# Patient Record
Sex: Female | Born: 1965 | Hispanic: No | Marital: Married | Smoking: Never smoker
Health system: Southern US, Community
[De-identification: ages and names within clinical notes are randomized; demographics above are authoritative.]

## PROBLEM LIST (undated history)

## (undated) DIAGNOSIS — N8500 Endometrial hyperplasia, unspecified: Secondary | ICD-10-CM

## (undated) DIAGNOSIS — J302 Other seasonal allergic rhinitis: Secondary | ICD-10-CM

## (undated) DIAGNOSIS — Z8742 Personal history of other diseases of the female genital tract: Secondary | ICD-10-CM

## (undated) DIAGNOSIS — O24419 Gestational diabetes mellitus in pregnancy, unspecified control: Secondary | ICD-10-CM

## (undated) DIAGNOSIS — Z803 Family history of malignant neoplasm of breast: Secondary | ICD-10-CM

## (undated) DIAGNOSIS — A184 Tuberculosis of skin and subcutaneous tissue: Secondary | ICD-10-CM

## (undated) HISTORY — DX: Tuberculosis of skin and subcutaneous tissue: A18.4

## (undated) HISTORY — DX: Family history of malignant neoplasm of breast: Z80.3

## (undated) HISTORY — DX: Personal history of other diseases of the female genital tract: Z87.42

## (undated) HISTORY — DX: Gestational diabetes mellitus in pregnancy, unspecified control: O24.419

## (undated) HISTORY — PX: DIAGNOSTIC LAPAROSCOPY: SUR761

## (undated) HISTORY — DX: Other seasonal allergic rhinitis: J30.2

## (undated) HISTORY — PX: TONSILLECTOMY: SUR1361

---

## 2003-06-24 ENCOUNTER — Encounter: Payer: Self-pay | Admitting: Internal Medicine

## 2004-02-05 ENCOUNTER — Ambulatory Visit: Payer: Self-pay | Admitting: Internal Medicine

## 2004-04-18 ENCOUNTER — Ambulatory Visit: Payer: Self-pay | Admitting: Internal Medicine

## 2004-10-04 ENCOUNTER — Encounter: Admission: RE | Admit: 2004-10-04 | Discharge: 2004-12-12 | Payer: Self-pay | Admitting: *Deleted

## 2004-12-11 ENCOUNTER — Inpatient Hospital Stay (HOSPITAL_COMMUNITY): Admission: AD | Admit: 2004-12-11 | Discharge: 2004-12-14 | Payer: Self-pay | Admitting: *Deleted

## 2005-02-15 ENCOUNTER — Ambulatory Visit: Payer: Self-pay | Admitting: Internal Medicine

## 2005-04-28 ENCOUNTER — Encounter: Admission: RE | Admit: 2005-04-28 | Discharge: 2005-04-28 | Payer: Self-pay | Admitting: Specialist

## 2005-05-05 ENCOUNTER — Ambulatory Visit: Payer: Self-pay | Admitting: Internal Medicine

## 2005-06-05 ENCOUNTER — Ambulatory Visit: Payer: Self-pay | Admitting: Internal Medicine

## 2005-10-04 ENCOUNTER — Ambulatory Visit: Payer: Self-pay | Admitting: Internal Medicine

## 2006-01-25 ENCOUNTER — Ambulatory Visit: Payer: Self-pay | Admitting: Internal Medicine

## 2006-02-24 ENCOUNTER — Emergency Department (HOSPITAL_COMMUNITY): Admission: EM | Admit: 2006-02-24 | Discharge: 2006-02-24 | Payer: Self-pay | Admitting: Emergency Medicine

## 2006-03-01 ENCOUNTER — Emergency Department (HOSPITAL_COMMUNITY): Admission: EM | Admit: 2006-03-01 | Discharge: 2006-03-01 | Payer: Self-pay | Admitting: Emergency Medicine

## 2006-08-22 ENCOUNTER — Encounter (INDEPENDENT_AMBULATORY_CARE_PROVIDER_SITE_OTHER): Payer: Self-pay | Admitting: Specialist

## 2006-08-22 ENCOUNTER — Inpatient Hospital Stay (HOSPITAL_COMMUNITY): Admission: AD | Admit: 2006-08-22 | Discharge: 2006-08-24 | Payer: Self-pay | Admitting: Obstetrics and Gynecology

## 2006-10-30 ENCOUNTER — Encounter: Payer: Self-pay | Admitting: Internal Medicine

## 2006-12-20 ENCOUNTER — Encounter: Payer: Self-pay | Admitting: Internal Medicine

## 2007-01-24 ENCOUNTER — Ambulatory Visit: Payer: Self-pay | Admitting: Internal Medicine

## 2007-09-23 ENCOUNTER — Ambulatory Visit (HOSPITAL_COMMUNITY): Admission: RE | Admit: 2007-09-23 | Discharge: 2007-09-23 | Payer: Self-pay | Admitting: Internal Medicine

## 2007-10-01 ENCOUNTER — Encounter: Admission: RE | Admit: 2007-10-01 | Discharge: 2007-10-01 | Payer: Self-pay | Admitting: Internal Medicine

## 2007-12-17 ENCOUNTER — Encounter: Payer: Self-pay | Admitting: Internal Medicine

## 2008-01-10 ENCOUNTER — Ambulatory Visit: Payer: Self-pay | Admitting: Internal Medicine

## 2008-01-15 ENCOUNTER — Telehealth: Payer: Self-pay | Admitting: Internal Medicine

## 2008-02-17 ENCOUNTER — Ambulatory Visit: Payer: Self-pay | Admitting: Internal Medicine

## 2008-02-17 DIAGNOSIS — R51 Headache: Secondary | ICD-10-CM

## 2008-02-17 DIAGNOSIS — R519 Headache, unspecified: Secondary | ICD-10-CM | POA: Insufficient documentation

## 2008-02-17 DIAGNOSIS — J309 Allergic rhinitis, unspecified: Secondary | ICD-10-CM | POA: Insufficient documentation

## 2008-02-17 DIAGNOSIS — N809 Endometriosis, unspecified: Secondary | ICD-10-CM | POA: Insufficient documentation

## 2008-02-17 DIAGNOSIS — Z8632 Personal history of gestational diabetes: Secondary | ICD-10-CM

## 2008-03-30 ENCOUNTER — Ambulatory Visit: Payer: Self-pay | Admitting: Internal Medicine

## 2008-03-30 DIAGNOSIS — K089 Disorder of teeth and supporting structures, unspecified: Secondary | ICD-10-CM | POA: Insufficient documentation

## 2008-03-30 DIAGNOSIS — J019 Acute sinusitis, unspecified: Secondary | ICD-10-CM

## 2008-03-30 DIAGNOSIS — J069 Acute upper respiratory infection, unspecified: Secondary | ICD-10-CM | POA: Insufficient documentation

## 2008-08-18 ENCOUNTER — Ambulatory Visit: Payer: Self-pay | Admitting: Internal Medicine

## 2008-11-05 ENCOUNTER — Encounter: Admission: RE | Admit: 2008-11-05 | Discharge: 2008-11-05 | Payer: Self-pay | Admitting: Internal Medicine

## 2009-01-12 ENCOUNTER — Ambulatory Visit: Payer: Self-pay | Admitting: Internal Medicine

## 2009-08-02 ENCOUNTER — Ambulatory Visit: Payer: Self-pay | Admitting: Internal Medicine

## 2010-01-19 ENCOUNTER — Encounter: Admission: RE | Admit: 2010-01-19 | Discharge: 2010-01-19 | Payer: Self-pay | Admitting: Obstetrics and Gynecology

## 2010-05-01 ENCOUNTER — Encounter: Payer: Self-pay | Admitting: Internal Medicine

## 2010-05-10 NOTE — Assessment & Plan Note (Signed)
Summary: allergies/ssc   Vital Signs:  Patient profile:   45 year old female Menstrual status:  regular LMP:     07/02/2009 Height:      69 inches Weight:      107.5 pounds BMI:     15.93 Temp:     98.7 degrees F oral Pulse rate:   78 / minute BP sitting:   100 / 60  (right arm) Cuff size:   regular  Vitals Entered By: Romualdo Bolk, CMA Duncan Dull) (August 02, 2009 3:41 PM) CC: Allergies- Sneezing, watery eyes LMP (date): 07/02/2009 LMP - Character: normal Menarche (age onset years): 15   Menses interval (days): 30 Enter LMP: 07/02/2009   History of Present Illness: Megan Armstrong comes in today  for  problems with allergies .     Sig upper resp problems  itchy and congestion and worse when outside.  tis spring.   Taking claritin  otc.   Hasnt taken nasonex since   in    [place].    Until called in  upon phone request and already seems to be  improved .    NOt too much coughing .    No asthma  Preventive Screening-Counseling & Management  Alcohol-Tobacco     Alcohol drinks/day: 0     Smoking Status: never  Caffeine-Diet-Exercise     Caffeine use/day: 1     Does Patient Exercise: yes  Current Medications (verified): 1)  Nasonex 50 Mcg/act Susp (Mometasone Furoate) .... 2 Sprays Each Nostril   Q D 2)  Claritin Reditabs 10 Mg Tbdp (Loratadine)  Allergies (verified): 1)  ! Pcn 2)  ! Tylenol 3)  ! Codeine  Past History:  Past medical, surgical, family and social histories (including risk factors) reviewed for relevance to current acute and chronic problems.  Past Medical History: Reviewed history from 03/30/2008 and no changes required. Endometriosis Allergic rhinitis Allergies Gest DM +TB Skin Test Borderline spirometry    Past Surgical History: Reviewed history from 01/03/2007 and no changes required. Tonsillectomy  Past History:  Care Management: Gynecology: [name]  Family History: Reviewed history from 03/30/2008 and no changes  required. Family History of Asthma Family History Diabetes 1st degree relative Family History Hypertension Family History of Sudden Death sister with migraines   Social History: Reviewed history from 03/30/2008 and no changes required. Married Never Smoked Alcohol use-no Drug use-no Regular exercise-yes decff tea        some coke.      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    Physical Exam  General:  alert, well-developed, well-nourished, and well-hydrated.   Head:  normocephalic and atraumatic.   Eyes:  vision grossly intact, pupils equal, and pupils round.   Ears:  R ear normal and no external deformities.   Nose:  no external deformity and no external erythema.  mild congestion Mouth:  pharynx pink and moist.   Neck:  No deformities, masses, or tenderness noted. Lungs:  Normal respiratory effort, chest expands symmetrically. Lungs are clear to auscultation, no crackles or wheezes.no dullness.   Heart:  Normal rate and regular rhythm. S1 and S2 normal without gallop, murmur, click, rub or other extra sounds. Abdomen:  Bowel sounds positive,abdomen soft and non-tender without masses, organomegaly  or   noted. Extremities:  no clubbing cyanosis or edema  Neurologic:  non focal  Skin:  turgor normal, color normal, no ecchymoses, and no petechiae.   Cervical Nodes:  No lymphadenopathy noted Psych:  Oriented X3, good eye contact, not anxious appearing, and not depressed appearing.     Impression & Recommendations:  Problem # 1:  ALLERGIC RHINITIS (ICD-477.9)  seasonal  improved with above.  continue    .    Her updated medication list for this problem includes:    Nasonex 50 Mcg/act Susp  (Mometasone furoate) .Marland Kitchen... 2 sprays each nostril   q d    Claritin Reditabs 10 Mg Tbdp (Loratadine)  Complete Medication List: 1)  Nasonex 50 Mcg/act Susp (Mometasone furoate) .... 2 sprays each nostril   q d 2)  Claritin Reditabs 10 Mg Tbdp (Loratadine)  Patient Instructions: 1)  continue rx  call  if  needed or as needed 1 year .   Prescriptions: CLARITIN REDITABS 10 MG TBDP (LORATADINE)   #30 x 6   Entered and Authorized by:   Madelin Headings MD   Signed by:   Madelin Headings MD on 08/02/2009   Method used:   Print then Give to Patient   RxID:   [id number] NASONEX 50 MCG/ACT SUSP (MOMETASONE FUROATE) 2 sprays each nostril   q d  #17 [place] x 12   Entered and Authorized by:   Madelin Headings MD   Signed by:   Madelin Headings MD on 08/02/2009   Method used:   Print then Give to Patient   RxID:   [id number]

## 2010-08-26 NOTE — Assessment & Plan Note (Signed)
[facility]                                 ON-CALL NOTE   Megan Armstrong, Megan Armstrong                     MRN:          [id number]  DATE:04/01/2006                            DOB:          [birth year]    PHONE NUMBER:  [phone]   PATIENT DOCTOR:  Dr. Fabian Sharp.   The phone call was at about 9:10 a.m. on December 23.  She is [redacted] weeks  pregnant and has been having a cold for the last 4 days herself.  She  has been just taking some Benadryl.  Now she is feeling a little bit  worse, she has had a low grade fever about 100.4.  She is also coughing  up some yellow stuff, she is not really short of breath.   PLAN:  I told her that we would need to have an evaluation before any  treatment could be started, she understood that.  She was not sure she  needed that today.  I told her an urgent care would be appropriate for  today if she could not wait until office hours tomorrow.     Karie Schwalbe, MD  Electronically Signed    RIL/MedQ  DD: 04/01/2006  DT: 04/01/2006  Job #: [id number]

## 2010-11-07 ENCOUNTER — Ambulatory Visit (INDEPENDENT_AMBULATORY_CARE_PROVIDER_SITE_OTHER)
Admission: RE | Admit: 2010-11-07 | Discharge: 2010-11-07 | Disposition: A | Discharge status: Home / Self Care | Payer: Managed Care, Other (non HMO) | Source: Ambulatory Visit | Attending: Internal Medicine | Admitting: Internal Medicine

## 2010-11-07 ENCOUNTER — Encounter: Payer: Self-pay | Admitting: Internal Medicine

## 2010-11-07 ENCOUNTER — Telehealth: Payer: Self-pay | Admitting: *Deleted

## 2010-11-07 ENCOUNTER — Ambulatory Visit (INDEPENDENT_AMBULATORY_CARE_PROVIDER_SITE_OTHER): Payer: Managed Care, Other (non HMO) | Admitting: Internal Medicine

## 2010-11-07 DIAGNOSIS — R509 Fever, unspecified: Secondary | ICD-10-CM

## 2010-11-07 DIAGNOSIS — R05 Cough: Secondary | ICD-10-CM | POA: Insufficient documentation

## 2010-11-07 LAB — CBC WITH DIFFERENTIAL/PLATELET
Basophils Absolute: 0 10*3/uL (ref 0.0–0.1)
Eosinophils Absolute: 0 10*3/uL (ref 0.0–0.7)
Hemoglobin: 13.6 g/dL (ref 12.0–15.0)
Lymphocytes Relative: 26.3 % (ref 12.0–46.0)
Lymphs Abs: 2.1 10*3/uL (ref 0.7–4.0)
MCHC: 30.8 g/dL (ref 30.0–36.0)
Neutro Abs: 5 10*3/uL (ref 1.4–7.7)
Platelets: 290 10*3/uL (ref 150.0–400.0)
RDW: 13.6 % (ref 11.5–14.6)

## 2010-11-07 MED ORDER — DOXYCYCLINE HYCLATE 100 MG PO TABS: 100.0000 mg | ORAL_TABLET | Freq: Two times a day (BID) | ORAL | Status: AC

## 2010-11-07 NOTE — Telephone Encounter (Signed)
Left message to call back  

## 2010-11-07 NOTE — Telephone Encounter (Signed)
Message copied by Romualdo Bolk on Mon Nov 07, 2010  5:18 PM ------      Message from: [facility], [place] K      Created: Mon Nov 07, 2010  5:13 PM       Tell patient x ray is normal

## 2010-11-07 NOTE — Patient Instructions (Signed)
Get xray  To r/o pneumonia. Begin DOXY  To cover  atypicals and tick related disease. Expect fever    To resolve in  48 hours.

## 2010-11-07 NOTE — Telephone Encounter (Signed)
Pt called after hr ans service - informed pt xray normal per dr. Fabian Sharp

## 2010-11-07 NOTE — Progress Notes (Signed)
  Subjective:    Patient ID: Megan Armstrong, female    DOB: 11-Apr-1965, 45 y.o.   MRN: [id number]  HPI Comesin with husband today because of acute febrile iless. Onset  As above onset with HA 7 26 then fever and midl dry cough. No travel except  [facility].      Onset with HA and worse with bending and up   And cough dry. And thenito front and then fever. had to go to bed with  Myalgias   and bed rest . Chills no rigors Slight PND.  Queasiness no vomiting and stool slimy green solid. Foul smelling.  No rashes  No joint swelling.   Took ibu  And advil migraine.  No Ha now.     No  Tick bite known or rash  Review of Systems    Neg cp sob vision change e adenopathy VD syncope   Past history family history social history reviewed in the electronic medical record.  Objective:   Physical Exam WDWN in nad midly ill non toxic  HEENT: Normocephalic ;atraumatic , Eyes;  PERRL, EOMs  Full, lids and conjunctiva clear,,Ears: no deformities, canals nl, TM landmarks normal, Nose: no deformity or discharge  Mild congestion Mouth : OP clear without lesion or edema . Neck supple no sig adenopathy. Chest:  Clear to A&P without wheezes rales or rhonchi CV:  S1-S2 no gallops or murmurs peripheral perfusion is normal Abdomen:  Sof,t normal bowel sounds without hepatosplenomegaly, no guarding rebound or masses no CVA tenderness No clubbing cyanosis or edema No acute rash nl cap refill Neuro non focal.      Assessment & Plan:  Fever ha and resp sx   Poss entero/adeno  virus other  Check chest x ray  and cbc and cover for tick related  Disease cause is day 3 of fever.     Expectant management. And relate labs when back.Marland Kitchen

## 2010-11-08 ENCOUNTER — Telehealth: Payer: Self-pay | Admitting: *Deleted

## 2010-11-08 NOTE — Telephone Encounter (Signed)
Pt aware of results 

## 2010-11-08 NOTE — Telephone Encounter (Signed)
Message copied by Romualdo Bolk on Tue Nov 08, 2010  2:01 PM ------      Message from: [facility], [place] K      Created: Tue Nov 08, 2010 12:48 PM       Call pt with nl cbc results

## 2010-11-08 NOTE — Telephone Encounter (Signed)
Left message to call back  

## 2010-11-10 ENCOUNTER — Encounter: Payer: Self-pay | Admitting: Internal Medicine

## 2011-02-07 ENCOUNTER — Ambulatory Visit (INDEPENDENT_AMBULATORY_CARE_PROVIDER_SITE_OTHER): Payer: Managed Care, Other (non HMO) | Admitting: Internal Medicine

## 2011-02-07 DIAGNOSIS — Z23 Encounter for immunization: Secondary | ICD-10-CM

## 2011-02-27 ENCOUNTER — Other Ambulatory Visit: Payer: Self-pay | Admitting: Obstetrics and Gynecology

## 2011-02-27 DIAGNOSIS — Z1231 Encounter for screening mammogram for malignant neoplasm of breast: Secondary | ICD-10-CM

## 2011-03-23 ENCOUNTER — Ambulatory Visit: Payer: Managed Care, Other (non HMO)

## 2011-03-31 ENCOUNTER — Ambulatory Visit
Admission: RE | Admit: 2011-03-31 | Discharge: 2011-03-31 | Disposition: A | Discharge status: Home / Self Care | Payer: Commercial Indemnity | Source: Ambulatory Visit | Attending: Obstetrics and Gynecology | Admitting: Obstetrics and Gynecology

## 2011-03-31 DIAGNOSIS — Z1231 Encounter for screening mammogram for malignant neoplasm of breast: Secondary | ICD-10-CM

## 2011-05-03 ENCOUNTER — Encounter: Payer: Self-pay | Admitting: Internal Medicine

## 2011-05-03 ENCOUNTER — Ambulatory Visit (INDEPENDENT_AMBULATORY_CARE_PROVIDER_SITE_OTHER): Payer: Commercial Indemnity | Admitting: Internal Medicine

## 2011-05-03 VITALS — BP 100/70 | Temp 97.6°F | Wt 107.0 lb

## 2011-05-03 DIAGNOSIS — J019 Acute sinusitis, unspecified: Secondary | ICD-10-CM

## 2011-05-03 DIAGNOSIS — Z881 Allergy status to other antibiotic agents status: Secondary | ICD-10-CM | POA: Insufficient documentation

## 2011-05-03 MED ORDER — AZITHROMYCIN 250 MG PO TABS: 250.0000 mg | ORAL_TABLET | ORAL | Status: AC

## 2011-05-03 NOTE — Progress Notes (Signed)
  Subjective:    Patient ID: Megan Armstrong, female    DOB: [birth year], 46 y.o.   MRN: [id number]  HPI Patient comes in today for SDA  For acute problem evaluation. Onset   With diarrhea  On Jan 14th and then took immodium abnd then sore throat and cough and  Bad head pressure.   like sinusitis very thick discolored nasal discharged.   No fever  But sweats  At times .   No cp  Poss wheeze a few days. Ago.  Pain under  eyes and  Jaw. Teeth hurt    Decongestant daquil  and vaporizer and minimal helps.   No cp sob   But over all not getting better  Stayed in bed today.   diarrhea some better . No recent travel.   Review of Systems No NV no unusual rashes  m cant take pcn and cephalo for se rash ans swellings rest of ros neg or [place]  See hpi   Past history family history social history reviewed in the electronic medical record.     Objective:   Physical Exam WDWN in NAD  quiet respirations; very  congested  somewhat hoarse. Non toxic . HEENT: Normocephalic ;atraumatic , Eyes;  PERRL, EOMs  Full, lids and conjunctiva clear,,Ears: no deformities, canals nl, TM landmarks normal, Nose: no deformity mucoid discharge ;face somewhat  Maxillary  tender Mouth : OP clear without lesion or edema . Neck: Supple without adenopathy or masses or bruits Chest:  Clear to A&P without wheezes rales or rhonchi CV:  S1-S2 no gallops or murmurs peripheral perfusion is normal Skin :nl perfusion and no acute rashes       Assessment & Plan:  URI complicated sinusitis  options discussed can add afrin and if face pain  Continues  Add antibiotic   Azithromycin is not first line but all to pcn and cephalos   And prefers to avoid broad spectrum quiialones if possible.

## 2011-05-03 NOTE — Patient Instructions (Addendum)
Continue  Saline  And try decongestants   Afrin short term for pain and congestion.  Can add antibiotic   Expect improvement in 3-5 days or less   Sinusitis Sinuses are air pockets within the bones of your face. The growth of bacteria within a sinus leads to infection. The infection prevents the sinuses from draining. This infection is called sinusitis. SYMPTOMS  There will be different areas of pain depending on which sinuses have become infected.  The maxillary sinuses often produce pain beneath the eyes.   Frontal sinusitis may cause pain in the middle of the forehead and above the eyes.  Other problems (symptoms) include:  Toothaches.   Colored, pus-like (purulent) drainage from the nose.   Swelling, warmth, and tenderness over the sinus areas may be signs of infection.  TREATMENT  Sinusitis is most often determined by an exam.X-rays may be taken. If x-rays have been taken, make sure you obtain your results or find out how you are to obtain them. Your caregiver may give you medications (antibiotics). These are medications that will help kill the bacteria causing the infection. You may also be given a medication (decongestant) that helps to reduce sinus swelling.  HOME CARE INSTRUCTIONS   Only take over-the-counter or prescription medicines for pain, discomfort, or fever as directed by your caregiver.   Drink extra fluids. Fluids help thin the mucus so your sinuses can drain more easily.   Applying either moist heat or ice packs to the sinus areas may help relieve discomfort.   Use saline nasal sprays to help moisten your sinuses. The sprays can be found at your local drugstore.  SEEK IMMEDIATE MEDICAL CARE IF:  You have a fever.   You have increasing pain, severe headaches, or toothache.   You have nausea, vomiting, or drowsiness.   You develop unusual swelling around the face or trouble seeing.  MAKE SURE YOU:   Understand these instructions.   Will watch your  condition.   Will get help right away if you are not doing well or get worse.  Document Released: 03/27/2005 Document Revised: 12/07/2010 Document Reviewed: 10/24/2006 [facility] Patient Information 2012 [place], [place].

## 2011-08-14 ENCOUNTER — Ambulatory Visit (INDEPENDENT_AMBULATORY_CARE_PROVIDER_SITE_OTHER): Payer: Commercial Indemnity | Admitting: Internal Medicine

## 2011-08-14 ENCOUNTER — Encounter: Payer: Self-pay | Admitting: Internal Medicine

## 2011-08-14 VITALS — BP 100/74 | HR 94 | Temp 98.3°F | Wt 111.0 lb

## 2011-08-14 DIAGNOSIS — J329 Chronic sinusitis, unspecified: Secondary | ICD-10-CM

## 2011-08-14 DIAGNOSIS — H6590 Unspecified nonsuppurative otitis media, unspecified ear: Secondary | ICD-10-CM

## 2011-08-14 DIAGNOSIS — J309 Allergic rhinitis, unspecified: Secondary | ICD-10-CM

## 2011-08-14 DIAGNOSIS — J069 Acute upper respiratory infection, unspecified: Secondary | ICD-10-CM

## 2011-08-14 DIAGNOSIS — H6592 Unspecified nonsuppurative otitis media, left ear: Secondary | ICD-10-CM

## 2011-08-14 MED ORDER — AZITHROMYCIN 250 MG PO TABS: 250.0000 mg | ORAL_TABLET | ORAL | Status: AC

## 2011-08-14 NOTE — Progress Notes (Signed)
  Subjective:    Patient ID: Megan Armstrong, female    DOB: [birth year], 46 y.o.   MRN: [id number]  HPI  Patient comes in today for SDA for  new problem evaluation. She is here with her 2 daughters who are also been ill. They were L. first and then she developed her symptoms. Over the last 4 days or so she hounds a head cold type symptom with nasal congestion cough fever sore throat and sinus pressure in the face and some left ear pain that comes and goes. And she gets up in the morning her eyes have some discharge slightly red. No vision changes or pain air. One of her children's has some of the same symptoms and has had a respiratory infection for least a week the other one is half fever that has been persistent with a cough.  Review of Systems No chest pain shortness of breath or wheezing.  History of sinusitis and allergies family history of severe penicillin allergy and she has had what sounds like an anaphylactic reaction to penicillin has not taken a cephalosporin because of concern she could have her cross-reaction. Past history family history social history reviewed in the electronic medical record.     Objective:   Physical Exam BP 100/74  Pulse 94  Temp(Src) 98.3 F (36.8 C) (Oral)  Wt 111 lb (50.349 kg)  SpO2 97%  LMP 07/15/2011 Well-developed well-nourished in no acute distress with some obvious faint pinkness on both conjunctiva but no swelling or discharge. Normocephalic TMs right normal left breath and some dusky fluid inferiorly but bony landmarks are normal. Face is tender bilaterally in maxillary area. Nares congested bilaterally OP clear with drainage tracks neck supple without significant adenopathy supple Chest:  Clear to A&P without wheezes rales or rhonchi CV:  S1-S2 no gallops or murmurs peripheral perfusion is normal  No clubbing cyanosis or edema     Assessment & Plan:   Acute upper respiratory infection with underlying allergy  primary illness acts viral  with context possible adenovirus etc. however she does have Secondary left otitis media with probable sinusitis.  History of anaphylaxis with penicillin prefers not to take a cephalosporin.  Azithromycin treatment given continue saline decongestant treatment and expectant management. If she is a treatment failure may have to go on to a quinolone.

## 2011-08-14 NOTE — Patient Instructions (Signed)
Antibiotic for left otitis ear infection and possible sinusitis. Warm compresses to eyes. I think the primary illness is viral however. Expect significant improvement in the ear and sinuses in 3-5 days the cough may progress and he may have this for couple weeks.

## 2011-08-16 DIAGNOSIS — H6592 Unspecified nonsuppurative otitis media, left ear: Secondary | ICD-10-CM | POA: Insufficient documentation

## 2011-12-29 ENCOUNTER — Ambulatory Visit (INDEPENDENT_AMBULATORY_CARE_PROVIDER_SITE_OTHER): Payer: Commercial Indemnity

## 2011-12-29 DIAGNOSIS — Z23 Encounter for immunization: Secondary | ICD-10-CM

## 2012-04-22 ENCOUNTER — Other Ambulatory Visit: Payer: Self-pay | Admitting: Obstetrics and Gynecology

## 2012-04-22 DIAGNOSIS — Z1231 Encounter for screening mammogram for malignant neoplasm of breast: Secondary | ICD-10-CM

## 2012-05-22 ENCOUNTER — Ambulatory Visit
Admission: RE | Admit: 2012-05-22 | Discharge: 2012-05-22 | Disposition: A | Discharge status: Home / Self Care | Payer: Commercial Indemnity | Source: Ambulatory Visit | Attending: Obstetrics and Gynecology | Admitting: Obstetrics and Gynecology

## 2012-05-22 DIAGNOSIS — Z1231 Encounter for screening mammogram for malignant neoplasm of breast: Secondary | ICD-10-CM

## 2012-09-24 ENCOUNTER — Ambulatory Visit (INDEPENDENT_AMBULATORY_CARE_PROVIDER_SITE_OTHER): Payer: Commercial Indemnity | Admitting: Internal Medicine

## 2012-09-24 ENCOUNTER — Encounter: Payer: Self-pay | Admitting: Internal Medicine

## 2012-09-24 ENCOUNTER — Ambulatory Visit: Payer: Commercial Indemnity | Admitting: Internal Medicine

## 2012-09-24 VITALS — BP 102/68 | HR 87 | Temp 98.4°F | Wt 110.0 lb

## 2012-09-24 DIAGNOSIS — Z2089 Contact with and (suspected) exposure to other communicable diseases: Secondary | ICD-10-CM

## 2012-09-24 DIAGNOSIS — J029 Acute pharyngitis, unspecified: Secondary | ICD-10-CM

## 2012-09-24 DIAGNOSIS — Z880 Allergy status to penicillin: Secondary | ICD-10-CM

## 2012-09-24 DIAGNOSIS — L710 Perioral dermatitis: Secondary | ICD-10-CM

## 2012-09-24 DIAGNOSIS — Z20818 Contact with and (suspected) exposure to other bacterial communicable diseases: Secondary | ICD-10-CM

## 2012-09-24 DIAGNOSIS — L719 Rosacea, unspecified: Secondary | ICD-10-CM

## 2012-09-24 MED ORDER — AZITHROMYCIN 500 MG PO TABS: 500.0000 mg | ORAL_TABLET | Freq: Every day | ORAL | Status: DC

## 2012-09-24 NOTE — Patient Instructions (Addendum)
Consider strep   The culture takes days to come back.  Stop any steroid use on the face and it will get worse and then hooefuylly better  This could be perioral dermatis that sometimes responds to doxycycline if needed. Call if  persistent or progressive

## 2012-09-24 NOTE — Progress Notes (Signed)
Chief Complaint  Patient presents with  . Hoarse    Cough is normally at night.  Started on Sunday.  Has had 2 cases of strep throat in the house.  . Sore Throat  . Cough    HPI: Patient comes in today for SDA for  new problem evaluation. Onset  Over the last  3 days of hoarse and sore throat to take less to swallow without sig fever congestion or sig cough ( minor although present at night ). Daughters had strep throat one pos tests other empirically rx  .  Comes in for evaluation .  Rash nasolabial rea and chin  Using 1 hcs some effect but persisting ? Cause other management.   ROS: See pertinent positives and negatives per HPI. To go to [facility] in July  Allergic to pcn and keflex  Rash allergy Past Medical History  Diagnosis Date  . Endometriosis   . Allergic rhinitis   . Seasonal allergies   . DM mellitus, gestational   . TB skin/subcutaneous     postive tb skin test    Family History  Problem Relation Age of Onset  . Migraines Sister   . Asthma Other   . Diabetes Other   . Hypertension Other   . Sudden death Other     History   Social History  . Marital Status: Married    Spouse Name: N/A    Number of Children: N/A  . Years of Education: N/A   Social History Main Topics  . Smoking status: Never Smoker   . Smokeless tobacco: None  . Alcohol Use: No  . Drug Use: No  . Sexually Active: None   Other Topics Concern  . None   Social History Narrative   Borderline spirometry   Married   Regular exercise   decff tea, some coke   2 children at home   Pharmacy school internship   Family form [place]    Outpatient Encounter Prescriptions as of 09/24/2012  Medication Sig Dispense Refill  . loratadine (CLARITIN REDITABS) 10 MG dissolvable tablet Take 10 mg by mouth daily.        . mometasone (NASONEX) 50 MCG/ACT nasal spray Place 2 sprays into the nose daily.        Marland Kitchen azithromycin (ZITHROMAX) 500 MG tablet Take 1 tablet (500 mg total) by mouth daily.  5  tablet  0   No facility-administered encounter medications on file as of 09/24/2012.    EXAM:  BP 102/68  Pulse 87  Temp(Src) 98.4 F (36.9 C) (Oral)  Wt 110 lb (49.896 kg)  BMI 16.24 kg/m2  SpO2 98%  LMP 09/07/2012  Body mass index is 16.24 kg/(m^2).  GENERAL: vitals reviewed and listed above, alert, oriented, appears well hydrated and in no acute distress hoarse  HEENT: atraumatic, conjunctiva  clear, no obvious abnormalities on inspection of external nose and ears OP : no lesion edema or exudate  Red  No lesion  NECK: no obvious masses on inspection palpation  Tender ac area but not enlarged face non tender LUNGS: clear to auscultation bilaterally, no wheezes, rales or rhonchi, good air movement CV: HRRR, no clubbing cyanosis or  peripheral edema nl cap refill  Skin : perioral papules  and  Hypopigment near nasal labial fold  no scaling  MS: moves all extremities without noticeable focal  abnormality PSYCH: pleasant and cooperative, no obvious depression or anxiety  ASSESSMENT AND PLAN:  Discussed the following assessment and plan:  Sore  throat - viral vs strep  high risk exposures - Plan: POC Rapid Strep A, Throat culture ([facility])  Perioral dermatitis  Exposure to strep throat - children  treated recently.   History of penicillin allergy - rash with keflex. can take erytho not clinda Poss viral  Can wait for tcx to be done   If needed take higher dose azithro cause of risk of resistance and dosing,  Consider rx with doxy if perioral rash  persistent or progressive  -Patient advised to return or notify health care team  if symptoms worsen or persist or new concerns arise.  Patient Instructions  Consider strep   The culture takes days to come back.  Stop any steroid use on the face and it will get worse and then hooefuylly better  This could be perioral dermatis that sometimes responds to doxycycline if needed. Call if  persistent or progressive    Neta Mends. Panosh  M.D.

## 2012-09-26 ENCOUNTER — Telehealth: Payer: Self-pay | Admitting: Family Medicine

## 2012-09-26 MED ORDER — DOXYCYCLINE HYCLATE 100 MG PO CAPS: 100.0000 mg | ORAL_CAPSULE | Freq: Two times a day (BID) | ORAL | Status: DC

## 2012-09-26 NOTE — Telephone Encounter (Signed)
Patient notified by telephone. 

## 2012-09-26 NOTE — Telephone Encounter (Signed)
Sent in  To her pharmacy.

## 2012-09-26 NOTE — Telephone Encounter (Signed)
Patient called and left a message on my voicemail informing me that her dermatitis has flared.  She would like the doxycycline sent to the pharmacy.  Please advise.  Thanks!!

## 2012-09-27 ENCOUNTER — Encounter: Payer: Self-pay | Admitting: Family Medicine

## 2013-01-20 ENCOUNTER — Ambulatory Visit (INDEPENDENT_AMBULATORY_CARE_PROVIDER_SITE_OTHER): Payer: Commercial Indemnity | Admitting: Family Medicine

## 2013-01-20 DIAGNOSIS — Z23 Encounter for immunization: Secondary | ICD-10-CM

## 2013-08-06 ENCOUNTER — Other Ambulatory Visit: Payer: Self-pay

## 2013-08-06 DIAGNOSIS — Z1231 Encounter for screening mammogram for malignant neoplasm of breast: Secondary | ICD-10-CM

## 2013-08-22 ENCOUNTER — Ambulatory Visit
Admission: RE | Admit: 2013-08-22 | Discharge: 2013-08-22 | Disposition: A | Discharge status: Home / Self Care | Payer: Commercial Indemnity | Source: Ambulatory Visit

## 2013-08-22 ENCOUNTER — Encounter (INDEPENDENT_AMBULATORY_CARE_PROVIDER_SITE_OTHER): Payer: Self-pay

## 2013-08-22 DIAGNOSIS — Z1231 Encounter for screening mammogram for malignant neoplasm of breast: Secondary | ICD-10-CM

## 2014-01-06 ENCOUNTER — Ambulatory Visit (INDEPENDENT_AMBULATORY_CARE_PROVIDER_SITE_OTHER): Payer: Commercial Indemnity

## 2014-01-06 DIAGNOSIS — Z23 Encounter for immunization: Secondary | ICD-10-CM

## 2014-02-04 ENCOUNTER — Encounter: Payer: Self-pay | Admitting: Internal Medicine

## 2014-02-04 ENCOUNTER — Ambulatory Visit (INDEPENDENT_AMBULATORY_CARE_PROVIDER_SITE_OTHER): Payer: Commercial Indemnity | Admitting: Internal Medicine

## 2014-02-04 VITALS — BP 104/70 | Temp 98.9°F | Ht 62.25 in | Wt 108.0 lb

## 2014-02-04 DIAGNOSIS — Z0000 Encounter for general adult medical examination without abnormal findings: Secondary | ICD-10-CM

## 2014-02-04 DIAGNOSIS — Z23 Encounter for immunization: Secondary | ICD-10-CM

## 2014-02-04 DIAGNOSIS — Z8632 Personal history of gestational diabetes: Secondary | ICD-10-CM

## 2014-02-04 LAB — CBC WITH DIFFERENTIAL/PLATELET
BASOS PCT: 0.4 % (ref 0.0–3.0)
Basophils Absolute: 0 10*3/uL (ref 0.0–0.1)
EOS PCT: 1.1 % (ref 0.0–5.0)
Eosinophils Absolute: 0.1 10*3/uL (ref 0.0–0.7)
HEMATOCRIT: 39.1 % (ref 36.0–46.0)
HEMOGLOBIN: 12.9 g/dL (ref 12.0–15.0)
LYMPHS ABS: 2.1 10*3/uL (ref 0.7–4.0)
Lymphocytes Relative: 25.5 % (ref 12.0–46.0)
MCHC: 33 g/dL (ref 30.0–36.0)
MCV: 85.9 fl (ref 78.0–100.0)
MONO ABS: 0.5 10*3/uL (ref 0.1–1.0)
MONOS PCT: 6.3 % (ref 3.0–12.0)
NEUTROS ABS: 5.5 10*3/uL (ref 1.4–7.7)
Neutrophils Relative %: 66.7 % (ref 43.0–77.0)
Platelets: 258 10*3/uL (ref 150.0–400.0)
RBC: 4.55 Mil/uL (ref 3.87–5.11)
RDW: 14.3 % (ref 11.5–15.5)
WBC: 8.2 10*3/uL (ref 4.0–10.5)

## 2014-02-04 LAB — LIPID PANEL
CHOLESTEROL: 185 mg/dL (ref 0–200)
HDL: 40 mg/dL (ref >39.00)
LDL Cholesterol: 117 mg/dL — ABNORMAL HIGH (ref 0–99)
NONHDL: 145
Total CHOL/HDL Ratio: 5
Triglycerides: 138 mg/dL (ref 0.0–149.0)
VLDL: 27.6 mg/dL (ref 0.0–40.0)

## 2014-02-04 LAB — BASIC METABOLIC PANEL
BUN: 15 mg/dL (ref 6–23)
CHLORIDE: 107 meq/L (ref 96–112)
CO2: 24 mEq/L (ref 19–32)
Calcium: 9 mg/dL (ref 8.4–10.5)
Creatinine, Ser: 0.9 mg/dL (ref 0.4–1.2)
GFR: 73.78 mL/min (ref >60.00)
Glucose, Bld: 89 mg/dL (ref 70–99)
POTASSIUM: 4.9 meq/L (ref 3.5–5.1)
SODIUM: 137 meq/L (ref 135–145)

## 2014-02-04 LAB — HEPATIC FUNCTION PANEL
ALBUMIN: 3.5 g/dL (ref 3.5–5.2)
ALK PHOS: 43 U/L (ref 39–117)
ALT: 11 U/L (ref 0–35)
AST: 18 U/L (ref 0–37)
Bilirubin, Direct: 0 mg/dL (ref 0.0–0.3)
Total Bilirubin: 0.5 mg/dL (ref 0.2–1.2)
Total Protein: 7.2 g/dL (ref 6.0–8.3)

## 2014-02-04 LAB — TSH: TSH: 1.12 u[IU]/mL (ref 0.35–4.50)

## 2014-02-04 LAB — HEMOGLOBIN A1C: Hgb A1c MFr Bld: 5.3 % (ref 4.6–6.5)

## 2014-02-04 NOTE — Progress Notes (Signed)
Pre visit review using our clinic review tool, if applicable. No additional management support is needed unless otherwise documented below in the visit note.  Chief Complaint  Patient presents with  . Annual Exam    HPI: Patient comes in today for Preventive Health Care visit  Periods regular  Pms physical sx.  Last 5 days  See gyne  Hx gest dm not fasting eg toast and tea a sugar .  Controls seasonal allergy  Health Maintenance  Topic Date Due  . Influenza Vaccine  11/09/2014  . Pap Smear  03/10/2016  . Tetanus/tdap  02/05/2024   Health Maintenance Review LIFESTYLE:  Exercise:  Walking hose  Exercise . Tobacco/ETS:  no Alcohol: none  Sugar beverages:  4 pm soda .  100 calories  Sleep:  6-8 hours  Drug use: no PAP:   12 /14  mammo  Yes    [facility]  12 ;14 . Has dense breasts   ROS:  GEN/ HEENT: No fever, significant weight changes sweats headaches vision problems hearing changes, CV/ PULM; No chest pain shortness of breath cough, syncope,edema  change in exercise tolerance. GI /GU: No adominal pain, vomiting, change in bowel habits. No blood in the stool. No significant GU symptoms. SKIN/HEME: ,no acute skin rashes suspicious lesions or bleeding. No lymphadenopathy, nodules, masses.  NEURO/ PSYCH:  No neurologic signs such as weakness numbness. No depression anxiety. IMM/ Allergy: No unusual infections.  Allergy .   REST of 12 system review negative except as per HPI   Past Medical History  Diagnosis Date  . Endometriosis   . Allergic rhinitis   . Seasonal allergies   . DM mellitus, gestational   . TB skin/subcutaneous     postive tb skin test  . Hx of endometriosis     rectal vag with laser surgery     Family History  Problem Relation Age of Onset  . Migraines Sister   . Asthma Other   . Diabetes Other   . Hypertension Other   . Sudden death Other     History   Social History  . Marital Status: Married    Spouse Name: N/A    Number of  Children: N/A  . Years of Education: N/A   Social History Main Topics  . Smoking status: Never Smoker   . Smokeless tobacco: None  . Alcohol Use: No  . Drug Use: No  . Sexual Activity: None   Other Topics Concern  . None   Social History Narrative   Borderline spirometry   Married   Regular exercise   decff tea, some coke   2 children at home hh of 4    Pharmacy school internship   Family form [place]    Outpatient Encounter Prescriptions as of 02/04/2014  Medication Sig  . loratadine (CLARITIN REDITABS) 10 MG dissolvable tablet Take 10 mg by mouth daily.    . mometasone (NASONEX) 50 MCG/ACT nasal spray Place 2 sprays into the nose daily.    . Multiple Vitamins-Minerals (CENTRUM ULTRA WOMENS PO) Take by mouth.  . [DISCONTINUED] azithromycin (ZITHROMAX) 500 MG tablet Take 1 tablet (500 mg total) by mouth daily.  . [DISCONTINUED] doxycycline (VIBRAMYCIN) 100 MG capsule Take 1 capsule (100 mg total) by mouth 2 (two) times daily.    EXAM:  BP 104/70  Temp(Src) 98.9 F (37.2 C) (Oral)  Ht 5' 2.25" (1.581 m)  Wt 108 lb (48.988 kg)  BMI 19.60 kg/m2  LMP 12/13/2013  Body mass  index is 19.6 kg/(m^2).  Physical Exam: Vital signs reviewed [id number] is a well-developed well-nourished alert cooperative    who appearsr stated age in no acute distress.  HEENT: normocephalic atraumatic , Eyes: PERRL EOM's full, conjunctiva clear, Nares: paten,t no deformity discharge or tenderness., Ears: no deformity EAC's clear TMs with normal landmarks. Mouth: clear OP, no lesions, edema.  Moist mucous membranes. Dentition in adequate repair. NECK: supple without masses, thyromegaly or bruits. CHEST/PULM:  Clear to auscultation and percussion breath sounds equal no wheeze , rales or rhonchi. No chest wall deformities or tenderness. CV: PMI is nondisplaced, S1 S2 no gallops, murmurs, rubs. Peripheral pulses are full without delay.No JVD .  Breast: normal by inspection . No dimpling,  discharge, masses, tenderness or discharge . ABDOMEN: Bowel sounds normal nontender  No guard or rebound, no hepato splenomegal no CVA tenderness.  No hernia. Extremtities:  No clubbing cyanosis or edema, no acute joint swelling or redness no focal atrophy NEURO:  Oriented x3, cranial nerves 3-12 appear to be intact, no obvious focal weakness,gait within normal limits no abnormal reflexes or asymmetrical SKIN: No acute rashes normal turgor, color, no bruising or petechiae. PSYCH: Oriented, good eye contact, no obvious depression anxiety, cognition and judgment appear normal. LN: no cervical axillary inguinal adenopathy    ASSESSMENT AND PLAN:  Discussed the following assessment and plan:  Visit for preventive health examination - Plan: Basic metabolic panel, CBC with Differential, Hepatic function panel, Lipid panel, TSH  Need for Tdap vaccination - Plan: Tdap vaccine greater than or equal to 7yo IM  Hx gestational diabetes - Plan: Basic metabolic panel, Hemoglobin A1c immuniz counseling sugar avoidance  Fu depending on labs and how doing .  Counseled regarding healthy nutrition, exercise, sleep, injury prevention, calcium vit d and healthy weight . Pt aware  Patient Care Team: Burnis Medin, MD as PCP - General Patient Instructions  Will notify you  of labs when available.  colonoscopy at 50   Preventive visit at 20 unless indicated before then .  Continue lifestyle intervention healthy eating and exercise . Healthy lifestyle includes : At least 150 minutes of exercise weeks  , weight at healthy levels, which is usually   BMI 19-25. Avoid trans fats and processed foods;  Increase fresh fruits and veges to 5 servings per day. And avoid sweet beverages including tea and juice. Mediterranean diet with olive oil and nuts have been noted to be heart and brain healthy . Avoid tobacco products . Limit  alcohol to  7 per week for women and 14 servings for men.  Get adequate sleep  . Wear seat belts . Don't text and drive .       Standley Brooking. Dorian Renfro M.D.

## 2014-02-04 NOTE — Patient Instructions (Signed)
Will notify you  of labs when available.  colonoscopy at 50   Preventive visit at 57 unless indicated before then .  Continue lifestyle intervention healthy eating and exercise . Healthy lifestyle includes : At least 150 minutes of exercise weeks  , weight at healthy levels, which is usually   BMI 19-25. Avoid trans fats and processed foods;  Increase fresh fruits and veges to 5 servings per day. And avoid sweet beverages including tea and juice. Mediterranean diet with olive oil and nuts have been noted to be heart and brain healthy . Avoid tobacco products . Limit  alcohol to  7 per week for women and 14 servings for men.  Get adequate sleep . Wear seat belts . Don't text and drive .

## 2014-08-05 ENCOUNTER — Other Ambulatory Visit: Payer: Self-pay

## 2014-08-05 DIAGNOSIS — Z1231 Encounter for screening mammogram for malignant neoplasm of breast: Secondary | ICD-10-CM

## 2014-09-02 ENCOUNTER — Ambulatory Visit
Admission: RE | Admit: 2014-09-02 | Discharge: 2014-09-02 | Disposition: A | Discharge status: Home / Self Care | Payer: Commercial Indemnity | Source: Ambulatory Visit

## 2014-09-02 DIAGNOSIS — Z1231 Encounter for screening mammogram for malignant neoplasm of breast: Secondary | ICD-10-CM

## 2014-12-31 ENCOUNTER — Ambulatory Visit (INDEPENDENT_AMBULATORY_CARE_PROVIDER_SITE_OTHER): Payer: Commercial Indemnity | Admitting: Family Medicine

## 2014-12-31 DIAGNOSIS — Z23 Encounter for immunization: Secondary | ICD-10-CM

## 2015-01-08 ENCOUNTER — Ambulatory Visit: Payer: Commercial Indemnity | Admitting: Family Medicine

## 2015-09-20 ENCOUNTER — Other Ambulatory Visit: Payer: Self-pay | Admitting: Obstetrics and Gynecology

## 2015-09-20 DIAGNOSIS — Z1231 Encounter for screening mammogram for malignant neoplasm of breast: Secondary | ICD-10-CM

## 2015-10-29 ENCOUNTER — Ambulatory Visit: Payer: Commercial Indemnity

## 2015-11-11 ENCOUNTER — Ambulatory Visit
Admission: RE | Admit: 2015-11-11 | Discharge: 2015-11-11 | Disposition: A | Discharge status: Home / Self Care | Payer: Managed Care, Other (non HMO) | Source: Ambulatory Visit | Attending: Obstetrics and Gynecology | Admitting: Obstetrics and Gynecology

## 2015-11-11 DIAGNOSIS — Z1231 Encounter for screening mammogram for malignant neoplasm of breast: Secondary | ICD-10-CM

## 2015-12-21 ENCOUNTER — Other Ambulatory Visit (INDEPENDENT_AMBULATORY_CARE_PROVIDER_SITE_OTHER): Payer: Managed Care, Other (non HMO)

## 2015-12-21 ENCOUNTER — Other Ambulatory Visit: Payer: Commercial Indemnity

## 2015-12-21 DIAGNOSIS — Z0000 Encounter for general adult medical examination without abnormal findings: Secondary | ICD-10-CM | POA: Diagnosis not present

## 2015-12-21 LAB — BASIC METABOLIC PANEL
BUN: 12 mg/dL (ref 6–23)
CALCIUM: 8.8 mg/dL (ref 8.4–10.5)
CO2: 27 mEq/L (ref 19–32)
Chloride: 106 mEq/L (ref 96–112)
Creatinine, Ser: 0.77 mg/dL (ref 0.40–1.20)
GFR: 84.29 mL/min (ref >60.00)
GLUCOSE: 82 mg/dL (ref 70–99)
Potassium: 3.9 mEq/L (ref 3.5–5.1)
SODIUM: 137 meq/L (ref 135–145)

## 2015-12-21 LAB — HEPATIC FUNCTION PANEL
ALK PHOS: 37 U/L — AB (ref 39–117)
ALT: 12 U/L (ref 0–35)
AST: 16 U/L (ref 0–37)
Albumin: 4.2 g/dL (ref 3.5–5.2)
BILIRUBIN DIRECT: 0.1 mg/dL (ref 0.0–0.3)
BILIRUBIN TOTAL: 0.8 mg/dL (ref 0.2–1.2)
TOTAL PROTEIN: 7.1 g/dL (ref 6.0–8.3)

## 2015-12-21 LAB — CBC WITH DIFFERENTIAL/PLATELET
BASOS ABS: 0 10*3/uL (ref 0.0–0.1)
Basophils Relative: 0.6 % (ref 0.0–3.0)
EOS ABS: 0 10*3/uL (ref 0.0–0.7)
Eosinophils Relative: 0.6 % (ref 0.0–5.0)
HCT: 37.9 % (ref 36.0–46.0)
Hemoglobin: 12.7 g/dL (ref 12.0–15.0)
LYMPHS ABS: 1.7 10*3/uL (ref 0.7–4.0)
Lymphocytes Relative: 27.9 % (ref 12.0–46.0)
MCHC: 33.5 g/dL (ref 30.0–36.0)
MCV: 85.5 fl (ref 78.0–100.0)
MONOS PCT: 6 % (ref 3.0–12.0)
Monocytes Absolute: 0.4 10*3/uL (ref 0.1–1.0)
NEUTROS PCT: 64.9 % (ref 43.0–77.0)
Neutro Abs: 4 10*3/uL (ref 1.4–7.7)
Platelets: 258 10*3/uL (ref 150.0–400.0)
RBC: 4.43 Mil/uL (ref 3.87–5.11)
RDW: 14.4 % (ref 11.5–15.5)
WBC: 6.2 10*3/uL (ref 4.0–10.5)

## 2015-12-21 LAB — LIPID PANEL
CHOL/HDL RATIO: 4
Cholesterol: 182 mg/dL (ref 0–200)
HDL: 41.2 mg/dL (ref >39.00)
LDL Cholesterol: 127 mg/dL — ABNORMAL HIGH (ref 0–99)
NONHDL: 141.03
Triglycerides: 71 mg/dL (ref 0.0–149.0)
VLDL: 14.2 mg/dL (ref 0.0–40.0)

## 2015-12-21 LAB — TSH: TSH: 1.59 u[IU]/mL (ref 0.35–4.50)

## 2015-12-23 ENCOUNTER — Ambulatory Visit (INDEPENDENT_AMBULATORY_CARE_PROVIDER_SITE_OTHER): Payer: Managed Care, Other (non HMO)

## 2015-12-23 DIAGNOSIS — Z23 Encounter for immunization: Secondary | ICD-10-CM

## 2015-12-27 NOTE — Progress Notes (Signed)
Pre visit review using our clinic review tool, if applicable. No additional management support is needed unless otherwise documented below in the visit note.  Chief Complaint  Patient presents with  . Annual Exam    HPI: Patient  Megan Armstrong  50 y.o. comes in today for Preventive Health Care visit  Had eval gyne dr Ronita Hipps for endometrial thickening neg bx  Provera cycle   Health Maintenance  Topic Date Due  . COLONOSCOPY  10/31/2015  . HIV Screening  12/27/2016 (Originally 10/30/1980)  . PAP SMEAR  03/10/2016  . MAMMOGRAM  11/10/2017  . TETANUS/TDAP  02/05/2024  . 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   ROS: allergies in spring  Refill  [place]  GEN/ HEENT: No fever, significant weight changes sweats headaches vision problems hearing changes, CV/ PULM; No chest pain shortness of breath cough, syncope,edema  change in exercise tolerance. GI /GU: No adominal pain, vomiting, change in bowel habits. No blood in the stool. No significant GU symptoms. SKIN/HEME: ,no acute skin rashes suspicious lesions or bleeding. No lymphadenopathy, nodules, masses.  NEURO/ PSYCH:  No neurologic signs such as weakness numbness. No depression anxiety. IMM/ Allergy: No unusual infections.  Allergy .   REST of 12 system review negative except as per HPI   Past Medical History:  Diagnosis Date  . Allergic rhinitis   . DM mellitus, gestational   . Endometriosis   . Hx of endometriosis    rectal vag with laser surgery   . Seasonal allergies   . TB skin/subcutaneous    postive tb skin test    Past Surgical History:  Procedure Laterality Date  . TONSILLECTOMY      Family History  Problem Relation Age of  Onset  . Migraines Sister   . Asthma Other   . Diabetes Other   . Hypertension Other   . Sudden death Other     Social History   Social History  . Marital status: Married    Spouse name: N/A  . Number of children: N/A  . Years of education: N/A   Social History Main Topics  . Smoking status: Never Smoker  . Smokeless tobacco: None  . Alcohol use No  . Drug use: No  . Sexual activity: Not Asked   Other Topics Concern  . None   Social History Narrative   Borderline spirometry   Married   Regular exercise   decff tea, some coke   2 children at home hh of 4    Pharmacy school internship   Family form [place]    Outpatient Medications Prior to Visit  Medication Sig Dispense Refill  . loratadine (CLARITIN REDITABS) 10 MG dissolvable tablet Take 10 mg by mouth daily.      . Multiple Vitamins-Minerals (CENTRUM ULTRA WOMENS PO) Take by mouth.    . mometasone (NASONEX) 50 MCG/ACT nasal spray Place 2 sprays into the nose daily.       No facility-administered medications prior to visit.      EXAM:  BP 120/80 (BP Location: Right Arm, Patient Position: Sitting, Cuff Size: Normal)   Temp 98.6  F (37 C) (Oral)   Ht 5' 2.75" (1.594 m)   Wt 109 lb 9.6 oz (49.7 kg)   BMI 19.57 kg/m   Body mass index is 19.57 kg/m.  Physical Exam: Vital signs reviewed CHE:NIDP is a well-developed well-nourished alert cooperative    who appearsr stated age in no acute distress.  HEENT: normocephalic atraumatic , Eyes: PERRL EOM's full, conjunctiva clear, Nares: paten,t no deformity discharge or tenderness., Ears: no deformity EAC's clear TMs with normal landmarks. Mouth: clear OP, no lesions, edema.  Moist mucous membranes. Dentition in adequate repair. NECK: supple without masses, thyromegaly or bruits. CHEST/PULM:  Clear to auscultation and percussion breath sounds equal no wheeze , rales or rhonchi. No chest wall deformities or tenderness. CV: PMI is nondisplaced, S1 S2 no gallops,  murmurs, rubs. Peripheral pulses are full without delay.No JVD . Breast: normal by inspection . No dimpling, discharge, masses, tenderness or discharge .= ABDOMEN: Bowel sounds normal nontender  No guard or rebound, no hepato splenomegal no CVA tenderness.  No hernia. Extremtities:  No clubbing cyanosis or edema, no acute joint swelling or redness no focal atrophy NEURO:  Oriented x3, cranial nerves 3-12 appear to be intact, no obvious focal weakness,gait within normal limits no abnormal reflexes or asymmetrical SKIN: No acute rashes normal turgor, color, no bruising or petechiae. PSYCH: Oriented, good eye contact, no obvious depression anxiety, cognition and judgment appear normal. LN: no cervical axillary inguinal adenopathy  Lab Results  Component Value Date   WBC 6.2 12/21/2015   HGB 12.7 12/21/2015   HCT 37.9 12/21/2015   PLT 258.0 12/21/2015   GLUCOSE 82 12/21/2015   CHOL 182 12/21/2015   TRIG 71.0 12/21/2015   HDL 41.20 12/21/2015   LDLCALC 127 (H) 12/21/2015   ALT 12 12/21/2015   AST 16 12/21/2015   NA 137 12/21/2015   K 3.9 12/21/2015   CL 106 12/21/2015   CREATININE 0.77 12/21/2015   BUN 12 12/21/2015   CO2 27 12/21/2015   TSH 1.59 12/21/2015   HGBA1C 5.3 02/04/2014   Reviewed with patient ASSESSMENT AND PLAN:  Discussed the following assessment and plan:  Visit for preventive health examination  Seasonal allergic rhinitis  Hx gestational diabetes fbs ok work attention to lipids etc .  She is healthy  Screening reviewed . Refill nasonex prn one year   Patient Care Team: Burnis Medin, MD as PCP - General Brien Few, MD as Consulting Physician (Obstetrics and Gynecology) Patient Instructions  Continue lifestyle intervention healthy eating and exercise . Get enough sleep.  Contact Dr.  Collene Mares office about getting screening colonoscopy.  Let us know if you need a referral .    Health Maintenance, Female Adopting a healthy lifestyle and getting preventive  care can go a long way to promote health and wellness. Talk with your health care provider about what schedule of regular examinations is right for you. This is a good chance for you to check in with your provider about disease prevention and staying healthy. In between checkups, there are plenty of things you can do on your own. Experts have done a lot of research about which lifestyle changes and preventive measures are most likely to keep you healthy. Ask your health care provider for more information. WEIGHT AND DIET  Eat a healthy diet  Be sure to include plenty of vegetables, fruits, low-fat dairy products, and lean protein.  Do not eat a lot of foods high in solid fats, added sugars, or salt.  Get regular exercise. This is one of the most important things you can do for your health.  Most adults should exercise for at least 150 minutes each week. The exercise should increase your heart rate and make you sweat (moderate-intensity exercise).  Most adults should also do strengthening exercises at least twice a week. This is in addition to the moderate-intensity exercise.  Maintain a healthy weight  Body mass index (BMI) is a measurement that can be used to identify possible weight problems. It estimates body fat based on height and weight. Your health care provider can help determine your BMI and help you achieve or maintain a healthy weight.  For females 25 years of age and older:   A BMI below 18.5 is considered underweight.  A BMI of 18.5 to 24.9 is normal.  A BMI of 25 to 29.9 is considered overweight.  A BMI of 30 and above is considered obese.  Watch levels of cholesterol and blood lipids  You should start having your blood tested for lipids and cholesterol at 50 years of age, then have this test every 5 years.  You may need to have your cholesterol levels checked more often if:  Your lipid or cholesterol levels are high.  You are older than 50 years of age.  You are  at high risk for heart disease.  CANCER SCREENING   Lung Cancer  Lung cancer screening is recommended for adults 13-21 years old who are at high risk for lung cancer because of a history of smoking.  A yearly low-dose CT scan of the lungs is recommended for people who:  Currently smoke.  Have quit within the past 15 years.  Have at least a 30-pack-year history of smoking. A pack year is smoking an average of one pack of cigarettes a day for 1 year.  Yearly screening should continue until it has been 15 years since you quit.  Yearly screening should stop if you develop a health problem that would prevent you from having lung cancer treatment.  Breast Cancer  Practice breast self-awareness. This means understanding how your breasts normally appear and feel.  It also means doing regular breast self-exams. Let your health care provider know about any changes, no matter how small.  If you are in your 20s or 30s, you should have a clinical breast exam (CBE) by a health care provider every 1-3 years as part of a regular health exam.  If you are 46 or older, have a CBE every year. Also consider having a breast X-ray (mammogram) every year.  If you have a family history of breast cancer, talk to your health care provider about genetic screening.  If you are at high risk for breast cancer, talk to your health care provider about having an MRI and a mammogram every year.  Breast cancer gene (BRCA) assessment is recommended for women who have family members with BRCA-related cancers. BRCA-related cancers include:  Breast.  Ovarian.  Tubal.  Peritoneal cancers.  Results of the assessment will determine the need for genetic counseling and BRCA1 and BRCA2 testing. Cervical Cancer Your health care provider may recommend that you be screened regularly for cancer of the pelvic organs (ovaries, uterus, and vagina). This screening involves a pelvic examination, including checking for  microscopic changes to the surface of your cervix (Pap test). You may be encouraged to have this screening done every 3 years, beginning at age 13.  For women ages 41-65, health care providers may recommend pelvic exams  and Pap testing every 3 years, or they may recommend the Pap and pelvic exam, combined with testing for human papilloma virus (HPV), every 5 years. Some types of HPV increase your risk of cervical cancer. Testing for HPV may also be done on women of any age with unclear Pap test results.  Other health care providers may not recommend any screening for nonpregnant women who are considered low risk for pelvic cancer and who do not have symptoms. Ask your health care provider if a screening pelvic exam is right for you.  If you have had past treatment for cervical cancer or a condition that could lead to cancer, you need Pap tests and screening for cancer for at least 20 years after your treatment. If Pap tests have been discontinued, your risk factors (such as having a new sexual partner) need to be reassessed to determine if screening should resume. Some women have medical problems that increase the chance of getting cervical cancer. In these cases, your health care provider may recommend more frequent screening and Pap tests. Colorectal Cancer  This type of cancer can be detected and often prevented.  Routine colorectal cancer screening usually begins at 50 years of age and continues through 49 years of age.  Your health care provider may recommend screening at an earlier age if you have risk factors for colon cancer.  Your health care provider may also recommend using home test kits to check for hidden blood in the stool.  A small camera at the end of a tube can be used to examine your colon directly (sigmoidoscopy or colonoscopy). This is done to check for the earliest forms of colorectal cancer.  Routine screening usually begins at age 48.  Direct examination of the colon  should be repeated every 5-10 years through 50 years of age. However, you may need to be screened more often if early forms of precancerous polyps or small growths are found. Skin Cancer  Check your skin from head to toe regularly.  Tell your health care provider about any new moles or changes in moles, especially if there is a change in a mole's shape or color.  Also tell your health care provider if you have a mole that is larger than the size of a pencil eraser.  Always use sunscreen. Apply sunscreen liberally and repeatedly throughout the day.  Protect yourself by wearing long sleeves, pants, a wide-brimmed hat, and sunglasses whenever you are outside. HEART DISEASE, DIABETES, AND HIGH BLOOD PRESSURE   High blood pressure causes heart disease and increases the risk of stroke. High blood pressure is more likely to develop in:  People who have blood pressure in the high end of the normal range (130-139/85-89 mm Hg).  People who are overweight or obese.  People who are African American.  If you are 84-51 years of age, have your blood pressure checked every 3-5 years. If you are 31 years of age or older, have your blood pressure checked every year. You should have your blood pressure measured twice--once when you are at a hospital or clinic, and once when you are not at a hospital or clinic. Record the average of the two measurements. To check your blood pressure when you are not at a hospital or clinic, you can use:  An automated blood pressure machine at a pharmacy.  A home blood pressure monitor.  If you are between 7 years and 6 years old, ask your health care provider if you should take aspirin  to prevent strokes.  Have regular diabetes screenings. This involves taking a blood sample to check your fasting blood sugar level.  If you are at a normal weight and have a low risk for diabetes, have this test once every three years after 50 years of age.  If you are overweight and  have a high risk for diabetes, consider being tested at a younger age or more often. PREVENTING INFECTION  Hepatitis B  If you have a higher risk for hepatitis B, you should be screened for this virus. You are considered at high risk for hepatitis B if:  You were born in a country where hepatitis B is common. Ask your health care provider which countries are considered high risk.  Your parents were born in a high-risk country, and you have not been immunized against hepatitis B (hepatitis B vaccine).  You have HIV or AIDS.  You use needles to inject street drugs.  You live with someone who has hepatitis B.  You have had sex with someone who has hepatitis B.  You get hemodialysis treatment.  You take certain medicines for conditions, including cancer, organ transplantation, and autoimmune conditions. Hepatitis C  Blood testing is recommended for:  Everyone born from [birth year] through 1965.  Anyone with known risk factors for hepatitis C. Sexually transmitted infections (STIs)  You should be screened for sexually transmitted infections (STIs) including gonorrhea and chlamydia if:  You are sexually active and are younger than 50 years of age.  You are older than 50 years of age and your health care provider tells you that you are at risk for this type of infection.  Your sexual activity has changed since you were last screened and you are at an increased risk for chlamydia or gonorrhea. Ask your health care provider if you are at risk.  If you do not have HIV, but are at risk, it may be recommended that you take a prescription medicine daily to prevent HIV infection. This is called pre-exposure prophylaxis (PrEP). You are considered at risk if:  You are sexually active and do not regularly use condoms or know the HIV status of your partner(s).  You take drugs by injection.  You are sexually active with a partner who has HIV. Talk with your health care provider about whether you  are at high risk of being infected with HIV. If you choose to begin PrEP, you should first be tested for HIV. You should then be tested every 3 months for as long as you are taking PrEP.  PREGNANCY   If you are premenopausal and you may become pregnant, ask your health care provider about preconception counseling.  If you may become pregnant, take 400 to 800 micrograms (mcg) of folic acid every day.  If you want to prevent pregnancy, talk to your health care provider about birth control (contraception). OSTEOPOROSIS AND MENOPAUSE   Osteoporosis is a disease in which the bones lose minerals and strength with aging. This can result in serious bone fractures. Your risk for osteoporosis can be identified using a bone density scan.  If you are 23 years of age or older, or if you are at risk for osteoporosis and fractures, ask your health care provider if you should be screened.  Ask your health care provider whether you should take a calcium or vitamin D supplement to lower your risk for osteoporosis.  Menopause may have certain physical symptoms and risks.  Hormone replacement therapy may reduce some of these symptoms  and risks. Talk to your health care provider about whether hormone replacement therapy is right for you.  HOME CARE INSTRUCTIONS   Schedule regular health, dental, and eye exams.  Stay current with your immunizations.   Do not use any tobacco products including cigarettes, chewing tobacco, or electronic cigarettes.  If you are pregnant, do not drink alcohol.  If you are breastfeeding, limit how much and how often you drink alcohol.  Limit alcohol intake to no more than 1 drink per day for nonpregnant women. One drink equals 12 ounces of beer, 5 ounces of wine, or 1 ounces of hard liquor.  Do not use street drugs.  Do not share needles.  Ask your health care provider for help if you need support or information about quitting drugs.  Tell your health care provider if  you often feel depressed.  Tell your health care provider if you have ever been abused or do not feel safe at home.   This information is not intended to replace advice given to you by your health care provider. Make sure you discuss any questions you have with your health care provider.   Document Released: 10/10/2010 Document Revised: 04/17/2014 Document Reviewed: 02/26/2013 Elsevier Interactive Patient Education 2016 [employer]. Johnell Landowski M.D.

## 2015-12-28 ENCOUNTER — Ambulatory Visit (INDEPENDENT_AMBULATORY_CARE_PROVIDER_SITE_OTHER): Payer: Managed Care, Other (non HMO) | Admitting: Internal Medicine

## 2015-12-28 ENCOUNTER — Encounter: Payer: Self-pay | Admitting: Internal Medicine

## 2015-12-28 VITALS — BP 120/80 | Temp 98.6°F | Ht 62.75 in | Wt 109.6 lb

## 2015-12-28 DIAGNOSIS — Z8632 Personal history of gestational diabetes: Secondary | ICD-10-CM

## 2015-12-28 DIAGNOSIS — Z0000 Encounter for general adult medical examination without abnormal findings: Secondary | ICD-10-CM

## 2015-12-28 DIAGNOSIS — J302 Other seasonal allergic rhinitis: Secondary | ICD-10-CM

## 2015-12-28 MED ORDER — MOMETASONE FUROATE 50 MCG/ACT NA SUSP: 2.0000 | Freq: Every day | NASAL | 11 refills | Status: DC

## 2015-12-28 NOTE — Patient Instructions (Signed)
Continue lifestyle intervention healthy eating and exercise . Get enough sleep.  Contact Dr.  Collene Mares office about getting screening colonoscopy.  Let us know if you need a referral .    Health Maintenance, Female Adopting a healthy lifestyle and getting preventive care can go a long way to promote health and wellness. Talk with your health care provider about what schedule of regular examinations is right for you. This is a good chance for you to check in with your provider about disease prevention and staying healthy. In between checkups, there are plenty of things you can do on your own. Experts have done a lot of research about which lifestyle changes and preventive measures are most likely to keep you healthy. Ask your health care provider for more information. WEIGHT AND DIET  Eat a healthy diet  Be sure to include plenty of vegetables, fruits, low-fat dairy products, and lean protein.  Do not eat a lot of foods high in solid fats, added sugars, or salt.  Get regular exercise. This is one of the most important things you can do for your health.  Most adults should exercise for at least 150 minutes each week. The exercise should increase your heart rate and make you sweat (moderate-intensity exercise).  Most adults should also do strengthening exercises at least twice a week. This is in addition to the moderate-intensity exercise.  Maintain a healthy weight  Body mass index (BMI) is a measurement that can be used to identify possible weight problems. It estimates body fat based on height and weight. Your health care provider can help determine your BMI and help you achieve or maintain a healthy weight.  For females 2 years of age and older:   A BMI below 18.5 is considered underweight.  A BMI of 18.5 to 24.9 is normal.  A BMI of 25 to 29.9 is considered overweight.  A BMI of 30 and above is considered obese.  Watch levels of cholesterol and blood lipids  You should start  having your blood tested for lipids and cholesterol at 50 years of age, then have this test every 5 years.  You may need to have your cholesterol levels checked more often if:  Your lipid or cholesterol levels are high.  You are older than 50 years of age.  You are at high risk for heart disease.  CANCER SCREENING   Lung Cancer  Lung cancer screening is recommended for adults 16-79 years old who are at high risk for lung cancer because of a history of smoking.  A yearly low-dose CT scan of the lungs is recommended for people who:  Currently smoke.  Have quit within the past 15 years.  Have at least a 30-pack-year history of smoking. A pack year is smoking an average of one pack of cigarettes a day for 1 year.  Yearly screening should continue until it has been 15 years since you quit.  Yearly screening should stop if you develop a health problem that would prevent you from having lung cancer treatment.  Breast Cancer  Practice breast self-awareness. This means understanding how your breasts normally appear and feel.  It also means doing regular breast self-exams. Let your health care provider know about any changes, no matter how small.  If you are in your 20s or 30s, you should have a clinical breast exam (CBE) by a health care provider every 1-3 years as part of a regular health exam.  If you are 75 or older, have a  CBE every year. Also consider having a breast X-ray (mammogram) every year.  If you have a family history of breast cancer, talk to your health care provider about genetic screening.  If you are at high risk for breast cancer, talk to your health care provider about having an MRI and a mammogram every year.  Breast cancer gene (BRCA) assessment is recommended for women who have family members with BRCA-related cancers. BRCA-related cancers include:  Breast.  Ovarian.  Tubal.  Peritoneal cancers.  Results of the assessment will determine the need for  genetic counseling and BRCA1 and BRCA2 testing. Cervical Cancer Your health care provider may recommend that you be screened regularly for cancer of the pelvic organs (ovaries, uterus, and vagina). This screening involves a pelvic examination, including checking for microscopic changes to the surface of your cervix (Pap test). You may be encouraged to have this screening done every 3 years, beginning at age 18.  For women ages 32-65, health care providers may recommend pelvic exams and Pap testing every 3 years, or they may recommend the Pap and pelvic exam, combined with testing for human papilloma virus (HPV), every 5 years. Some types of HPV increase your risk of cervical cancer. Testing for HPV may also be done on women of any age with unclear Pap test results.  Other health care providers may not recommend any screening for nonpregnant women who are considered low risk for pelvic cancer and who do not have symptoms. Ask your health care provider if a screening pelvic exam is right for you.  If you have had past treatment for cervical cancer or a condition that could lead to cancer, you need Pap tests and screening for cancer for at least 20 years after your treatment. If Pap tests have been discontinued, your risk factors (such as having a new sexual partner) need to be reassessed to determine if screening should resume. Some women have medical problems that increase the chance of getting cervical cancer. In these cases, your health care provider may recommend more frequent screening and Pap tests. Colorectal Cancer  This type of cancer can be detected and often prevented.  Routine colorectal cancer screening usually begins at 50 years of age and continues through 50 years of age.  Your health care provider may recommend screening at an earlier age if you have risk factors for colon cancer.  Your health care provider may also recommend using home test kits to check for hidden blood in the  stool.  A small camera at the end of a tube can be used to examine your colon directly (sigmoidoscopy or colonoscopy). This is done to check for the earliest forms of colorectal cancer.  Routine screening usually begins at age 63.  Direct examination of the colon should be repeated every 5-10 years through 51 years of age. However, you may need to be screened more often if early forms of precancerous polyps or small growths are found. Skin Cancer  Check your skin from head to toe regularly.  Tell your health care provider about any new moles or changes in moles, especially if there is a change in a mole's shape or color.  Also tell your health care provider if you have a mole that is larger than the size of a pencil eraser.  Always use sunscreen. Apply sunscreen liberally and repeatedly throughout the day.  Protect yourself by wearing long sleeves, pants, a wide-brimmed hat, and sunglasses whenever you are outside. HEART DISEASE, DIABETES, AND HIGH BLOOD  PRESSURE   High blood pressure causes heart disease and increases the risk of stroke. High blood pressure is more likely to develop in:  People who have blood pressure in the high end of the normal range (130-139/85-89 mm Hg).  People who are overweight or obese.  People who are African American.  If you are 71-47 years of age, have your blood pressure checked every 3-5 years. If you are 16 years of age or older, have your blood pressure checked every year. You should have your blood pressure measured twice--once when you are at a hospital or clinic, and once when you are not at a hospital or clinic. Record the average of the two measurements. To check your blood pressure when you are not at a hospital or clinic, you can use:  An automated blood pressure machine at a pharmacy.  A home blood pressure monitor.  If you are between 56 years and 71 years old, ask your health care provider if you should take aspirin to prevent  strokes.  Have regular diabetes screenings. This involves taking a blood sample to check your fasting blood sugar level.  If you are at a normal weight and have a low risk for diabetes, have this test once every three years after 50 years of age.  If you are overweight and have a high risk for diabetes, consider being tested at a younger age or more often. PREVENTING INFECTION  Hepatitis B  If you have a higher risk for hepatitis B, you should be screened for this virus. You are considered at high risk for hepatitis B if:  You were born in a country where hepatitis B is common. Ask your health care provider which countries are considered high risk.  Your parents were born in a high-risk country, and you have not been immunized against hepatitis B (hepatitis B vaccine).  You have HIV or AIDS.  You use needles to inject street drugs.  You live with someone who has hepatitis B.  You have had sex with someone who has hepatitis B.  You get hemodialysis treatment.  You take certain medicines for conditions, including cancer, organ transplantation, and autoimmune conditions. Hepatitis C  Blood testing is recommended for:  Everyone born from [birth year] through 1965.  Anyone with known risk factors for hepatitis C. Sexually transmitted infections (STIs)  You should be screened for sexually transmitted infections (STIs) including gonorrhea and chlamydia if:  You are sexually active and are younger than 50 years of age.  You are older than 50 years of age and your health care provider tells you that you are at risk for this type of infection.  Your sexual activity has changed since you were last screened and you are at an increased risk for chlamydia or gonorrhea. Ask your health care provider if you are at risk.  If you do not have HIV, but are at risk, it may be recommended that you take a prescription medicine daily to prevent HIV infection. This is called pre-exposure prophylaxis  (PrEP). You are considered at risk if:  You are sexually active and do not regularly use condoms or know the HIV status of your partner(s).  You take drugs by injection.  You are sexually active with a partner who has HIV. Talk with your health care provider about whether you are at high risk of being infected with HIV. If you choose to begin PrEP, you should first be tested for HIV. You should then be tested every 3  months for as long as you are taking PrEP.  PREGNANCY   If you are premenopausal and you may become pregnant, ask your health care provider about preconception counseling.  If you may become pregnant, take 400 to 800 micrograms (mcg) of folic acid every day.  If you want to prevent pregnancy, talk to your health care provider about birth control (contraception). OSTEOPOROSIS AND MENOPAUSE   Osteoporosis is a disease in which the bones lose minerals and strength with aging. This can result in serious bone fractures. Your risk for osteoporosis can be identified using a bone density scan.  If you are 19 years of age or older, or if you are at risk for osteoporosis and fractures, ask your health care provider if you should be screened.  Ask your health care provider whether you should take a calcium or vitamin D supplement to lower your risk for osteoporosis.  Menopause may have certain physical symptoms and risks.  Hormone replacement therapy may reduce some of these symptoms and risks. Talk to your health care provider about whether hormone replacement therapy is right for you.  HOME CARE INSTRUCTIONS   Schedule regular health, dental, and eye exams.  Stay current with your immunizations.   Do not use any tobacco products including cigarettes, chewing tobacco, or electronic cigarettes.  If you are pregnant, do not drink alcohol.  If you are breastfeeding, limit how much and how often you drink alcohol.  Limit alcohol intake to no more than 1 drink per day for  nonpregnant women. One drink equals 12 ounces of beer, 5 ounces of wine, or 1 ounces of hard liquor.  Do not use street drugs.  Do not share needles.  Ask your health care provider for help if you need support or information about quitting drugs.  Tell your health care provider if you often feel depressed.  Tell your health care provider if you have ever been abused or do not feel safe at home.   This information is not intended to replace advice given to you by your health care provider. Make sure you discuss any questions you have with your health care provider.   Document Released: 10/10/2010 Document Revised: 04/17/2014 Document Reviewed: 02/26/2013 Elsevier Interactive Patient Education [employer].

## 2016-03-08 ENCOUNTER — Telehealth: Payer: Self-pay | Admitting: Internal Medicine

## 2016-03-08 DIAGNOSIS — Z1211 Encounter for screening for malignant neoplasm of colon: Secondary | ICD-10-CM

## 2016-03-08 NOTE — Telephone Encounter (Signed)
° ° °  Pt said she called to make an appt with a GI doctor for her colonscopy and they told her they need a referral and her recent labs.   Dr Juanita Craver      [phone]

## 2016-03-09 NOTE — Telephone Encounter (Signed)
Referral placed in the sytem. 

## 2016-09-20 ENCOUNTER — Telehealth: Payer: Self-pay | Admitting: Internal Medicine

## 2016-09-20 MED ORDER — DOXYCYCLINE HYCLATE 100 MG PO CAPS
100.0000 mg | ORAL_CAPSULE | Freq: Two times a day (BID) | ORAL | 0 refills | Status: DC
Start: 2016-09-20 — End: 2018-04-08

## 2016-09-20 NOTE — Telephone Encounter (Signed)
Patient here with daughter today for wcc  Has had a flar of the bumps around her nsoe anc mouth   recnetly   In past  meds really helped   adoxy  Request  Poss of refill vs OVC.    Face  Fine papules nl areas  Mild erythema  Will send in 10 dcourse of doxy fu  If  persistent or progressive n

## 2016-10-09 ENCOUNTER — Other Ambulatory Visit: Payer: Self-pay | Admitting: Obstetrics and Gynecology

## 2016-10-09 DIAGNOSIS — Z1231 Encounter for screening mammogram for malignant neoplasm of breast: Secondary | ICD-10-CM

## 2016-11-13 ENCOUNTER — Ambulatory Visit
Admission: RE | Admit: 2016-11-13 | Discharge: 2016-11-13 | Disposition: A | Discharge status: Home / Self Care | Payer: Managed Care, Other (non HMO) | Source: Ambulatory Visit | Attending: Obstetrics and Gynecology | Admitting: Obstetrics and Gynecology

## 2016-11-13 DIAGNOSIS — Z1231 Encounter for screening mammogram for malignant neoplasm of breast: Secondary | ICD-10-CM

## 2016-12-22 ENCOUNTER — Ambulatory Visit (INDEPENDENT_AMBULATORY_CARE_PROVIDER_SITE_OTHER): Payer: Managed Care, Other (non HMO)

## 2016-12-22 DIAGNOSIS — Z23 Encounter for immunization: Secondary | ICD-10-CM | POA: Diagnosis not present

## 2017-01-19 ENCOUNTER — Ambulatory Visit (INDEPENDENT_AMBULATORY_CARE_PROVIDER_SITE_OTHER): Payer: Managed Care, Other (non HMO) | Admitting: Internal Medicine

## 2017-01-19 ENCOUNTER — Encounter: Payer: Self-pay | Admitting: Internal Medicine

## 2017-01-19 VITALS — BP 92/70 | HR 72 | Temp 98.0°F | Ht 63.0 in | Wt 107.0 lb

## 2017-01-19 DIAGNOSIS — Z13220 Encounter for screening for lipoid disorders: Secondary | ICD-10-CM

## 2017-01-19 DIAGNOSIS — Z8632 Personal history of gestational diabetes: Secondary | ICD-10-CM

## 2017-01-19 DIAGNOSIS — Z0000 Encounter for general adult medical examination without abnormal findings: Secondary | ICD-10-CM

## 2017-01-19 NOTE — Patient Instructions (Addendum)
Fasting labs.  appt  Plan for  shingrix     In future  .   Try core  And  Resistance exercises    Walking to help back and fu    persistent or progressive    Preventive Care 40-64 Years, Female Preventive care refers to lifestyle choices and visits with your health care provider that can promote health and wellness. What does preventive care include?  A yearly physical exam. This is also called an annual well check.  Dental exams once or twice a year.  Routine eye exams. Ask your health care provider how often you should have your eyes checked.  Personal lifestyle choices, including: ? Daily care of your teeth and gums. ? Regular physical activity. ? Eating a healthy diet. ? Avoiding tobacco and drug use. ? Limiting alcohol use. ? Practicing safe sex. ? Taking low-dose aspirin daily starting at age 30. ? Taking vitamin and mineral supplements as recommended by your health care provider. What happens during an annual well check? The services and screenings done by your health care provider during your annual well check will depend on your age, overall health, lifestyle risk factors, and family history of disease. Counseling Your health care provider may ask you questions about your:  Alcohol use.  Tobacco use.  Drug use.  Emotional well-being.  Home and relationship well-being.  Sexual activity.  Eating habits.  Work and work Statistician.  Method of birth control.  Menstrual cycle.  Pregnancy history.  Screening You may have the following tests or measurements:  Height, weight, and BMI.  Blood pressure.  Lipid and cholesterol levels. These may be checked every 5 years, or more frequently if you are over 25 years old.  Skin check.  Lung cancer screening. You may have this screening every year starting at age 14 if you have a 30-pack-year history of smoking and currently smoke or have quit within the past 15 years.  Fecal occult blood test (FOBT) of the  stool. You may have this test every year starting at age 46.  Flexible sigmoidoscopy or colonoscopy. You may have a sigmoidoscopy every 5 years or a colonoscopy every 10 years starting at age 24.  Hepatitis C blood test.  Hepatitis B blood test.  Sexually transmitted disease (STD) testing.  Diabetes screening. This is done by checking your blood sugar (glucose) after you have not eaten for a while (fasting). You may have this done every 1-3 years.  Mammogram. This may be done every 1-2 years. Talk to your health care provider about when you should start having regular mammograms. This may depend on whether you have a family history of breast cancer.  BRCA-related cancer screening. This may be done if you have a family history of breast, ovarian, tubal, or peritoneal cancers.  Pelvic exam and Pap test. This may be done every 3 years starting at age 48. Starting at age 78, this may be done every 5 years if you have a Pap test in combination with an HPV test.  Bone density scan. This is done to screen for osteoporosis. You may have this scan if you are at high risk for osteoporosis.  Discuss your test results, treatment options, and if necessary, the need for more tests with your health care provider. Vaccines Your health care provider may recommend certain vaccines, such as:  Influenza vaccine. This is recommended every year.  Tetanus, diphtheria, and acellular pertussis (Tdap, Td) vaccine. You may need a Td booster every 10 years.  Varicella vaccine. You may need this if you have not been vaccinated.  Zoster vaccine. You may need this after age 40.  Measles, mumps, and rubella (MMR) vaccine. You may need at least one dose of MMR if you were born in 1957 or later. You may also need a second dose.  Pneumococcal 13-valent conjugate (PCV13) vaccine. You may need this if you have certain conditions and were not previously vaccinated.  Pneumococcal polysaccharide (PPSV23) vaccine. You  may need one or two doses if you smoke cigarettes or if you have certain conditions.  Meningococcal vaccine. You may need this if you have certain conditions.  Hepatitis A vaccine. You may need this if you have certain conditions or if you travel or work in places where you may be exposed to hepatitis A.  Hepatitis B vaccine. You may need this if you have certain conditions or if you travel or work in places where you may be exposed to hepatitis B.  Haemophilus influenzae type b (Hib) vaccine. You may need this if you have certain conditions.  Talk to your health care provider about which screenings and vaccines you need and how often you need them. This information is not intended to replace advice given to you by your health care provider. Make sure you discuss any questions you have with your health care provider. Document Released: 04/23/2015 Document Revised: 12/15/2015 Document Reviewed: 01/26/2015 Elsevier Interactive Patient Education  2017 [employer].

## 2017-01-19 NOTE — Progress Notes (Signed)
Chief Complaint  Patient presents with  . Annual Exam    HPI: Patient  Megan Armstrong  51 y.o. comes in today for Preventive Health Care visit  Here with her child has wcc.   Dr Collene Mares did colon  polyps.  10 years   Recall.   Now back pain with  Menstrual cycle   And  Then ok .    Marland Kitchen  Back brace .   No prev injury .  No assoc sx .     Dx  of endometriosis      tavoon saw her  47   Endometrial hyperplaisia    rx with provera cyclione   Has  Uses otc    advil migraine       Working now managing husbands OP practice .    Health Maintenance  Topic Date Due  . HIV Screening  10/30/1980  . COLONOSCOPY  10/31/2015  . PAP SMEAR  03/10/2016  . MAMMOGRAM  11/14/2018  . TETANUS/TDAP  02/05/2024  . INFLUENZA VACCINE  Completed   Health Maintenance Review LIFESTYLE:  Exercise:     Work and walking .  Tobacco/ETS:no Alcohol:   n Sugar beverages: tea  Sleep: 6  Drug use: no HH of  4  Work:  About 30 at [employer] and [employer] .  ROS:  See above GEN/ HEENT: No fever, significant weight changes sweats headaches vision problems hearing changes, CV/ PULM; No chest pain shortness of breath cough, syncope,edema  change in exercise tolerance. GI /GU: No adominal pain, vomiting, change in bowel habits. No blood in the stool. No significant GU symptoms. SKIN/HEME: ,no acute skin rashes suspicious lesions or bleeding. No lymphadenopathy, nodules, masses.  NEURO/ PSYCH:  No neurologic signs such as weakness numbness. No depression anxiety. IMM/ Allergy: No unusual infections.  Allergy .   REST of 12 system review negative except as per HPI   Past Medical History:  Diagnosis Date  . Allergic rhinitis   . DM mellitus, gestational   . Endometriosis   . Hx of endometriosis    rectal vag with laser surgery   . Seasonal allergies   . TB skin/subcutaneous    postive tb skin test    Past Surgical History:  Procedure Laterality Date  . TONSILLECTOMY      Family History  Problem Relation Age  of Onset  . Migraines Sister   . Asthma Other   . Diabetes Other   . Hypertension Other   . Sudden death Other     Social History   Social History  . Marital status: Married    Spouse name: N/A  . Number of children: N/A  . Years of education: N/A   Social History Main Topics  . Smoking status: Never Smoker  . Smokeless tobacco: Never Used  . Alcohol use No  . Drug use: No  . Sexual activity: Not Asked   Other Topics Concern  . None   Social History Narrative   Borderline spirometry   Married   Regular exercise   decff tea, some coke   2 children at home hh of 4    Pharmacy school internship   Family form [place]    Outpatient Medications Prior to Visit  Medication Sig Dispense Refill  . doxycycline (VIBRAMYCIN) 100 MG capsule Take 1 capsule (100 mg total) by mouth 2 (two) times daily. 20 capsule 0  . loratadine (CLARITIN REDITABS) 10 MG dissolvable tablet Take 10 mg by mouth daily.      Marland Kitchen  mometasone (NASONEX) 50 MCG/ACT nasal spray Place 2 sprays into the nose daily. Each nostril 17 g 11  . Multiple Vitamins-Minerals (CENTRUM ULTRA WOMENS PO) Take by mouth.     No facility-administered medications prior to visit.      EXAM:  BP 92/70 (BP Location: Left Arm, Patient Position: Sitting, Cuff Size: Normal)   Pulse 72   Temp 98 F (36.7 C) (Oral)   Ht 5' 3"  (1.6 m)   Wt 107 lb (48.5 kg)   LMP 01/16/2017 (Exact Date)   SpO2 98%   BMI 18.95 kg/m   Body mass index is 18.95 kg/m. Wt Readings from Last 3 Encounters:  01/19/17 107 lb (48.5 kg)  12/28/15 109 lb 9.6 oz (49.7 kg)  02/04/14 108 lb (49 kg)    Physical Exam: Vital signs reviewed ELF:YBOF is a well-developed well-nourished alert cooperative    who appearsr stated age in no acute distress.  HEENT: normocephalic atraumatic , Eyes: PERRL EOM's full, conjunctiva clear, Nares: paten,t no deformity discharge or tenderness., Ears: no deformity EAC's clear TMs with normal landmarks. Mouth: clear OP,  no lesions, edema.  Moist mucous membranes. Dentition in adequate repair. NECK: supple without masses, thyromegaly or bruits. CHEST/PULM:  Clear to auscultation and percussion breath sounds equal no wheeze , rales or rhonchi. No chest wall deformities or tenderness. Breast: normal by inspection . No dimpling, discharge, masses, tenderness or discharge . CV: PMI is nondisplaced, S1 S2 no gallops, murmurs, rubs. Peripheral pulses are full without delay.No JVD .  ABDOMEN: Bowel sounds normal nontender  No guard or rebound, no hepato splenomegal no CVA tenderness.  No hernia. Extremtities:  No clubbing cyanosis or edema, no acute joint swelling or redness no focal atrophy NEURO:  Oriented x3, cranial nerves 3-12 appear to be intact, no obvious focal weakness,gait within normal limits no abnormal reflexes or asymmetrical SKIN: No acute rashes normal turgor, color, no bruising or petechiae. PSYCH: Oriented, good eye contact, no obvious depression anxiety, cognition and judgment appear normal. LN: no cervical axillary inguinal adenopathy  Lab Results  Component Value Date   WBC 6.2 12/21/2015   HGB 12.7 12/21/2015   HCT 37.9 12/21/2015   PLT 258.0 12/21/2015   GLUCOSE 82 12/21/2015   CHOL 182 12/21/2015   TRIG 71.0 12/21/2015   HDL 41.20 12/21/2015   LDLCALC 127 (H) 12/21/2015   ALT 12 12/21/2015   AST 16 12/21/2015   NA 137 12/21/2015   K 3.9 12/21/2015   CL 106 12/21/2015   CREATININE 0.77 12/21/2015   BUN 12 12/21/2015   CO2 27 12/21/2015   TSH 1.59 12/21/2015   HGBA1C 5.3 02/04/2014    BP Readings from Last 3 Encounters:  01/19/17 92/70  12/28/15 120/80  02/04/14 104/70    Lab results reviewed with patient   ASSESSMENT AND PLAN:  Discussed the following assessment and plan:  Visit for preventive health examination - Plan: CBC with Differential/Platelet, Hemoglobin A1c, TSH, Lipid panel, Basic metabolic panel, Hepatic function panel  Hx gestational diabetes - Plan: CBC  with Differential/Platelet, Hemoglobin A1c, TSH, Lipid panel, Basic metabolic panel, Hepatic function panel  Screening, lipid - Plan: Lipid panel Hx endometriosis  lbp cyclic and postural    Expectant management.  Patient Care Team: Tahtiana Rozier, Standley Brooking, MD as PCP - Sondra Barges, MD as Consulting Physician (Obstetrics and Gynecology) Patient Instructions   Fasting labs.  appt  Plan for  shingrix     In future  .   Try core  And  Resistance exercises    Walking to help back and fu    persistent or progressive    Preventive Care 40-64 Years, Female Preventive care refers to lifestyle choices and visits with your health care provider that can promote health and wellness. What does preventive care include?  A yearly physical exam. This is also called an annual well check.  Dental exams once or twice a year.  Routine eye exams. Ask your health care provider how often you should have your eyes checked.  Personal lifestyle choices, including: ? Daily care of your teeth and gums. ? Regular physical activity. ? Eating a healthy diet. ? Avoiding tobacco and drug use. ? Limiting alcohol use. ? Practicing safe sex. ? Taking low-dose aspirin daily starting at age 2. ? Taking vitamin and mineral supplements as recommended by your health care provider. What happens during an annual well check? The services and screenings done by your health care provider during your annual well check will depend on your age, overall health, lifestyle risk factors, and family history of disease. Counseling Your health care provider may ask you questions about your:  Alcohol use.  Tobacco use.  Drug use.  Emotional well-being.  Home and relationship well-being.  Sexual activity.  Eating habits.  Work and work Statistician.  Method of birth control.  Menstrual cycle.  Pregnancy history.  Screening You may have the following tests or measurements:  Height, weight, and BMI.  Blood  pressure.  Lipid and cholesterol levels. These may be checked every 5 years, or more frequently if you are over 68 years old.  Skin check.  Lung cancer screening. You may have this screening every year starting at age 70 if you have a 30-pack-year history of smoking and currently smoke or have quit within the past 15 years.  Fecal occult blood test (FOBT) of the stool. You may have this test every year starting at age 56.  Flexible sigmoidoscopy or colonoscopy. You may have a sigmoidoscopy every 5 years or a colonoscopy every 10 years starting at age 51.  Hepatitis C blood test.  Hepatitis B blood test.  Sexually transmitted disease (STD) testing.  Diabetes screening. This is done by checking your blood sugar (glucose) after you have not eaten for a while (fasting). You may have this done every 1-3 years.  Mammogram. This may be done every 1-2 years. Talk to your health care provider about when you should start having regular mammograms. This may depend on whether you have a family history of breast cancer.  BRCA-related cancer screening. This may be done if you have a family history of breast, ovarian, tubal, or peritoneal cancers.  Pelvic exam and Pap test. This may be done every 3 years starting at age 54. Starting at age 80, this may be done every 5 years if you have a Pap test in combination with an HPV test.  Bone density scan. This is done to screen for osteoporosis. You may have this scan if you are at high risk for osteoporosis.  Discuss your test results, treatment options, and if necessary, the need for more tests with your health care provider. Vaccines Your health care provider may recommend certain vaccines, such as:  Influenza vaccine. This is recommended every year.  Tetanus, diphtheria, and acellular pertussis (Tdap, Td) vaccine. You may need a Td booster every 10 years.  Varicella vaccine. You may need this if you have not been vaccinated.  Zoster vaccine. You  may need this after  age 44.  Measles, mumps, and rubella (MMR) vaccine. You may need at least one dose of MMR if you were born in 1957 or later. You may also need a second dose.  Pneumococcal 13-valent conjugate (PCV13) vaccine. You may need this if you have certain conditions and were not previously vaccinated.  Pneumococcal polysaccharide (PPSV23) vaccine. You may need one or two doses if you smoke cigarettes or if you have certain conditions.  Meningococcal vaccine. You may need this if you have certain conditions.  Hepatitis A vaccine. You may need this if you have certain conditions or if you travel or work in places where you may be exposed to hepatitis A.  Hepatitis B vaccine. You may need this if you have certain conditions or if you travel or work in places where you may be exposed to hepatitis B.  Haemophilus influenzae type b (Hib) vaccine. You may need this if you have certain conditions.  Talk to your health care provider about which screenings and vaccines you need and how often you need them. This information is not intended to replace advice given to you by your health care provider. Make sure you discuss any questions you have with your health care provider. Document Released: 04/23/2015 Document Revised: 12/15/2015 Document Reviewed: 01/26/2015 Elsevier Interactive Patient Education  2017 [place]. Raliegh Scobie M.D.

## 2017-01-22 ENCOUNTER — Other Ambulatory Visit (INDEPENDENT_AMBULATORY_CARE_PROVIDER_SITE_OTHER): Payer: Managed Care, Other (non HMO)

## 2017-01-22 ENCOUNTER — Telehealth: Payer: Self-pay | Admitting: *Deleted

## 2017-01-22 DIAGNOSIS — Z0000 Encounter for general adult medical examination without abnormal findings: Secondary | ICD-10-CM | POA: Diagnosis not present

## 2017-01-22 DIAGNOSIS — Z8632 Personal history of gestational diabetes: Secondary | ICD-10-CM

## 2017-01-22 DIAGNOSIS — Z13220 Encounter for screening for lipoid disorders: Secondary | ICD-10-CM

## 2017-01-22 LAB — HEPATIC FUNCTION PANEL
ALT: 9 U/L (ref 0–35)
AST: 12 U/L (ref 0–37)
Albumin: 3.9 g/dL (ref 3.5–5.2)
Alkaline Phosphatase: 41 U/L (ref 39–117)
BILIRUBIN TOTAL: 0.5 mg/dL (ref 0.2–1.2)
Bilirubin, Direct: 0.1 mg/dL (ref 0.0–0.3)
Total Protein: 6.5 g/dL (ref 6.0–8.3)

## 2017-01-22 LAB — BASIC METABOLIC PANEL
BUN: 10 mg/dL (ref 6–23)
CALCIUM: 9.1 mg/dL (ref 8.4–10.5)
CHLORIDE: 105 meq/L (ref 96–112)
CO2: 26 mEq/L (ref 19–32)
CREATININE: 0.74 mg/dL (ref 0.40–1.20)
GFR: 87.86 mL/min (ref >60.00)
Glucose, Bld: 97 mg/dL (ref 70–99)
Potassium: 4.8 mEq/L (ref 3.5–5.1)
Sodium: 138 mEq/L (ref 135–145)

## 2017-01-22 LAB — CBC WITH DIFFERENTIAL/PLATELET
BASOS ABS: 0 10*3/uL (ref 0.0–0.1)
Basophils Relative: 0.9 % (ref 0.0–3.0)
EOS PCT: 0.9 % (ref 0.0–5.0)
Eosinophils Absolute: 0 10*3/uL (ref 0.0–0.7)
HEMATOCRIT: 40.2 % (ref 36.0–46.0)
HEMOGLOBIN: 13 g/dL (ref 12.0–15.0)
Lymphocytes Relative: 20.8 % (ref 12.0–46.0)
Lymphs Abs: 1.1 10*3/uL (ref 0.7–4.0)
MCHC: 32.2 g/dL (ref 30.0–36.0)
MCV: 88.7 fl (ref 78.0–100.0)
MONO ABS: 0.3 10*3/uL (ref 0.1–1.0)
MONOS PCT: 5.6 % (ref 3.0–12.0)
Neutro Abs: 3.8 10*3/uL (ref 1.4–7.7)
Neutrophils Relative %: 71.8 % (ref 43.0–77.0)
Platelets: 260 10*3/uL (ref 150.0–400.0)
RBC: 4.53 Mil/uL (ref 3.87–5.11)
RDW: 14.3 % (ref 11.5–15.5)
WBC: 5.2 10*3/uL (ref 4.0–10.5)

## 2017-01-22 LAB — LIPID PANEL
CHOL/HDL RATIO: 4
Cholesterol: 195 mg/dL (ref 0–200)
HDL: 43.9 mg/dL (ref >39.00)
LDL CALC: 132 mg/dL — AB (ref 0–99)
NonHDL: 151.51
TRIGLYCERIDES: 100 mg/dL (ref 0.0–149.0)
VLDL: 20 mg/dL (ref 0.0–40.0)

## 2017-01-22 LAB — TSH: TSH: 1 u[IU]/mL (ref 0.35–4.50)

## 2017-01-22 LAB — HEMOGLOBIN A1C: HEMOGLOBIN A1C: 5.3 % (ref 4.6–6.5)

## 2017-01-22 NOTE — Telephone Encounter (Signed)
Form filled out and placed in Dr Velora Mediate folder to sign.  There are two missing sections - Waist Circumference and blood sugar, patient will have to have these done when she picks up form so that they may be written in.   Please advise Dr Regis Bill, thanks.

## 2017-01-22 NOTE — Telephone Encounter (Signed)
Copied from [id number]. Topic: General - Other >> Jan 22, 2017 10:35 AM Suggs, Tammy E wrote: Reason for CRM: Patient dropped off well form to be filled out by Doctor.

## 2017-01-23 NOTE — Telephone Encounter (Signed)
Form completed and signed x WC  [employer]

## 2017-01-23 NOTE — Telephone Encounter (Signed)
Pt scheduled for 01/26/17 at 430 for NV for waist circ.  Pt also picking up labs which are attached.  Nothing further needed.

## 2017-01-26 ENCOUNTER — Ambulatory Visit: Payer: Managed Care, Other (non HMO)

## 2017-01-26 DIAGNOSIS — Z008 Encounter for other general examination: Secondary | ICD-10-CM

## 2017-01-26 DIAGNOSIS — Z0189 Encounter for other specified special examinations: Principal | ICD-10-CM

## 2017-01-26 NOTE — Progress Notes (Signed)
Pt here for waist circumference for form, 31 cm. Pt kept form and a copy was made to be scanned into chart.

## 2017-12-17 ENCOUNTER — Other Ambulatory Visit: Payer: Self-pay | Admitting: Obstetrics and Gynecology

## 2017-12-17 DIAGNOSIS — Z1231 Encounter for screening mammogram for malignant neoplasm of breast: Secondary | ICD-10-CM

## 2018-01-18 ENCOUNTER — Ambulatory Visit
Admission: RE | Admit: 2018-01-18 | Discharge: 2018-01-18 | Disposition: A | Discharge status: Home / Self Care | Payer: Managed Care, Other (non HMO) | Source: Ambulatory Visit | Attending: Obstetrics and Gynecology | Admitting: Obstetrics and Gynecology

## 2018-01-18 DIAGNOSIS — Z1231 Encounter for screening mammogram for malignant neoplasm of breast: Secondary | ICD-10-CM

## 2018-01-22 ENCOUNTER — Ambulatory Visit (INDEPENDENT_AMBULATORY_CARE_PROVIDER_SITE_OTHER): Payer: Managed Care, Other (non HMO)

## 2018-01-22 DIAGNOSIS — Z23 Encounter for immunization: Secondary | ICD-10-CM

## 2018-04-01 ENCOUNTER — Encounter: Payer: Managed Care, Other (non HMO) | Admitting: Internal Medicine

## 2018-04-05 NOTE — Progress Notes (Signed)
Chief Complaint  Patient presents with  . Annual Exam    Pt states that she has been getting migraines that she believes it is related to menstrual cycle. Been going on for at least 6 months    HPI: Patient  Megan Armstrong  52 y.o. 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 HIV Screening  10/30/1980  . COLONOSCOPY  10/31/2015  . PAP SMEAR-Modifier  03/10/2016  . MAMMOGRAM  01/19/2020  . TETANUS/TDAP  02/05/2024  . INFLUENZA VACCINE  Completed   Health Maintenance Review LIFESTYLE:  Exercise:  2 x per week walking .  Tobacco/ETS: no Alcohol:   no Sugar beverages: Sleep: 6  Drug use: no HH of  4  Work:about  40 + hours    And home  Reg periods  Last pap  October this year   Dr Genevieve Norlander.  And mammogram .  Dr Collene Mares  10 years utd   ? 2018 ?   Mom passed in her 33s     ROS:   Headaches   often pre menstrual    Poss stress .    Sometimes  Skips lunch .     On top forehead of head.   Some pulsating.   advil migraine helps.  GEN/ HEENT: No fever, significant weight changes sweats headaches vision problems hearing changes, CV/ PULM; No chest pain shortness of breath cough, syncope,edema  change in exercise tolerance. GI /GU: No adominal pain, vomiting, change in bowel habits. No blood in the stool. No significant GU symptoms. SKIN/HEME: ,no acute skin rashes suspicious lesions or bleeding. No lymphadenopathy, nodules, masses.  NEURO/ PSYCH:  No neurologic signs such as weakness numbness. No depression anxiety. IMM/ Allergy: No unusual infections.  Allergy .   REST of 12 system review negative except as per HPI   Past Medical History:  Diagnosis Date  . Allergic rhinitis   . DM mellitus, gestational   .  Endometriosis   . Hx of endometriosis    rectal vag with laser surgery   . Seasonal allergies   . TB skin/subcutaneous    postive tb skin test    Past Surgical History:  Procedure Laterality Date  . TONSILLECTOMY      Family History  Problem Relation Age of Onset  . Migraines Sister   . Asthma Other   . Diabetes Other   . Hypertension Other   . Sudden death Other     Social History   Socioeconomic History  . Marital status: Married    Spouse name: Not on file  . Number of children: Not on file  . Years of education: Not on file  . Highest education level: Not on file  Occupational History  . Not on file  Social Needs  . Financial resource strain: Not on file  . Food insecurity:    Worry: Not on file    Inability: Not on file  . Transportation needs:    Medical: Not on file    Non-medical: Not on file  Tobacco Use  . Smoking status: Never Smoker  . Smokeless tobacco: Never Used  Substance and Sexual Activity  . Alcohol use: No  . Drug use:  No  . Sexual activity: Not on file  Lifestyle  . Physical activity:    Days per week: Not on file    Minutes per session: Not on file  . Stress: Not on file  Relationships  . Social connections:    Talks on phone: Not on file    Gets together: Not on file    Attends religious service: Not on file    Active member of club or organization: Not on file    Attends meetings of clubs or organizations: Not on file    Relationship status: Not on file  Other Topics Concern  . Not on file  Social History Narrative   Borderline spirometry   Married   Regular exercise   decff tea, some coke   2 children at home hh of 4    Pharmacy school internship   Family form [place]    Outpatient Medications Prior to Visit  Medication Sig Dispense Refill  . loratadine (CLARITIN REDITABS) 10 MG dissolvable tablet Take 10 mg by mouth daily.      . mometasone (NASONEX) 50 MCG/ACT nasal spray Place 2 sprays into the nose daily.  Each nostril 17 g 11  . Multiple Vitamins-Minerals (CENTRUM ULTRA WOMENS PO) Take by mouth.    . doxycycline (VIBRAMYCIN) 100 MG capsule Take 1 capsule (100 mg total) by mouth 2 (two) times daily. 20 capsule 0   No facility-administered medications prior to visit.      EXAM:  BP 118/62 (BP Location: Right Arm, Patient Position: Sitting, Cuff Size: Normal)   Temp 98.2 F (36.8 C) (Oral)   Ht 5' 2"  (1.575 m)   Wt 110 lb 6.4 oz (50.1 kg)   BMI 20.19 kg/m   Body mass index is 20.19 kg/m. Wt Readings from Last 3 Encounters:  04/08/18 110 lb 6.4 oz (50.1 kg)  01/19/17 107 lb (48.5 kg)  12/28/15 109 lb 9.6 oz (49.7 kg)    Physical Exam: Vital signs reviewed [id number] is a well-developed well-nourished alert cooperative    who appears  stated age in no acute distress.  HEENT: normocephalic atraumatic , Eyes: PERRL EOM's full, conjunctiva clear, Nares: paten,t no deformity discharge or tenderness., Ears: no deformity EAC's clear TMs with normal landmarks. Mouth: clear OP, no lesions, edema.  Moist mucous membranes. Dentition in adequate repair. NECK: supple without masses, thyromegaly or bruits. CHEST/PULM:  Clear to auscultation and percussion breath sounds equal no wheeze , rales or rhonchi. No chest wall deformities or tenderness. Breast: normal by inspection . No dimpling, discharge, masses, tenderness or discharge . CV: PMI is nondisplaced, S1 S2 no gallops, murmurs, rubs. Peripheral pulses are full without delay.No JVD .  ABDOMEN: Bowel sounds normal nontender  No guard or rebound, no hepato splenomegal no CVA tenderness.  No hernia. Extremtities:  No clubbing cyanosis or edema, no acute joint swelling or redness no focal atrophy NEURO:  Oriented x3, cranial nerves 3-12 appear to be intact, no obvious focal weakness,gait within normal limits no abnormal reflexes or asymmetrical SKIN: No acute rashes normal turgor, color, no bruising or petechiae. PSYCH: Oriented, good eye contact,  no obvious depression anxiety, cognition and judgment appear normal. LN: no cervical axillary inguinal adenopathy  Lab Results  Component Value Date   WBC 7.0 04/08/2018   HGB 13.2 04/08/2018   HCT 39.7 04/08/2018   PLT 261.0 04/08/2018   GLUCOSE 86 04/08/2018   CHOL 219 (H) 04/08/2018   TRIG 93.0 04/08/2018   HDL 46.90 04/08/2018  LDLCALC 153 (H) 04/08/2018   ALT 11 04/08/2018   AST 12 04/08/2018   NA 135 04/08/2018   K 4.1 04/08/2018   CL 103 04/08/2018   CREATININE 0.81 04/08/2018   BUN 16 04/08/2018   CO2 26 04/08/2018   TSH 1.33 04/08/2018   HGBA1C 5.4 04/08/2018    BP Readings from Last 3 Encounters:  04/08/18 118/62  01/19/17 92/70  12/28/15 120/80    Ate  9 am and apple  At lunch .   ASSESSMENT AND PLAN:  Discussed the following assessment and plan:  Visit for preventive health examination - Plan: Basic metabolic panel, CBC with Differential/Platelet, Hepatic function panel, Lipid panel, TSH, Hemoglobin A1c  Hx gestational diabetes - Plan: Basic metabolic panel, CBC with Differential/Platelet, Hepatic function panel, Lipid panel, TSH, Hemoglobin A1c  Screening, lipid - Plan: Basic metabolic panel, CBC with Differential/Platelet, Hepatic function panel, Lipid panel, TSH, Hemoglobin A1c Reviewed  Lab nf today and will complete form   Risk benefit of medication discussed. tamiflu for prophylax at this time however if early sx can rx  Patient Care Team: Arden Tinoco, Standley Brooking, MD as PCP - General Brien Few, MD as Consulting Physician (Obstetrics and Gynecology) Juanita Craver, MD as Consulting Physician (Gastroenterology) Patient Instructions  Continue lifestyle intervention healthy eating and exercise . Suspect headaches related to  Menses and  Lifestyle issues sleep skipping meals  hyudration etc  Fu if  persistent or progressive  At this time I don't think  tamiflu prophylaxis would be that helpful but if any sx at all contact us .   Will complete form when  data avaliable    Health Maintenance, Female Adopting a healthy lifestyle and getting preventive care can go a long way to promote health and wellness. Talk with your health care provider about what schedule of regular examinations is right for you. This is a good chance for you to check in with your provider about disease prevention and staying healthy. In between checkups, there are plenty of things you can do on your own. Experts have done a lot of research about which lifestyle changes and preventive measures are most likely to keep you healthy. Ask your health care provider for more information. Weight and diet Eat a healthy diet  Be sure to include plenty of vegetables, fruits, low-fat dairy products, and lean protein.  Do not eat a lot of foods high in solid fats, added sugars, or salt.  Get regular exercise. This is one of the most important things you can do for your health. ? Most adults should exercise for at least 150 minutes each week. The exercise should increase your heart rate and make you sweat (moderate-intensity exercise). ? Most adults should also do strengthening exercises at least twice a week. This is in addition to the moderate-intensity exercise. Maintain a healthy weight  Body mass index (BMI) is a measurement that can be used to identify possible weight problems. It estimates body fat based on height and weight. Your health care provider can help determine your BMI and help you achieve or maintain a healthy weight.  For females 65 years of age and older: ? A BMI below 18.5 is considered underweight. ? A BMI of 18.5 to 24.9 is normal. ? A BMI of 25 to 29.9 is considered overweight. ? A BMI of 30 and above is considered obese. Watch levels of cholesterol and blood lipids  You should start having your blood tested for lipids and cholesterol at 52 years  of age, then have this test every 5 years.  You may need to have your cholesterol levels checked more often  if: ? Your lipid or cholesterol levels are high. ? You are older than 52 years of age. ? You are at high risk for heart disease. Cancer screening Lung Cancer  Lung cancer screening is recommended for adults 63-9 years old who are at high risk for lung cancer because of a history of smoking.  A yearly low-dose CT scan of the lungs is recommended for people who: ? Currently smoke. ? Have quit within the past 15 years. ? Have at least a 30-pack-year history of smoking. A pack year is smoking an average of one pack of cigarettes a day for 1 year.  Yearly screening should continue until it has been 15 years since you quit.  Yearly screening should stop if you develop a health problem that would prevent you from having lung cancer treatment. Breast Cancer  Practice breast self-awareness. This means understanding how your breasts normally appear and feel.  It also means doing regular breast self-exams. Let your health care provider know about any changes, no matter how small.  If you are in your 20s or 30s, you should have a clinical breast exam (CBE) by a health care provider every 1-3 years as part of a regular health exam.  If you are 45 or older, have a CBE every year. Also consider having a breast X-ray (mammogram) every year.  If you have a family history of breast cancer, talk to your health care provider about genetic screening.  If you are at high risk for breast cancer, talk to your health care provider about having an MRI and a mammogram every year.  Breast cancer gene (BRCA) assessment is recommended for women who have family members with BRCA-related cancers. BRCA-related cancers include: ? Breast. ? Ovarian. ? Tubal. ? Peritoneal cancers.  Results of the assessment will determine the need for genetic counseling and BRCA1 and BRCA2 testing. Cervical Cancer Your health care provider may recommend that you be screened regularly for cancer of the pelvic organs (ovaries,  uterus, and vagina). This screening involves a pelvic examination, including checking for microscopic changes to the surface of your cervix (Pap test). You may be encouraged to have this screening done every 3 years, beginning at age 68.  For women ages 49-65, health care providers may recommend pelvic exams and Pap testing every 3 years, or they may recommend the Pap and pelvic exam, combined with testing for human papilloma virus (HPV), every 5 years. Some types of HPV increase your risk of cervical cancer. Testing for HPV may also be done on women of any age with unclear Pap test results.  Other health care providers may not recommend any screening for nonpregnant women who are considered low risk for pelvic cancer and who do not have symptoms. Ask your health care provider if a screening pelvic exam is right for you.  If you have had past treatment for cervical cancer or a condition that could lead to cancer, you need Pap tests and screening for cancer for at least 20 years after your treatment. If Pap tests have been discontinued, your risk factors (such as having a new sexual partner) need to be reassessed to determine if screening should resume. Some women have medical problems that increase the chance of getting cervical cancer. In these cases, your health care provider may recommend more frequent screening and Pap tests. Colorectal Cancer  This  type of cancer can be detected and often prevented.  Routine colorectal cancer screening usually begins at 52 years of age and continues through 53 years of age.  Your health care provider may recommend screening at an earlier age if you have risk factors for colon cancer.  Your health care provider may also recommend using home test kits to check for hidden blood in the stool.  A small camera at the end of a tube can be used to examine your colon directly (sigmoidoscopy or colonoscopy). This is done to check for the earliest forms of colorectal  cancer.  Routine screening usually begins at age 34.  Direct examination of the colon should be repeated every 5-10 years through 52 years of age. However, you may need to be screened more often if early forms of precancerous polyps or small growths are found. Skin Cancer  Check your skin from head to toe regularly.  Tell your health care provider about any new moles or changes in moles, especially if there is a change in a mole's shape or color.  Also tell your health care provider if you have a mole that is larger than the size of a pencil eraser.  Always use sunscreen. Apply sunscreen liberally and repeatedly throughout the day.  Protect yourself by wearing long sleeves, pants, a wide-brimmed hat, and sunglasses whenever you are outside. Heart disease, diabetes, and high blood pressure  High blood pressure causes heart disease and increases the risk of stroke. High blood pressure is more likely to develop in: ? People who have blood pressure in the high end of the normal range (130-139/85-89 mm Hg). ? People who are overweight or obese. ? People who are African American.  If you are 63-24 years of age, have your blood pressure checked every 3-5 years. If you are 55 years of age or older, have your blood pressure checked every year. You should have your blood pressure measured twice-once when you are at a hospital or clinic, and once when you are not at a hospital or clinic. Record the average of the two measurements. To check your blood pressure when you are not at a hospital or clinic, you can use: ? An automated blood pressure machine at a pharmacy. ? A home blood pressure monitor.  If you are between 71 years and 70 years old, ask your health care provider if you should take aspirin to prevent strokes.  Have regular diabetes screenings. This involves taking a blood sample to check your fasting blood sugar level. ? If you are at a normal weight and have a low risk for diabetes,  have this test once every three years after 52 years of age. ? If you are overweight and have a high risk for diabetes, consider being tested at a younger age or more often. Preventing infection Hepatitis B  If you have a higher risk for hepatitis B, you should be screened for this virus. You are considered at high risk for hepatitis B if: ? You were born in a country where hepatitis B is common. Ask your health care provider which countries are considered high risk. ? Your parents were born in a high-risk country, and you have not been immunized against hepatitis B (hepatitis B vaccine). ? You have HIV or AIDS. ? You use needles to inject street drugs. ? You live with someone who has hepatitis B. ? You have had sex with someone who has hepatitis B. ? You get hemodialysis treatment. ? You  take certain medicines for conditions, including cancer, organ transplantation, and autoimmune conditions. Hepatitis C  Blood testing is recommended for: ? Everyone born from [birth year] through 1965. ? Anyone with known risk factors for hepatitis C. Sexually transmitted infections (STIs)  You should be screened for sexually transmitted infections (STIs) including gonorrhea and chlamydia if: ? You are sexually active and are younger than 52 years of age. ? You are older than 52 years of age and your health care provider tells you that you are at risk for this type of infection. ? Your sexual activity has changed since you were last screened and you are at an increased risk for chlamydia or gonorrhea. Ask your health care provider if you are at risk.  If you do not have HIV, but are at risk, it may be recommended that you take a prescription medicine daily to prevent HIV infection. This is called pre-exposure prophylaxis (PrEP). You are considered at risk if: ? You are sexually active and do not regularly use condoms or know the HIV status of your partner(s). ? You take drugs by injection. ? You are sexually  active with a partner who has HIV. Talk with your health care provider about whether you are at high risk of being infected with HIV. If you choose to begin PrEP, you should first be tested for HIV. You should then be tested every 3 months for as long as you are taking PrEP. Pregnancy  If you are premenopausal and you may become pregnant, ask your health care provider about preconception counseling.  If you may become pregnant, take 400 to 800 micrograms (mcg) of folic acid every day.  If you want to prevent pregnancy, talk to your health care provider about birth control (contraception). Osteoporosis and menopause  Osteoporosis is a disease in which the bones lose minerals and strength with aging. This can result in serious bone fractures. Your risk for osteoporosis can be identified using a bone density scan.  If you are 30 years of age or older, or if you are at risk for osteoporosis and fractures, ask your health care provider if you should be screened.  Ask your health care provider whether you should take a calcium or vitamin D supplement to lower your risk for osteoporosis.  Menopause may have certain physical symptoms and risks.  Hormone replacement therapy may reduce some of these symptoms and risks. Talk to your health care provider about whether hormone replacement therapy is right for you. Follow these instructions at home:  Schedule regular health, dental, and eye exams.  Stay current with your immunizations.  Do not use any tobacco products including cigarettes, chewing tobacco, or electronic cigarettes.  If you are pregnant, do not drink alcohol.  If you are breastfeeding, limit how much and how often you drink alcohol.  Limit alcohol intake to no more than 1 drink per day for nonpregnant women. One drink equals 12 ounces of beer, 5 ounces of wine, or 1 ounces of hard liquor.  Do not use street drugs.  Do not share needles.  Ask your health care provider for  help if you need support or information about quitting drugs.  Tell your health care provider if you often feel depressed.  Tell your health care provider if you have ever been abused or do not feel safe at home. This information is not intended to replace advice given to you by your health care provider. Make sure you discuss any questions you have with your health  care provider. Document Released: 10/10/2010 Document Revised: 09/02/2015 Document Reviewed: 12/29/2014 Elsevier Interactive Patient Education  2019 [place]. Francile Woolford M.D.

## 2018-04-08 ENCOUNTER — Encounter: Payer: Self-pay | Admitting: Internal Medicine

## 2018-04-08 ENCOUNTER — Ambulatory Visit (INDEPENDENT_AMBULATORY_CARE_PROVIDER_SITE_OTHER): Payer: Managed Care, Other (non HMO) | Admitting: Internal Medicine

## 2018-04-08 VITALS — BP 118/62 | Temp 98.2°F | Ht 62.0 in | Wt 110.4 lb

## 2018-04-08 DIAGNOSIS — Z0000 Encounter for general adult medical examination without abnormal findings: Secondary | ICD-10-CM | POA: Diagnosis not present

## 2018-04-08 DIAGNOSIS — Z13220 Encounter for screening for lipoid disorders: Secondary | ICD-10-CM

## 2018-04-08 DIAGNOSIS — Z8632 Personal history of gestational diabetes: Secondary | ICD-10-CM

## 2018-04-08 LAB — HEPATIC FUNCTION PANEL
ALBUMIN: 4.1 g/dL (ref 3.5–5.2)
ALK PHOS: 40 U/L (ref 39–117)
ALT: 11 U/L (ref 0–35)
AST: 12 U/L (ref 0–37)
BILIRUBIN DIRECT: 0.1 mg/dL (ref 0.0–0.3)
Total Bilirubin: 0.4 mg/dL (ref 0.2–1.2)
Total Protein: 6.7 g/dL (ref 6.0–8.3)

## 2018-04-08 LAB — CBC WITH DIFFERENTIAL/PLATELET
BASOS ABS: 0 10*3/uL (ref 0.0–0.1)
Basophils Relative: 0.6 % (ref 0.0–3.0)
EOS ABS: 0.1 10*3/uL (ref 0.0–0.7)
Eosinophils Relative: 0.8 % (ref 0.0–5.0)
HCT: 39.7 % (ref 36.0–46.0)
Hemoglobin: 13.2 g/dL (ref 12.0–15.0)
Lymphocytes Relative: 24.4 % (ref 12.0–46.0)
Lymphs Abs: 1.7 10*3/uL (ref 0.7–4.0)
MCHC: 33.2 g/dL (ref 30.0–36.0)
MCV: 86 fl (ref 78.0–100.0)
MONOS PCT: 5.7 % (ref 3.0–12.0)
Monocytes Absolute: 0.4 10*3/uL (ref 0.1–1.0)
NEUTROS ABS: 4.8 10*3/uL (ref 1.4–7.7)
Neutrophils Relative %: 68.5 % (ref 43.0–77.0)
PLATELETS: 261 10*3/uL (ref 150.0–400.0)
RBC: 4.61 Mil/uL (ref 3.87–5.11)
RDW: 14.4 % (ref 11.5–15.5)
WBC: 7 10*3/uL (ref 4.0–10.5)

## 2018-04-08 LAB — LIPID PANEL
CHOL/HDL RATIO: 5
Cholesterol: 219 mg/dL — ABNORMAL HIGH (ref 0–200)
HDL: 46.9 mg/dL (ref >39.00)
LDL Cholesterol: 153 mg/dL — ABNORMAL HIGH (ref 0–99)
NONHDL: 171.82
Triglycerides: 93 mg/dL (ref 0.0–149.0)
VLDL: 18.6 mg/dL (ref 0.0–40.0)

## 2018-04-08 LAB — BASIC METABOLIC PANEL
BUN: 16 mg/dL (ref 6–23)
CALCIUM: 9.2 mg/dL (ref 8.4–10.5)
CO2: 26 mEq/L (ref 19–32)
CREATININE: 0.81 mg/dL (ref 0.40–1.20)
Chloride: 103 mEq/L (ref 96–112)
GFR: 78.78 mL/min (ref >60.00)
Glucose, Bld: 86 mg/dL (ref 70–99)
Potassium: 4.1 mEq/L (ref 3.5–5.1)
Sodium: 135 mEq/L (ref 135–145)

## 2018-04-08 LAB — TSH: TSH: 1.33 u[IU]/mL (ref 0.35–4.50)

## 2018-04-08 LAB — HEMOGLOBIN A1C: Hgb A1c MFr Bld: 5.4 % (ref 4.6–6.5)

## 2018-04-08 NOTE — Patient Instructions (Signed)
Continue lifestyle intervention healthy eating and exercise . Suspect headaches related to  Menses and  Lifestyle issues sleep skipping meals  hyudration etc  Fu if  persistent or progressive  At this time I don't think  tamiflu prophylaxis would be that helpful but if any sx at all contact us .   Will complete form when data avaliable    Health Maintenance, Female Adopting a healthy lifestyle and getting preventive care can go a long way to promote health and wellness. Talk with your health care provider about what schedule of regular examinations is right for you. This is a good chance for you to check in with your provider about disease prevention and staying healthy. In between checkups, there are plenty of things you can do on your own. Experts have done a lot of research about which lifestyle changes and preventive measures are most likely to keep you healthy. Ask your health care provider for more information. Weight and diet Eat a healthy diet  Be sure to include plenty of vegetables, fruits, low-fat dairy products, and lean protein.  Do not eat a lot of foods high in solid fats, added sugars, or salt.  Get regular exercise. This is one of the most important things you can do for your health. ? Most adults should exercise for at least 150 minutes each week. The exercise should increase your heart rate and make you sweat (moderate-intensity exercise). ? Most adults should also do strengthening exercises at least twice a week. This is in addition to the moderate-intensity exercise. Maintain a healthy weight  Body mass index (BMI) is a measurement that can be used to identify possible weight problems. It estimates body fat based on height and weight. Your health care provider can help determine your BMI and help you achieve or maintain a healthy weight.  For females 9 years of age and older: ? A BMI below 18.5 is considered underweight. ? A BMI of 18.5 to 24.9 is normal. ? A BMI of  25 to 29.9 is considered overweight. ? A BMI of 30 and above is considered obese. Watch levels of cholesterol and blood lipids  You should start having your blood tested for lipids and cholesterol at 52 years of age, then have this test every 5 years.  You may need to have your cholesterol levels checked more often if: ? Your lipid or cholesterol levels are high. ? You are older than 52 years of age. ? You are at high risk for heart disease. Cancer screening Lung Cancer  Lung cancer screening is recommended for adults 8-37 years old who are at high risk for lung cancer because of a history of smoking.  A yearly low-dose CT scan of the lungs is recommended for people who: ? Currently smoke. ? Have quit within the past 15 years. ? Have at least a 30-pack-year history of smoking. A pack year is smoking an average of one pack of cigarettes a day for 1 year.  Yearly screening should continue until it has been 15 years since you quit.  Yearly screening should stop if you develop a health problem that would prevent you from having lung cancer treatment. Breast Cancer  Practice breast self-awareness. This means understanding how your breasts normally appear and feel.  It also means doing regular breast self-exams. Let your health care provider know about any changes, no matter how small.  If you are in your 20s or 30s, you should have a clinical breast exam (CBE) by  a health care provider every 1-3 years as part of a regular health exam.  If you are 82 or older, have a CBE every year. Also consider having a breast X-ray (mammogram) every year.  If you have a family history of breast cancer, talk to your health care provider about genetic screening.  If you are at high risk for breast cancer, talk to your health care provider about having an MRI and a mammogram every year.  Breast cancer gene (BRCA) assessment is recommended for women who have family members with BRCA-related cancers.  BRCA-related cancers include: ? Breast. ? Ovarian. ? Tubal. ? Peritoneal cancers.  Results of the assessment will determine the need for genetic counseling and BRCA1 and BRCA2 testing. Cervical Cancer Your health care provider may recommend that you be screened regularly for cancer of the pelvic organs (ovaries, uterus, and vagina). This screening involves a pelvic examination, including checking for microscopic changes to the surface of your cervix (Pap test). You may be encouraged to have this screening done every 3 years, beginning at age 54.  For women ages 66-65, health care providers may recommend pelvic exams and Pap testing every 3 years, or they may recommend the Pap and pelvic exam, combined with testing for human papilloma virus (HPV), every 5 years. Some types of HPV increase your risk of cervical cancer. Testing for HPV may also be done on women of any age with unclear Pap test results.  Other health care providers may not recommend any screening for nonpregnant women who are considered low risk for pelvic cancer and who do not have symptoms. Ask your health care provider if a screening pelvic exam is right for you.  If you have had past treatment for cervical cancer or a condition that could lead to cancer, you need Pap tests and screening for cancer for at least 20 years after your treatment. If Pap tests have been discontinued, your risk factors (such as having a new sexual partner) need to be reassessed to determine if screening should resume. Some women have medical problems that increase the chance of getting cervical cancer. In these cases, your health care provider may recommend more frequent screening and Pap tests. Colorectal Cancer  This type of cancer can be detected and often prevented.  Routine colorectal cancer screening usually begins at 52 years of age and continues through 52 years of age.  Your health care provider may recommend screening at an earlier age if you  have risk factors for colon cancer.  Your health care provider may also recommend using home test kits to check for hidden blood in the stool.  A small camera at the end of a tube can be used to examine your colon directly (sigmoidoscopy or colonoscopy). This is done to check for the earliest forms of colorectal cancer.  Routine screening usually begins at age 87.  Direct examination of the colon should be repeated every 5-10 years through 52 years of age. However, you may need to be screened more often if early forms of precancerous polyps or small growths are found. Skin Cancer  Check your skin from head to toe regularly.  Tell your health care provider about any new moles or changes in moles, especially if there is a change in a mole's shape or color.  Also tell your health care provider if you have a mole that is larger than the size of a pencil eraser.  Always use sunscreen. Apply sunscreen liberally and repeatedly throughout the day.  Protect yourself by wearing long sleeves, pants, a wide-brimmed hat, and sunglasses whenever you are outside. Heart disease, diabetes, and high blood pressure  High blood pressure causes heart disease and increases the risk of stroke. High blood pressure is more likely to develop in: ? People who have blood pressure in the high end of the normal range (130-139/85-89 mm Hg). ? People who are overweight or obese. ? People who are African American.  If you are 51-73 years of age, have your blood pressure checked every 3-5 years. If you are 29 years of age or older, have your blood pressure checked every year. You should have your blood pressure measured twice-once when you are at a hospital or clinic, and once when you are not at a hospital or clinic. Record the average of the two measurements. To check your blood pressure when you are not at a hospital or clinic, you can use: ? An automated blood pressure machine at a pharmacy. ? A home blood pressure  monitor.  If you are between 54 years and 54 years old, ask your health care provider if you should take aspirin to prevent strokes.  Have regular diabetes screenings. This involves taking a blood sample to check your fasting blood sugar level. ? If you are at a normal weight and have a low risk for diabetes, have this test once every three years after 52 years of age. ? If you are overweight and have a high risk for diabetes, consider being tested at a younger age or more often. Preventing infection Hepatitis B  If you have a higher risk for hepatitis B, you should be screened for this virus. You are considered at high risk for hepatitis B if: ? You were born in a country where hepatitis B is common. Ask your health care provider which countries are considered high risk. ? Your parents were born in a high-risk country, and you have not been immunized against hepatitis B (hepatitis B vaccine). ? You have HIV or AIDS. ? You use needles to inject street drugs. ? You live with someone who has hepatitis B. ? You have had sex with someone who has hepatitis B. ? You get hemodialysis treatment. ? You take certain medicines for conditions, including cancer, organ transplantation, and autoimmune conditions. Hepatitis C  Blood testing is recommended for: ? Everyone born from [birth year] through 1965. ? Anyone with known risk factors for hepatitis C. Sexually transmitted infections (STIs)  You should be screened for sexually transmitted infections (STIs) including gonorrhea and chlamydia if: ? You are sexually active and are younger than 52 years of age. ? You are older than 52 years of age and your health care provider tells you that you are at risk for this type of infection. ? Your sexual activity has changed since you were last screened and you are at an increased risk for chlamydia or gonorrhea. Ask your health care provider if you are at risk.  If you do not have HIV, but are at risk, it may be  recommended that you take a prescription medicine daily to prevent HIV infection. This is called pre-exposure prophylaxis (PrEP). You are considered at risk if: ? You are sexually active and do not regularly use condoms or know the HIV status of your partner(s). ? You take drugs by injection. ? You are sexually active with a partner who has HIV. Talk with your health care provider about whether you are at high risk of being infected with HIV.  If you choose to begin PrEP, you should first be tested for HIV. You should then be tested every 3 months for as long as you are taking PrEP. Pregnancy  If you are premenopausal and you may become pregnant, ask your health care provider about preconception counseling.  If you may become pregnant, take 400 to 800 micrograms (mcg) of folic acid every day.  If you want to prevent pregnancy, talk to your health care provider about birth control (contraception). Osteoporosis and menopause  Osteoporosis is a disease in which the bones lose minerals and strength with aging. This can result in serious bone fractures. Your risk for osteoporosis can be identified using a bone density scan.  If you are 32 years of age or older, or if you are at risk for osteoporosis and fractures, ask your health care provider if you should be screened.  Ask your health care provider whether you should take a calcium or vitamin D supplement to lower your risk for osteoporosis.  Menopause may have certain physical symptoms and risks.  Hormone replacement therapy may reduce some of these symptoms and risks. Talk to your health care provider about whether hormone replacement therapy is right for you. Follow these instructions at home:  Schedule regular health, dental, and eye exams.  Stay current with your immunizations.  Do not use any tobacco products including cigarettes, chewing tobacco, or electronic cigarettes.  If you are pregnant, do not drink alcohol.  If you are  breastfeeding, limit how much and how often you drink alcohol.  Limit alcohol intake to no more than 1 drink per day for nonpregnant women. One drink equals 12 ounces of beer, 5 ounces of wine, or 1 ounces of hard liquor.  Do not use street drugs.  Do not share needles.  Ask your health care provider for help if you need support or information about quitting drugs.  Tell your health care provider if you often feel depressed.  Tell your health care provider if you have ever been abused or do not feel safe at home. This information is not intended to replace advice given to you by your health care provider. Make sure you discuss any questions you have with your health care provider. Document Released: 10/10/2010 Document Revised: 09/02/2015 Document Reviewed: 12/29/2014 Elsevier Interactive Patient Education  2019 [employer].

## 2018-04-11 NOTE — Progress Notes (Signed)
L/m for patient to call back

## 2018-05-16 ENCOUNTER — Other Ambulatory Visit: Payer: Self-pay | Admitting: Internal Medicine

## 2018-05-16 MED ORDER — OSELTAMIVIR PHOSPHATE 75 MG PO CAPS: 75.0000 mg | ORAL_CAPSULE | Freq: Two times a day (BID) | ORAL | 0 refills | Status: DC

## 2018-05-16 NOTE — Progress Notes (Signed)
duaghter with inf a today   Written rx if needed and develops sx  She has been immunized

## 2018-08-06 ENCOUNTER — Encounter: Payer: Self-pay | Admitting: Internal Medicine

## 2018-08-06 ENCOUNTER — Ambulatory Visit (INDEPENDENT_AMBULATORY_CARE_PROVIDER_SITE_OTHER): Payer: Managed Care, Other (non HMO) | Admitting: Internal Medicine

## 2018-08-06 ENCOUNTER — Other Ambulatory Visit: Payer: Self-pay

## 2018-08-06 DIAGNOSIS — R922 Inconclusive mammogram: Secondary | ICD-10-CM | POA: Diagnosis not present

## 2018-08-06 DIAGNOSIS — N6459 Other signs and symptoms in breast: Secondary | ICD-10-CM

## 2018-08-06 NOTE — Progress Notes (Signed)
Virtual Visit via Video Note  I connected with@ on 08/06/18 at  9:45 AM EDT by a video enabled telemedicine application and verified that I am speaking with the correct person using two identifiers. Location patient: home Location provider:work  office Persons participating in the virtual visit: patient, provider  [place] national recommendations  regarding COVID 19 pandemic   video visit is advised over in office visit for this patient.  Discussed the limitations of evaluation and management by telemedicine and  availability of in person appointments. The patient expressed understanding and agreed to proceed.   HPI: Megan Armstrong Presents for video visit because of concerns about abnormal breast exam on the right.  She has been reported to have very dense breast is still having regular periods.  She had noticed over the last months to year some asymmetry right breast firmness and may be an indentation around the right nipple without any discharge pain or dramatic change. She did have a breast exam by her GYN last fall with a normal mammogram. She is having some concern because thinks it might be worse her husband who is a physician did a breast exam and did notice that it was different than the other side .  She is premenstrual today.   ROS: See pertinent positives and negatives per HPI. Past history of endometriosis rectovaginal area and endometrial hyperplasia treated with progesterone in past. Past Medical History:  Diagnosis Date  . Allergic rhinitis   . DM mellitus, gestational   . Endometriosis   . Hx of endometriosis    rectal vag with laser surgery   . Seasonal allergies   . TB skin/subcutaneous    postive tb skin test    Past Surgical History:  Procedure Laterality Date  . TONSILLECTOMY      Family History  Problem Relation Age of Onset  . Migraines Sister   . Asthma Other   . Diabetes Other   . Hypertension Other   . Sudden death Other     Social History    Tobacco Use  . Smoking status: Never Smoker  . Smokeless tobacco: Never Used  Substance Use Topics  . Alcohol use: No  . Drug use: No      Current Outpatient Medications:  .  loratadine (CLARITIN REDITABS) 10 MG dissolvable tablet, Take 10 mg by mouth daily.  , Disp: , Rfl:  .  mometasone (NASONEX) 50 MCG/ACT nasal spray, Place 2 sprays into the nose daily. Each nostril, Disp: 17 g, Rfl: 11 .  Multiple Vitamins-Minerals (CENTRUM ULTRA WOMENS PO), Take by mouth., Disp: , Rfl:  .  oseltamivir (TAMIFLU) 75 MG capsule, Take 1 capsule (75 mg total) by mouth 2 (two) times daily., Disp: 10 capsule, Rfl: 0  EXAM: BP Readings from Last 3 Encounters:  04/08/18 118/62  01/19/17 92/70  12/28/15 120/80    VITALS per patient if applicable:  GENERAL: alert, oriented, appears well and in no acute distress  HEENT: atraumatic, conjunttiva clear, no obvious abnormalities on inspection of external nose and ears  NECK: normal movements of the head and neck  LUNGS: on inspection no signs of respiratory distress, breathing rate appears normal, no obvious gross SOB, gasping or wheezing  CV: no obvious cyanosis  MS: moves all visible extremities without noticeable abnormality  PSYCH/NEURO: pleasant and cooperative, no obvious depression or anxiety, speech and thought processing grossly intact   ASSESSMENT AND PLAN:  Discussed the following assessment and plan:  Abnormal breast finding right  - Plan: MM  DIAG BREAST TOMO UNI RIGHT, US BREAST LTD UNI RIGHT INC AXILLA  Dense breasts - Plan: MM DIAG BREAST TOMO UNI RIGHT, US BREAST LTD UNI RIGHT INC AXILLA Discussed options will send for diagnostic mammogram ultrasound and go from there may have her GYN or breast surgeon examined the area if appropriate however I feel that it is best to do these imaging studies first. Counseled.   Expectant management and discussion of plan and treatment with patient with opportunity to ask questions and all  were answered. The patient agreed with the plan and demonstrated an understanding of the instructions.   The patient was advised to call back or seek an in-person evaluation if worsening  or having concerns .  In the interim.  Shanon Ace, MD

## 2018-08-13 ENCOUNTER — Ambulatory Visit
Admission: RE | Admit: 2018-08-13 | Discharge: 2018-08-13 | Disposition: A | Discharge status: Home / Self Care | Payer: Managed Care, Other (non HMO) | Source: Ambulatory Visit | Attending: Internal Medicine | Admitting: Internal Medicine

## 2018-08-13 ENCOUNTER — Other Ambulatory Visit: Payer: Self-pay | Admitting: Internal Medicine

## 2018-08-13 ENCOUNTER — Other Ambulatory Visit: Payer: Self-pay

## 2018-08-13 DIAGNOSIS — R922 Inconclusive mammogram: Secondary | ICD-10-CM

## 2018-08-13 DIAGNOSIS — N6459 Other signs and symptoms in breast: Secondary | ICD-10-CM

## 2018-08-13 DIAGNOSIS — N6310 Unspecified lump in the right breast, unspecified quadrant: Secondary | ICD-10-CM

## 2018-08-14 ENCOUNTER — Other Ambulatory Visit: Payer: Self-pay | Admitting: General Surgery

## 2018-08-14 DIAGNOSIS — C50919 Malignant neoplasm of unspecified site of unspecified female breast: Secondary | ICD-10-CM

## 2018-08-16 ENCOUNTER — Other Ambulatory Visit: Payer: Managed Care, Other (non HMO)

## 2018-08-19 ENCOUNTER — Telehealth: Payer: Self-pay | Admitting: Hematology and Oncology

## 2018-08-19 ENCOUNTER — Encounter: Payer: Self-pay | Admitting: Hematology and Oncology

## 2018-08-19 ENCOUNTER — Other Ambulatory Visit: Payer: Self-pay

## 2018-08-19 ENCOUNTER — Encounter: Payer: Self-pay | Admitting: *Deleted

## 2018-08-19 ENCOUNTER — Inpatient Hospital Stay: Payer: Managed Care, Other (non HMO) | Attending: Hematology and Oncology | Admitting: Hematology and Oncology

## 2018-08-19 ENCOUNTER — Encounter: Payer: Self-pay | Admitting: Genetic Counselor

## 2018-08-19 ENCOUNTER — Inpatient Hospital Stay: Payer: Managed Care, Other (non HMO)

## 2018-08-19 ENCOUNTER — Other Ambulatory Visit: Payer: Self-pay | Admitting: *Deleted

## 2018-08-19 ENCOUNTER — Inpatient Hospital Stay: Payer: Managed Care, Other (non HMO) | Admitting: Genetic Counselor

## 2018-08-19 ENCOUNTER — Ambulatory Visit (HOSPITAL_BASED_OUTPATIENT_CLINIC_OR_DEPARTMENT_OTHER): Payer: Managed Care, Other (non HMO) | Admitting: Genetic Counselor

## 2018-08-19 DIAGNOSIS — E119 Type 2 diabetes mellitus without complications: Secondary | ICD-10-CM | POA: Insufficient documentation

## 2018-08-19 DIAGNOSIS — C50211 Malignant neoplasm of upper-inner quadrant of right female breast: Secondary | ICD-10-CM

## 2018-08-19 DIAGNOSIS — Z79899 Other long term (current) drug therapy: Secondary | ICD-10-CM

## 2018-08-19 DIAGNOSIS — Z170 Estrogen receptor positive status [ER+]: Secondary | ICD-10-CM

## 2018-08-19 DIAGNOSIS — Z803 Family history of malignant neoplasm of breast: Secondary | ICD-10-CM

## 2018-08-19 DIAGNOSIS — Z79810 Long term (current) use of selective estrogen receptor modulators (SERMs): Secondary | ICD-10-CM | POA: Insufficient documentation

## 2018-08-19 MED ORDER — TAMOXIFEN CITRATE 20 MG PO TABS: 20.0000 mg | ORAL_TABLET | Freq: Every day | ORAL | 0 refills | Status: DC

## 2018-08-19 NOTE — Progress Notes (Signed)
[facility] NOTE  Patient Care Team: Panosh, Standley Brooking, MD as PCP - General Brien Few, MD as Consulting Physician (Obstetrics and Gynecology) Juanita Craver, MD as Consulting Physician (Gastroenterology)  CHIEF COMPLAINTS/PURPOSE OF CONSULTATION:  Newly diagnosed breast cancer  HISTORY OF PRESENTING ILLNESS:  Megan Armstrong 53 y.o. female is here because of recent diagnosis of right breast cancer.  Patient has always had normal mammograms.  She felt a dimpling in the right armpit area as well as sagging of the right breast much worse than the left.  There was no palpable tumor.  She had exams by several physicians and nothing abnormal was palpated.  Based on these she was recommended to get a mammogram.  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  She was seen by Dr. Donne Hazel for surgical consultation and was referred to [place] to discuss medical oncology treatment. She is originally from [place] and her husband is a Consulting civil engineer for [employer].  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  She has 2 daughters who are in 6 grade and 8 grade.  I reviewed her records extensively and collaborated the history with the patient.  SUMMARY OF ONCOLOGIC HISTORY:   Malignant neoplasm of upper-inner quadrant of right breast in female, estrogen receptor positive ([place])   08/13/2018 Initial Diagnosis    Palpable mass at 1230 to 1 o'clock position right breast with skin indentation and focal dimpling UOQ, ultrasound to masses.  12:30 position: 2 cm mass, at 1 o'clock position: 0.9 cm mass, together 3.4 cm.  UOQ: 2 additional masses 0.7 cm and 0.7 cm, indeterminate retroareolar calcifications.    08/13/2018 Pathology Results    Right breast needle core biopsy 1230: Grade 2 ILC with LCIS, UOQ 10:30 position: Grade 2 ILC  with LCIS, ER 90%, PR 30%, Ki-67 10%, HER-2 -1+ by IHC.  T1c N0 stage Ia clinical stage      MEDICAL HISTORY:  Past Medical History:  Diagnosis Date  . Allergic rhinitis   . DM mellitus, gestational   . Endometriosis   . Family history of breast cancer   . Hx of endometriosis    rectal vag with laser surgery   . Seasonal allergies   . TB skin/subcutaneous    postive tb skin test    SURGICAL HISTORY: Past Surgical History:  Procedure Laterality Date  . TONSILLECTOMY      SOCIAL HISTORY: Social History   Socioeconomic History  . Marital status: Married    Spouse name: Not on file  . Number of children: Not on file  . Years of education: Not on file  . Highest education level: Not on file  Occupational History  . Not on file  Social Needs  . Financial resource strain: Not on file  . Food insecurity:    Worry: Not on file    Inability: Not on file  . Transportation needs:    Medical: Not on file    Non-medical: Not on file  Tobacco Use  . Smoking status: Never Smoker  . Smokeless tobacco: Never Used  Substance and Sexual Activity  . Alcohol use: No  . Drug use: No  . Sexual activity: Not on file  Lifestyle  . Physical activity:    Days per week: Not on file    Minutes per session:  Not on file  . Stress: Not on file  Relationships  . Social connections:    Talks on phone: Not on file    Gets together: Not on file    Attends religious service: Not on file    Active member of club or organization: Not on file    Attends meetings of clubs or organizations: Not on file    Relationship status: Not on file  . Intimate partner violence:    Fear of current or ex partner: Not on file    Emotionally abused: Not on file    Physically abused: Not on file    Forced sexual activity: Not on file  Other Topics Concern  . Not on file  Social History Narrative   Borderline spirometry   Married   Regular exercise   decff tea, some coke   2 children at home hh of 4     Pharmacy school internship   Family form [place]    FAMILY HISTORY: Family History  Problem Relation Age of Onset  . Migraines Sister   . Asthma Other   . Diabetes Other   . Hypertension Other   . Sudden death Other     ALLERGIES:  is allergic to acetaminophen; codeine; penicillins; clindamycin/lincomycin; and keflex [cephalexin].  MEDICATIONS:  Current Outpatient Medications  Medication Sig Dispense Refill  . loratadine (CLARITIN REDITABS) 10 MG dissolvable tablet Take 10 mg by mouth daily.      . mometasone (NASONEX) 50 MCG/ACT nasal spray Place 2 sprays into the nose daily. Each nostril 17 g 11  . Multiple Vitamins-Minerals (CENTRUM ULTRA WOMENS PO) Take by mouth.    . oseltamivir (TAMIFLU) 75 MG capsule Take 1 capsule (75 mg total) by mouth 2 (two) times daily. 10 capsule 0  . tamoxifen (NOLVADEX) 20 MG tablet Take 1 tablet (20 mg total) by mouth daily. 30 tablet 0   No current facility-administered medications for this visit.     REVIEW OF SYSTEMS:   Constitutional: Denies fevers, chills or abnormal night sweats Eyes: Denies blurriness of vision, double vision or watery eyes Ears, nose, mouth, throat, and face: Denies mucositis or sore throat Respiratory: Denies cough, dyspnea or wheezes Cardiovascular: Denies palpitation, chest discomfort or lower extremity swelling Gastrointestinal:  Denies nausea, heartburn or change in bowel habits Skin: Denies abnormal skin rashes Lymphatics: Denies new lymphadenopathy or easy bruising Neurological:Denies numbness, tingling or new weaknesses Behavioral/Psych: Mood is stable, no new changes  Breast: Dimpling of the skin of the breast denies any palpable lumps or discharge All other systems were reviewed with the patient and are negative.  PHYSICAL EXAMINATION: ECOG PERFORMANCE STATUS: 1 - Symptomatic but completely ambulatory  Vitals:   08/19/18 1231  BP: 128/68  Pulse: 79  Resp: 18  Temp: 98.2 F (36.8 C)  SpO2:  100%   Filed Weights   08/19/18 1231  Weight: 116 lb 4.8 oz (52.8 kg)    GENERAL:alert, no distress and comfortable SKIN: skin color, texture, turgor are normal, no rashes or significant lesions EYES: normal, conjunctiva are pink and non-injected, sclera clear OROPHARYNX:no exudate, no erythema and lips, buccal mucosa, and tongue normal  NECK: supple, thyroid normal size, non-tender, without nodularity LYMPH:  no palpable lymphadenopathy in the cervical, axillary or inguinal LUNGS: clear to auscultation and percussion with normal breathing effort HEART: regular rate & rhythm and no murmurs and no lower extremity edema ABDOMEN:abdomen soft, non-tender and normal bowel sounds Musculoskeletal:no cyanosis of digits and no clubbing  PSYCH:  alert & oriented x 3 with fluent speech NEURO: no focal motor/sensory deficits   LABORATORY DATA:  I have reviewed the data as listed Lab Results  Component Value Date   WBC 7.0 04/08/2018   HGB 13.2 04/08/2018   HCT 39.7 04/08/2018   MCV 86.0 04/08/2018   PLT 261.0 04/08/2018   Lab Results  Component Value Date   NA 135 04/08/2018   K 4.1 04/08/2018   CL 103 04/08/2018   CO2 26 04/08/2018    RADIOGRAPHIC STUDIES: I have personally reviewed the radiological reports and agreed with the findings in the report.  ASSESSMENT AND PLAN:  Malignant neoplasm of upper-inner quadrant of right breast in female, estrogen receptor positive ([place]) 08/13/2018: Palpable mass at 1230 to 1 o'clock position right breast with skin indentation and focal dimpling UOQ, ultrasound to masses.  12:30 position: 2 cm mass, at 1 o'clock position: 0.9 cm mass, together 3.4 cm.  UOQ: 2 additional masses 0.7 cm and 0.7 cm, indeterminate retroareolar calcifications.  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  T1c N0 stage Ia clinical stage  Pathology and radiology  counseling:Discussed with the patient, the details of pathology including the type of breast cancer,the clinical staging, the significance of ER, PR and HER-2/neu receptors and the implications for treatment. After reviewing the pathology in detail, we proceeded to discuss the different treatment options between surgery, radiation, chemotherapy, antiestrogen therapies.  Recommendations: 1.  Right mastectomy with sentinel lymph node biopsy followed by 2. Oncotype DX testing/MammaPrint to determine if chemotherapy would be of any benefit followed by 3. Adjuvant radiation therapy depending on the final size and lymph node status 4. Adjuvant antiestrogen therapy with tamoxifen since she is still premenopausal  Oncotype counseling: I discussed Oncotype DX test. I explained to the patient that this is a 21 gene panel to evaluate patient tumors DNA to calculate recurrence score. This would help determine whether patient has high risk or intermediate risk or low risk breast cancer. She understands that if her tumor was found to be high risk, she would benefit from systemic chemotherapy. If low risk, no need of chemotherapy. If she was found to be intermediate risk, we would need to evaluate the score as well as other risk factors and determine if an abbreviated chemotherapy may be of benefit.  Genetic testing will be done today. If her surgery gets delayed for planning purposes I recommended starting her on tamoxifen 20 mg daily.  She knows to stop tamoxifen 1 week prior to surgery. I discussed risks and benefits of tamoxifen and she appears to understand them.  She is concerned about endometrial hypertrophy which she has had in the past and required progesterone replacement.   Return to clinic after surgery to discuss final pathology report and then determine if Oncotype DX testing will need to be sent.  We can do this appointment through a WebEx video visit     All questions were answered. The patient  knows to call the clinic with any problems, questions or concerns.    Harriette Ohara, MD 08/19/18

## 2018-08-19 NOTE — Progress Notes (Signed)
REFERRING PROVIDER: Nicholas Lose, MD [place],  [id number]  PRIMARY PROVIDER:  Burnis Medin, MD  PRIMARY REASON FOR VISIT:  1. Malignant neoplasm of upper-inner quadrant of right breast in female, estrogen receptor positive ([place])   2. Family history of breast cancer      HISTORY OF PRESENT ILLNESS:   Ms. Reposa, a 53 y.o. female, was seen for a [facility] cancer genetics consultation at the request of Dr. Lindi Adie due to a personal and family history of breast cancer.  Ms. Moroni presents to clinic today to discuss the possibility of a hereditary predisposition to cancer, genetic testing, and to further clarify her future cancer risks, as well as potential cancer risks for family members.   In April 2020, at the age of 19, Ms. Kinder was diagnosed with invasive ductal carcinoma of the right breast. The treatment plan includes unilateral mastectomy, possibly chemotherapy and radiation, and antiestrogen therapy.      CANCER HISTORY:    Malignant neoplasm of upper-inner quadrant of right breast in female, estrogen receptor positive ([place])   08/13/2018 Initial Diagnosis    Palpable mass at 1230 to 1 o'clock position right breast with skin indentation and focal dimpling UOQ, ultrasound to masses.  12:30 position: 2 cm mass, at 1 o'clock position: 0.9 cm mass, together 3.4 cm.  UOQ: 2 additional masses 0.7 cm and 0.7 cm, indeterminate retroareolar calcifications.    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  T1c N0 stage Ia clinical stage      RISK FACTORS:  Menarche was at age 23.  First live birth at age 82.  OCP use for approximately 1-3 years.  Ovaries intact: yes.  Hysterectomy: no.  Menopausal status: premenopausal.  HRT use: 0 years. Colonoscopy: yes; a few polyps. Mammogram within the last year: yes. Number  of breast biopsies: 1. Up to date with pelvic exams: yes. Any excessive radiation exposure in the past: no  Past Medical History:  Diagnosis Date  . Allergic rhinitis   . DM mellitus, gestational   . Endometriosis   . Family history of breast cancer   . Family history of breast cancer   . Hx of endometriosis    rectal vag with laser surgery   . Seasonal allergies   . TB skin/subcutaneous    postive tb skin test    Past Surgical History:  Procedure Laterality Date  . TONSILLECTOMY      Social History   Socioeconomic History  . Marital status: Married    Spouse name: Not on file  . Number of children: Not on file  . Years of education: Not on file  . Highest education level: Not on file  Occupational History  . Not on file  Social Needs  . Financial resource strain: Not on file  . Food insecurity:    Worry: Not on file    Inability: Not on file  . Transportation needs:    Medical: Not on file    Non-medical: Not on file  Tobacco Use  . Smoking status: Never Smoker  . Smokeless tobacco: Never Used  Substance and Sexual Activity  . Alcohol use: No  . Drug use: No  . Sexual activity: Not on file  Lifestyle  . Physical activity:    Days per week: Not on file  Minutes per session: Not on file  . Stress: Not on file  Relationships  . Social connections:    Talks on phone: Not on file    Gets together: Not on file    Attends religious service: Not on file    Active member of club or organization: Not on file    Attends meetings of clubs or organizations: Not on file    Relationship status: Not on file  Other Topics Concern  . 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  Significant diagnoses are listed below: Family History   Problem Relation Age of Onset  . Migraines Sister   . Asthma Other   . Diabetes Other   . Hypertension Other   . Sudden death Other   . Breast cancer Cousin 74       mat first cousin, BRCA pos    The patient has two daughters who are cancer free.  She has two sisters who are cancer free.  Her parents are both deceased.    The patient's mother died at 41 form an asthma attack.  She had three sisters and five brothers who were cancer free.  One brother has a daughter who had breast cancer at 26 and is reportedly BRCA positive.  The maternal grandparents are deceased.  The patient's father died at 64.  He has four brothers and four sisters who are cancer free.  Both paternal grandparents are deceased.  Ms. Rubis is aware of previous family history of genetic testing for hereditary cancer risks. Patient's maternal ancestors are of [place] descent, and paternal ancestors are of [place] descent. There is no reported Ashkenazi Jewish ancestry. There is no known consanguinity.  GENETIC COUNSELING ASSESSMENT: Ms. Garland is a 53 y.o. female with a personal and family history of breast cancer which is somewhat suggestive of a hereditary cancer syndrome and predisposition to cancer. We, therefore, discussed and recommended the following at today's visit.   DISCUSSION: We discussed that 5 - 10% of breast cancer is hereditary, with most cases associated with BRCA.  There are other genes that can be associated with hereditary breast cancer syndromes.  These include ATM CHEK2, and PALB2.  We discussed that if her cousin has a BRCA mutation, like she understands, that the patient has a 12% chance of also having a BRCA mutation.  However, her family history is not consistent with a typical BRCA family.  There is a lack of cancer in the family, suggesting that either her cousin's BRCA mutation was not inherited from her side of the family, that the cousin has a BRCA VUS, and not a deleterious  mutation, or that the BRCA mutation in the family is a low penetrant mutation.  We would like to obtain a copy of the test results if possible.   We discussed that testing is beneficial for several reasons including knowing how to follow individuals after completing their treatment, identifying whether potential treatment options such as PARP inhibitors would be beneficial, and understand if other family members could be at risk for cancer and allow them to undergo genetic testing.   We reviewed the characteristics, features and inheritance patterns of hereditary cancer syndromes. We  also discussed genetic testing, including the appropriate family members to test, the process of testing, insurance coverage and turn-around-time for results. We discussed the implications of a negative, positive and/or variant of uncertain significant result. In order to get genetic test results in a timely manner so that Ms. Amsler can use these genetic test results for surgical decisions, we recommended Ms. Crite pursue genetic testing for the BRCAPlus. Once complete, we recommend Ms. Rahmani pursue reflex genetic testing to the CustomNext-cancer+RNAinsight gene panel. The CustomNext-Expanded gene panel offered by [facility] and includes sequencing and rearrangement analysis for the following 81 genes: AIP, ALK, APC*, ATM*, AXIN2, BAP1, BARD1, BLM, BMPR1A, BRCA1*, BRCA2*, BRIP1*, CDC73, CDH1*, CDK4, CDKN1B, CDKN2A, CHEK2*, CTNNA1, DICER1, FANCC, FH, FLCN, GALNT12, HOXB13, KIT, MAX, MEN1, MET, MLH1*, MRE11A, MSH2*, MSH6*, MUTYH*, NBN, NF1*, NF2, NTHL1, PALB2*, PDGFRA, PHOX2B, PMS2*, POLD1, POLE, POT1, PRKAR1A, PTCH1, PTEN*, RAD50, RAD51C*, RAD51D*, RB1, RET, SDHA, SDHAF2, SDHB, SDHC, SDHD, SMAD4, SMARCA4, SMARCB1, SMARCE1, STK11, SUFU, TMEM127, TP53*, TSC1, TSC2, VHL and XRCC2 (sequencing and deletion/duplication); CASR, CFTR, CPA1, CTRC, EGFR, MITF, PRSS1 and SPINK1 (sequencing only); EPCAM and GREM1 (deletion/duplication  only). DNA and RNA analyses performed for * genes.   Based on Ms. Kissel's personal and family history of cancer, she meets medical criteria for genetic testing. Despite that she meets criteria, she may still have an out of pocket cost.   PLAN: After considering the risks, benefits, and limitations, Ms. Kloth provided informed consent to pursue genetic testing and the blood sample was sent to [facility] for analysis of the BRCAPlus and CustomNext-Cancer +RNA insight. Results should be available within approximately 2-3 weeks' time, at which point they will be disclosed by telephone to Ms. Pelaez, as will any additional recommendations warranted by these results. Ms. Matteo will receive a summary of her genetic counseling visit and a copy of her results once available. This information will also be available in Epic.   Lastly, we encouraged Ms. Bergen to remain in contact with cancer genetics annually so that we can continuously update the family history and inform her of any changes in cancer genetics and testing that may be of benefit for this family.   Ms. Carrero questions were answered to her satisfaction today. Our contact information was provided should additional questions or concerns arise. Thank you for the referral and allowing [place] to share in the care of your patient.   Jahziel Sinn P. Florene Glen, [place], [facility] Certified Genetic Counselor [email or web address] phone: [phone]  The patient was seen for a total of 47 minutes in face-to-face genetic counseling.  This patient was discussed with Drs. Magrinat, Lindi Adie and/or Burr Medico who agrees with the above.    _______________________________________________________________________ For Office Staff:  Number of people involved in session: 1 Was an Intern/ student involved with case: no

## 2018-08-19 NOTE — Telephone Encounter (Signed)
Received a staff msg from Dr. Lindi Adie for the pt to see him today at 5/11 at 12pm.

## 2018-08-19 NOTE — Assessment & Plan Note (Addendum)
08/13/2018: Palpable mass at 1230 to 1 o'clock position right breast with skin indentation and focal dimpling UOQ, ultrasound to masses.  12:30 position: 2 cm mass, at 1 o'clock position: 0.9 cm mass, together 3.4 cm.  UOQ: 2 additional masses 0.7 cm and 0.7 cm, indeterminate retroareolar calcifications.  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  T1c N0 stage Ia clinical stage  Pathology and radiology counseling:Discussed with the patient, the details of pathology including the type of breast cancer,the clinical staging, the significance of ER, PR and HER-2/neu receptors and the implications for treatment. After reviewing the pathology in detail, we proceeded to discuss the different treatment options between surgery, radiation, chemotherapy, antiestrogen therapies.  Recommendations: 1.  Right mastectomy with sentinel lymph node biopsy followed by 2. Oncotype DX testing/MammaPrint to determine if chemotherapy would be of any benefit followed by 3. Adjuvant radiation therapy depending on the final size and lymph node status 4. Adjuvant antiestrogen therapy with tamoxifen since she is still premenopausal  Oncotype counseling: I discussed Oncotype DX test. I explained to the patient that this is a 21 gene panel to evaluate patient tumors DNA to calculate recurrence score. This would help determine whether patient has high risk or intermediate risk or low risk breast cancer. She understands that if her tumor was found to be high risk, she would benefit from systemic chemotherapy. If low risk, no need of chemotherapy. If she was found to be intermediate risk, we would need to evaluate the score as well as other risk factors and determine if an abbreviated chemotherapy may be of benefit.  Genetic testing will be done today. If her surgery gets delayed for planning purposes I recommended starting her on  tamoxifen 20 mg daily.  She knows to stop tamoxifen 1 week prior to surgery. I discussed risks and benefits of tamoxifen and she appears to understand them.  She is concerned about endometrial hypertrophy which she has had in the past and required progesterone replacement.   Return to clinic after surgery to discuss final pathology report and then determine if Oncotype DX testing will need to be sent.  We can do this appointment through a WebEx video visit

## 2018-08-20 ENCOUNTER — Telehealth: Payer: Self-pay | Admitting: *Deleted

## 2018-08-20 NOTE — Telephone Encounter (Signed)
Called pt to provide navigation resources and to give contact information. Denies questions or concerns regarding dx or treatment care plan. Confirmed future appts. Encourage pt to call with needs. Received verbal understanding.

## 2018-08-21 ENCOUNTER — Ambulatory Visit
Admission: RE | Admit: 2018-08-21 | Discharge: 2018-08-21 | Disposition: A | Discharge status: Home / Self Care | Payer: Managed Care, Other (non HMO) | Source: Ambulatory Visit | Attending: General Surgery | Admitting: General Surgery

## 2018-08-21 ENCOUNTER — Other Ambulatory Visit: Payer: Self-pay

## 2018-08-21 DIAGNOSIS — C50919 Malignant neoplasm of unspecified site of unspecified female breast: Secondary | ICD-10-CM

## 2018-08-21 MED ORDER — GADOBUTROL 1 MMOL/ML IV SOLN
6.0000 mL | Freq: Once | INTRAVENOUS | Status: AC | PRN
  Administered 2018-08-21: 6 mL via INTRAVENOUS

## 2018-08-22 ENCOUNTER — Encounter: Payer: Self-pay | Admitting: *Deleted

## 2018-08-22 ENCOUNTER — Other Ambulatory Visit: Payer: Self-pay | Admitting: General Surgery

## 2018-08-22 DIAGNOSIS — C50111 Malignant neoplasm of central portion of right female breast: Secondary | ICD-10-CM

## 2018-08-27 ENCOUNTER — Telehealth: Payer: Self-pay | Admitting: Genetic Counselor

## 2018-08-27 NOTE — Telephone Encounter (Signed)
Revealed negative genetic testing on her STAT panel.  Discussed that initial testing is negative, we are still waiting on the remainder of the panel testing.  This current testing looked at high risk genes that can influence surgical decisions, as well as addresses the concern about her cousin's potential BRCA pos test.  We recommend that she continue to try to obtain a copy of her cousin's testing, as this could still influence her sister's risks. We will call her when the remainder of her testing is completed, and will at that time send her a hard copy of the results.

## 2018-08-29 ENCOUNTER — Telehealth: Payer: Self-pay | Admitting: Hematology and Oncology

## 2018-08-29 NOTE — Telephone Encounter (Signed)
Scheduled appt per sch msg. Called and spoke with patient. Confirmed date and time °

## 2018-09-09 ENCOUNTER — Encounter (HOSPITAL_BASED_OUTPATIENT_CLINIC_OR_DEPARTMENT_OTHER): Payer: Self-pay | Admitting: *Deleted

## 2018-09-09 ENCOUNTER — Telehealth: Payer: Self-pay | Admitting: Genetic Counselor

## 2018-09-09 ENCOUNTER — Encounter: Payer: Self-pay | Admitting: Genetic Counselor

## 2018-09-09 ENCOUNTER — Other Ambulatory Visit: Payer: Self-pay

## 2018-09-09 ENCOUNTER — Ambulatory Visit: Payer: Self-pay | Admitting: Genetic Counselor

## 2018-09-09 DIAGNOSIS — Z1379 Encounter for other screening for genetic and chromosomal anomalies: Secondary | ICD-10-CM | POA: Insufficient documentation

## 2018-09-09 NOTE — Telephone Encounter (Signed)
Revealed negative genetic testing.  Discussed that we do not know why she has breast cancer or why there is cancer in the family. It could be due to a different gene that we are not testing, or maybe our current technology may not be able to pick something up.  It will be important for her to keep in contact with genetics to keep up with whether additional testing may be needed.   A VUS in HOXB13 was identified.  This is still considered a normal test.

## 2018-09-09 NOTE — Progress Notes (Signed)
HPI:  Megan Armstrong was previously seen in the [facility] due to a personal and family history of breast cancer and concerns regarding a hereditary predisposition to cancer. Please refer to our prior cancer genetics clinic note for more information regarding our discussion, assessment and recommendations, at the time. Megan Armstrong recent genetic test results were disclosed to her, as were recommendations warranted by these results. These results and recommendations are discussed in more detail below.  CANCER HISTORY:    Malignant neoplasm of upper-inner quadrant of right breast in female, estrogen receptor positive ([place])   08/13/2018 Initial Diagnosis    Palpable mass at 1230 to 1 o'clock position right breast with skin indentation and focal dimpling UOQ, ultrasound to masses.  12:30 position: 2 cm mass, at 1 o'clock position: 0.9 cm mass, together 3.4 cm.  UOQ: 2 additional masses 0.7 cm and 0.7 cm, indeterminate retroareolar calcifications.    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  T1c N0 stage Ia clinical stage    09/06/2018 Genetic Testing    Negative genetic testing on the CustomNext-Cancer+RNAinsight testing. 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  The CustomNext-Expanded gene panel offered by [facility] and includes sequencing and rearrangement analysis for the following 81 genes: AIP, ALK, APC*, ATM*, AXIN2, BAP1, BARD1, BLM, BMPR1A, BRCA1*, BRCA2*, BRIP1*, CDC73, CDH1*, CDK4, CDKN1B, CDKN2A, CHEK2*, CTNNA1, DICER1, FANCC, FH, FLCN, GALNT12, HOXB13, KIT, MAX, MEN1, MET, MLH1*, MRE11A, MSH2*, MSH6*, MUTYH*, NBN, NF1*, NF2, NTHL1, PALB2*, PDGFRA, PHOX2B, PMS2*, POLD1, POLE, POT1, PRKAR1A, PTCH1, PTEN*, RAD50, RAD51C*, RAD51D*, RB1, RET, SDHA, SDHAF2, SDHB, SDHC, SDHD, SMAD4, SMARCA4, SMARCB1, SMARCE1, STK11, SUFU, TMEM127, TP53*,  TSC1, TSC2, VHL and XRCC2 (sequencing and deletion/duplication); CASR, CFTR, CPA1, CTRC, EGFR, MITF, PRSS1 and SPINK1 (sequencing only); EPCAM and GREM1 (deletion/duplication only). DNA and RNA analyses performed for * genes.  The report date is Sep 06, 2018.     FAMILY HISTORY:  We obtained a detailed, 4-generation family history.  Significant diagnoses are listed below: Family History  Problem Relation Age of Onset   Migraines Sister    Asthma Other    Diabetes Other    Hypertension Other    Sudden death Other    Breast cancer Cousin 22       mat first cousin, BRCA pos    The patient has two daughters who are cancer free.  She has two sisters who are cancer free.  Her parents are both deceased.    The patient's mother died at 7 form an asthma attack.  She had three sisters and five brothers who were cancer free.  One brother has a daughter who had breast cancer at 34 and is reportedly BRCA positive.  The maternal grandparents are deceased.  The patient's father died at 65.  He has four brothers and four sisters who are cancer free.  Both paternal grandparents are deceased.  Megan Armstrong is aware of previous family history of genetic testing for hereditary cancer risks. Patient's maternal ancestors are of [place] descent, and paternal ancestors are of [place] descent. There is no reported Ashkenazi Jewish ancestry. There is no known consanguinity.    GENETIC TEST RESULTS: Genetic testing reported out on Sep 06, 2018 through the CustomNext-Cancer+RNAinsight cancer panel found no pathogenic mutations. The CustomNext-Expanded gene panel offered by [facility] and includes  sequencing and rearrangement analysis for the following 81 genes: AIP, ALK, APC*, ATM*, AXIN2, BAP1, BARD1, BLM, BMPR1A, BRCA1*, BRCA2*, BRIP1*, CDC73, CDH1*, CDK4, CDKN1B, CDKN2A, CHEK2*, CTNNA1, DICER1, FANCC, FH, FLCN, GALNT12, HOXB13, KIT, MAX, MEN1, MET, MLH1*, MRE11A, MSH2*, MSH6*, MUTYH*,  NBN, NF1*, NF2, NTHL1, PALB2*, PDGFRA, PHOX2B, PMS2*, POLD1, POLE, POT1, PRKAR1A, PTCH1, PTEN*, RAD50, RAD51C*, RAD51D*, RB1, RET, SDHA, SDHAF2, SDHB, SDHC, SDHD, SMAD4, SMARCA4, SMARCB1, SMARCE1, STK11, SUFU, TMEM127, TP53*, TSC1, TSC2, VHL and XRCC2 (sequencing and deletion/duplication); CASR, CFTR, CPA1, CTRC, EGFR, MITF, PRSS1 and SPINK1 (sequencing only); EPCAM and GREM1 (deletion/duplication only). DNA and RNA analyses performed for * genes. The test report has been scanned into EPIC and is located under the Molecular Pathology section of the Results Review tab.  A portion of the result report is included below for reference.    We discussed with Megan Armstrong that because current genetic testing is not perfect, it is possible there may be a gene mutation in one of these genes that current testing cannot detect, but that chance is small.  We also discussed, that there could be another gene that has not yet been discovered, or that we have not yet tested, that is responsible for the cancer diagnoses in the family. It is also possible there is a hereditary cause for the cancer in the family that Megan Armstrong did not inherit and therefore was not identified in her testing.  Therefore, it is important to remain in touch with cancer genetics in the future so that we can continue to offer Megan Armstrong the most up to date genetic testing.   Genetic testing did identify a variant of uncertain significance (VUS) was identified in the HOXB13 gene called p.G22R.  At this time, it is unknown if this variant is associated with increased cancer risk or if this is a normal finding, but most variants such as this get reclassified to being inconsequential. It should not be used to make medical management decisions. With time, we suspect the lab will determine the significance of this variant, if any. If we do learn more about it, we will try to contact Megan Armstrong to discuss it further. However, it is important to stay in touch  with [place] periodically and keep the address and phone number up to date.  ADDITIONAL GENETIC TESTING: We discussed with Megan Armstrong that there are other genes that are associated with increased cancer risk that can be analyzed. Should Megan Armstrong wish to pursue additional genetic testing, we are happy to discuss and coordinate this testing, at any time.    CANCER SCREENING RECOMMENDATIONS: Megan Armstrong test result is considered negative (normal).  This means that we have not identified a hereditary cause for her personal and family history of cancer at this time. Most cancers happen by chance and this negative test suggests that her cancer may fall into this category.    While reassuring, this does not definitively rule out a hereditary predisposition to cancer. It is still possible that there could be genetic mutations that are undetectable by current technology. There could be genetic mutations in genes that have not been tested or identified to increase cancer risk.  Therefore, it is recommended she continue to follow the cancer management and screening guidelines provided by her oncology and primary healthcare provider.   An individual's cancer risk and medical management are not determined by genetic test results alone. Overall cancer risk assessment incorporates additional factors, including personal medical history, family history, and any available genetic  information that may result in a personalized plan for cancer prevention and surveillance  RECOMMENDATIONS FOR FAMILY MEMBERS:  Individuals in this family might be at some increased risk of developing cancer, over the general population risk, simply due to the family history of cancer.  We recommended women in this family have a yearly mammogram beginning at age 43, or 53 years younger than the earliest onset of cancer, an annual clinical breast exam, and perform monthly breast self-exams. Women in this family should also have a gynecological exam as  recommended by their primary provider. All family members should have a colonoscopy by age 62.  FOLLOW-UP: Lastly, we discussed with Megan Armstrong that cancer genetics is a rapidly advancing field and it is possible that new genetic tests will be appropriate for her and/or her family members in the future. We encouraged her to remain in contact with cancer genetics on an annual basis so we can update her personal and family histories and let her know of advances in cancer genetics that may benefit this family.   Our contact number was provided. Megan Armstrong questions were answered to her satisfaction, and she knows she is welcome to call us at anytime with additional questions or concerns.   Roma Kayser, MS, [facility] Certified Genetic Counselor [email or web address]

## 2018-09-12 ENCOUNTER — Other Ambulatory Visit (HOSPITAL_COMMUNITY)
Admission: RE | Admit: 2018-09-12 | Discharge: 2018-09-12 | Disposition: A | Discharge status: Home / Self Care | Payer: Managed Care, Other (non HMO) | Source: Ambulatory Visit | Attending: General Surgery | Admitting: General Surgery

## 2018-09-12 DIAGNOSIS — Z1159 Encounter for screening for other viral diseases: Secondary | ICD-10-CM | POA: Insufficient documentation

## 2018-09-12 NOTE — Progress Notes (Signed)
Ensure pre surgery drink given with instructions to complete by [facility], surgical soap given with instructions, pt verbalized understanding.

## 2018-09-13 LAB — NOVEL CORONAVIRUS, NAA (HOSP ORDER, SEND-OUT TO REF LAB; TAT 18-24 HRS): SARS-CoV-2, NAA: NOT DETECTED

## 2018-09-15 ENCOUNTER — Other Ambulatory Visit: Payer: Self-pay | Admitting: Hematology and Oncology

## 2018-09-16 ENCOUNTER — Encounter (HOSPITAL_COMMUNITY): Payer: Managed Care, Other (non HMO)

## 2018-09-16 ENCOUNTER — Encounter (HOSPITAL_BASED_OUTPATIENT_CLINIC_OR_DEPARTMENT_OTHER): Payer: Self-pay | Admitting: *Deleted

## 2018-09-16 ENCOUNTER — Ambulatory Visit (HOSPITAL_COMMUNITY)
Admission: RE | Admit: 2018-09-16 | Discharge: 2018-09-16 | Disposition: A | Discharge status: Home / Self Care | Payer: Managed Care, Other (non HMO) | Source: Ambulatory Visit | Attending: General Surgery | Admitting: General Surgery

## 2018-09-16 ENCOUNTER — Ambulatory Visit (HOSPITAL_COMMUNITY): Payer: Managed Care, Other (non HMO)

## 2018-09-16 ENCOUNTER — Ambulatory Visit (HOSPITAL_BASED_OUTPATIENT_CLINIC_OR_DEPARTMENT_OTHER): Payer: Managed Care, Other (non HMO) | Admitting: Anesthesiology

## 2018-09-16 ENCOUNTER — Encounter (HOSPITAL_BASED_OUTPATIENT_CLINIC_OR_DEPARTMENT_OTHER)
Admission: RE | Disposition: A | Discharge status: Home / Self Care | Payer: Self-pay | Source: Home / Self Care | Attending: General Surgery

## 2018-09-16 ENCOUNTER — Other Ambulatory Visit: Payer: Self-pay

## 2018-09-16 ENCOUNTER — Ambulatory Visit (HOSPITAL_BASED_OUTPATIENT_CLINIC_OR_DEPARTMENT_OTHER)
Admission: RE | Admit: 2018-09-16 | Discharge: 2018-09-17 | Disposition: A | Discharge status: Home / Self Care | Payer: Managed Care, Other (non HMO) | Attending: General Surgery | Admitting: General Surgery

## 2018-09-16 DIAGNOSIS — Z885 Allergy status to narcotic agent status: Secondary | ICD-10-CM | POA: Insufficient documentation

## 2018-09-16 DIAGNOSIS — Z170 Estrogen receptor positive status [ER+]: Secondary | ICD-10-CM | POA: Diagnosis not present

## 2018-09-16 DIAGNOSIS — D4989 Neoplasm of unspecified behavior of other specified sites: Secondary | ICD-10-CM | POA: Insufficient documentation

## 2018-09-16 DIAGNOSIS — C50911 Malignant neoplasm of unspecified site of right female breast: Secondary | ICD-10-CM | POA: Diagnosis present

## 2018-09-16 DIAGNOSIS — Z880 Allergy status to penicillin: Secondary | ICD-10-CM | POA: Diagnosis not present

## 2018-09-16 DIAGNOSIS — C50211 Malignant neoplasm of upper-inner quadrant of right female breast: Secondary | ICD-10-CM | POA: Diagnosis not present

## 2018-09-16 DIAGNOSIS — Z881 Allergy status to other antibiotic agents status: Secondary | ICD-10-CM | POA: Insufficient documentation

## 2018-09-16 DIAGNOSIS — Z803 Family history of malignant neoplasm of breast: Secondary | ICD-10-CM | POA: Diagnosis not present

## 2018-09-16 DIAGNOSIS — C50111 Malignant neoplasm of central portion of right female breast: Secondary | ICD-10-CM

## 2018-09-16 HISTORY — DX: Endometrial hyperplasia, unspecified: N85.00

## 2018-09-16 HISTORY — PX: MASTECTOMY W/ SENTINEL NODE BIOPSY: SHX2001

## 2018-09-16 LAB — POCT PREGNANCY, URINE: Preg Test, Ur: NEGATIVE

## 2018-09-16 SURGERY — MASTECTOMY WITH SENTINEL LYMPH NODE BIOPSY
Anesthesia: General | Site: Breast | Laterality: Bilateral

## 2018-09-16 MED ORDER — OXYCODONE HCL 5 MG PO TABS: 5.0000 mg | ORAL_TABLET | Freq: Four times a day (QID) | ORAL | 0 refills | Status: DC | PRN

## 2018-09-16 MED ORDER — FENTANYL CITRATE (PF) 100 MCG/2ML IJ SOLN
INTRAMUSCULAR | Status: AC
  Filled 2018-09-16: qty 2

## 2018-09-16 MED ORDER — KETOROLAC TROMETHAMINE 15 MG/ML IJ SOLN
15.0000 mg | INTRAMUSCULAR | Status: DC
  Administered 2018-09-16: 15 mg via INTRAVENOUS

## 2018-09-16 MED ORDER — LIDOCAINE HCL (CARDIAC) PF 100 MG/5ML IV SOSY
PREFILLED_SYRINGE | INTRAVENOUS | Status: DC | PRN
  Administered 2018-09-16: 40 mg via INTRAVENOUS

## 2018-09-16 MED ORDER — DEXAMETHASONE SODIUM PHOSPHATE 10 MG/ML IJ SOLN
INTRAMUSCULAR | Status: AC
  Filled 2018-09-16: qty 1

## 2018-09-16 MED ORDER — MORPHINE SULFATE (PF) 4 MG/ML IV SOLN: 2.0000 mg | INTRAVENOUS | Status: DC | PRN

## 2018-09-16 MED ORDER — ACETAMINOPHEN 500 MG PO TABS
1000.0000 mg | ORAL_TABLET | Freq: Four times a day (QID) | ORAL | Status: DC
  Administered 2018-09-16 – 2018-09-17 (×2): 1000 mg via ORAL
  Filled 2018-09-16 (×3): qty 2

## 2018-09-16 MED ORDER — KETOROLAC TROMETHAMINE 15 MG/ML IJ SOLN
INTRAMUSCULAR | Status: AC
  Filled 2018-09-16: qty 1

## 2018-09-16 MED ORDER — OXYCODONE HCL 5 MG PO TABS
5.0000 mg | ORAL_TABLET | ORAL | Status: DC | PRN
  Administered 2018-09-16 – 2018-09-17 (×2): 5 mg via ORAL
  Filled 2018-09-16 (×2): qty 1

## 2018-09-16 MED ORDER — PROCHLORPERAZINE MALEATE 10 MG PO TABS: 10.0000 mg | ORAL_TABLET | Freq: Four times a day (QID) | ORAL | Status: DC | PRN

## 2018-09-16 MED ORDER — LIDOCAINE 2% (20 MG/ML) 5 ML SYRINGE
INTRAMUSCULAR | Status: AC
  Filled 2018-09-16: qty 5

## 2018-09-16 MED ORDER — GABAPENTIN 100 MG PO CAPS
100.0000 mg | ORAL_CAPSULE | ORAL | Status: AC
  Administered 2018-09-16: 11:00:00 100 mg via ORAL

## 2018-09-16 MED ORDER — SODIUM CHLORIDE 0.9 % IV SOLN: INTRAVENOUS | Status: DC

## 2018-09-16 MED ORDER — PROPOFOL 500 MG/50ML IV EMUL
INTRAVENOUS | Status: DC | PRN
  Administered 2018-09-16: 15 ug/kg/min via INTRAVENOUS

## 2018-09-16 MED ORDER — MIDAZOLAM HCL 2 MG/2ML IJ SOLN
1.0000 mg | INTRAMUSCULAR | Status: DC | PRN
  Administered 2018-09-16: 11:00:00 2 mg via INTRAVENOUS

## 2018-09-16 MED ORDER — PROCHLORPERAZINE EDISYLATE 10 MG/2ML IJ SOLN
5.0000 mg | Freq: Four times a day (QID) | INTRAMUSCULAR | Status: DC | PRN
  Administered 2018-09-16: 5 mg via INTRAVENOUS
  Filled 2018-09-16: qty 2

## 2018-09-16 MED ORDER — ENSURE PRE-SURGERY PO LIQD: 296.0000 mL | Freq: Once | ORAL | Status: DC

## 2018-09-16 MED ORDER — METHOCARBAMOL 750 MG PO TABS: 750.0000 mg | ORAL_TABLET | Freq: Three times a day (TID) | ORAL | 1 refills | Status: DC | PRN

## 2018-09-16 MED ORDER — ONDANSETRON 4 MG PO TBDP: 4.0000 mg | ORAL_TABLET | Freq: Four times a day (QID) | ORAL | Status: DC | PRN

## 2018-09-16 MED ORDER — SODIUM CHLORIDE (PF) 0.9 % IJ SOLN
INTRAVENOUS | Status: DC | PRN
  Administered 2018-09-16: 2 mL via INTRADERMAL

## 2018-09-16 MED ORDER — PROPOFOL 10 MG/ML IV BOLUS
INTRAVENOUS | Status: DC | PRN
  Administered 2018-09-16: 150 mg via INTRAVENOUS

## 2018-09-16 MED ORDER — SIMETHICONE 80 MG PO CHEW: 40.0000 mg | CHEWABLE_TABLET | Freq: Four times a day (QID) | ORAL | Status: DC | PRN

## 2018-09-16 MED ORDER — FENTANYL CITRATE (PF) 100 MCG/2ML IJ SOLN
50.0000 ug | INTRAMUSCULAR | Status: DC | PRN
  Administered 2018-09-16: 13:00:00 25 ug via INTRAVENOUS
  Administered 2018-09-16: 11:00:00 100 ug via INTRAVENOUS

## 2018-09-16 MED ORDER — MIDAZOLAM HCL 2 MG/2ML IJ SOLN
INTRAMUSCULAR | Status: AC
  Filled 2018-09-16: qty 2

## 2018-09-16 MED ORDER — TECHNETIUM TC 99M SULFUR COLLOID FILTERED
1.0000 | Freq: Once | INTRAVENOUS | Status: AC | PRN
  Administered 2018-09-16: 12:00:00 1 via INTRADERMAL

## 2018-09-16 MED ORDER — GABAPENTIN 100 MG PO CAPS
ORAL_CAPSULE | ORAL | Status: AC
  Filled 2018-09-16: qty 1

## 2018-09-16 MED ORDER — ROPIVACAINE HCL 5 MG/ML IJ SOLN
INTRAMUSCULAR | Status: DC | PRN
  Administered 2018-09-16: 40 mL via PERINEURAL

## 2018-09-16 MED ORDER — PROPOFOL 500 MG/50ML IV EMUL
INTRAVENOUS | Status: AC
  Filled 2018-09-16: qty 50

## 2018-09-16 MED ORDER — LACTATED RINGERS IV SOLN
INTRAVENOUS | Status: DC
  Administered 2018-09-16 (×2): via INTRAVENOUS

## 2018-09-16 MED ORDER — CIPROFLOXACIN IN D5W 400 MG/200ML IV SOLN
INTRAVENOUS | Status: AC
  Filled 2018-09-16: qty 200

## 2018-09-16 MED ORDER — ONDANSETRON HCL 4 MG/2ML IJ SOLN: 4.0000 mg | Freq: Four times a day (QID) | INTRAMUSCULAR | Status: DC | PRN

## 2018-09-16 MED ORDER — CIPROFLOXACIN IN D5W 400 MG/200ML IV SOLN
400.0000 mg | INTRAVENOUS | Status: AC
  Administered 2018-09-16: 12:00:00 400 mg via INTRAVENOUS

## 2018-09-16 MED ORDER — SCOPOLAMINE 1 MG/3DAYS TD PT72: 1.0000 | MEDICATED_PATCH | Freq: Once | TRANSDERMAL | Status: DC | PRN

## 2018-09-16 MED ORDER — METHOCARBAMOL 500 MG PO TABS
500.0000 mg | ORAL_TABLET | Freq: Four times a day (QID) | ORAL | Status: DC | PRN
  Administered 2018-09-16: 500 mg via ORAL
  Filled 2018-09-16: qty 1

## 2018-09-16 MED ORDER — ONDANSETRON HCL 4 MG/2ML IJ SOLN
INTRAMUSCULAR | Status: DC | PRN
  Administered 2018-09-16: 4 mg via INTRAVENOUS

## 2018-09-16 MED ORDER — ONDANSETRON HCL 4 MG/2ML IJ SOLN
INTRAMUSCULAR | Status: AC
  Filled 2018-09-16: qty 2

## 2018-09-16 MED ORDER — DEXAMETHASONE SODIUM PHOSPHATE 4 MG/ML IJ SOLN
INTRAMUSCULAR | Status: DC | PRN
  Administered 2018-09-16: 10 mg via INTRAVENOUS

## 2018-09-16 SURGICAL SUPPLY — 80 items
ADH SKN CLS APL DERMABOND .7 (GAUZE/BANDAGES/DRESSINGS) ×2
APL PRP STRL LF DISP 70% ISPRP (MISCELLANEOUS) ×1
APL SKNCLS STERI-STRIP NONHPOA (GAUZE/BANDAGES/DRESSINGS)
APPLIER CLIP 11 MED OPEN (CLIP)
APPLIER CLIP 9.375 MED OPEN (MISCELLANEOUS) ×3
APR CLP MED 11 20 MLT OPN (CLIP)
APR CLP MED 9.3 20 MLT OPN (MISCELLANEOUS) ×1
BENZOIN TINCTURE PRP APPL 2/3 (GAUZE/BANDAGES/DRESSINGS) IMPLANT
BINDER BREAST LRG (GAUZE/BANDAGES/DRESSINGS) IMPLANT
BINDER BREAST MEDIUM (GAUZE/BANDAGES/DRESSINGS) ×2 IMPLANT
BINDER BREAST XLRG (GAUZE/BANDAGES/DRESSINGS) IMPLANT
BINDER BREAST XXLRG (GAUZE/BANDAGES/DRESSINGS) IMPLANT
BIOPATCH RED 1 DISK 7.0 (GAUZE/BANDAGES/DRESSINGS) ×2 IMPLANT
BIOPATCH RED 1IN DISK 7.0MM (GAUZE/BANDAGES/DRESSINGS) ×2
BLADE CLIPPER SURG (BLADE) IMPLANT
BLADE HEX COATED 2.75 (ELECTRODE) ×2 IMPLANT
BLADE SURG 10 STRL SS (BLADE) ×3 IMPLANT
BLADE SURG 15 STRL LF DISP TIS (BLADE) ×1 IMPLANT
BLADE SURG 15 STRL SS (BLADE) ×3
CANISTER SUCT 1200ML W/VALVE (MISCELLANEOUS) ×3 IMPLANT
CHLORAPREP W/TINT 26 (MISCELLANEOUS) ×3 IMPLANT
CLIP APPLIE 11 MED OPEN (CLIP) IMPLANT
CLIP APPLIE 9.375 MED OPEN (MISCELLANEOUS) IMPLANT
CLIP VESOCCLUDE SM WIDE 6/CT (CLIP) IMPLANT
CLOSURE WOUND 1/2 X4 (GAUZE/BANDAGES/DRESSINGS) ×2
COVER BACK TABLE REUSABLE LG (DRAPES) ×3 IMPLANT
COVER MAYO STAND REUSABLE (DRAPES) ×3 IMPLANT
COVER PROBE W GEL 5X96 (DRAPES) ×3 IMPLANT
COVER WAND RF STERILE (DRAPES) IMPLANT
DECANTER SPIKE VIAL GLASS SM (MISCELLANEOUS) IMPLANT
DERMABOND ADVANCED (GAUZE/BANDAGES/DRESSINGS) ×4
DERMABOND ADVANCED .7 DNX12 (GAUZE/BANDAGES/DRESSINGS) ×1 IMPLANT
DRAIN CHANNEL 19F RND (DRAIN) ×5 IMPLANT
DRAPE TOP ARMCOVERS (MISCELLANEOUS) ×3 IMPLANT
DRAPE U-SHAPE 76X120 STRL (DRAPES) ×3 IMPLANT
DRAPE UTILITY XL STRL (DRAPES) ×3 IMPLANT
DRSG PAD ABDOMINAL 8X10 ST (GAUZE/BANDAGES/DRESSINGS) ×7 IMPLANT
DRSG TEGADERM 4X4.75 (GAUZE/BANDAGES/DRESSINGS) ×4 IMPLANT
ELECT BLADE 4.0 EZ CLEAN MEGAD (MISCELLANEOUS)
ELECT REM PT RETURN 9FT ADLT (ELECTROSURGICAL) ×3
ELECTRODE BLDE 4.0 EZ CLN MEGD (MISCELLANEOUS) IMPLANT
ELECTRODE REM PT RTRN 9FT ADLT (ELECTROSURGICAL) ×1 IMPLANT
EVACUATOR SILICONE 100CC (DRAIN) ×5 IMPLANT
GAUZE SPONGE 4X4 12PLY STRL LF (GAUZE/BANDAGES/DRESSINGS) IMPLANT
GLOVE BIO SURGEON STRL SZ7 (GLOVE) ×3 IMPLANT
GLOVE BIOGEL PI IND STRL 7.5 (GLOVE) ×1 IMPLANT
GLOVE BIOGEL PI INDICATOR 7.5 (GLOVE) ×2
GOWN STRL REUS W/ TWL LRG LVL3 (GOWN DISPOSABLE) ×3 IMPLANT
GOWN STRL REUS W/TWL LRG LVL3 (GOWN DISPOSABLE) ×9
HEMOSTAT ARISTA ABSORB 3G PWDR (HEMOSTASIS) ×2 IMPLANT
ILLUMINATOR WAVEGUIDE N/F (MISCELLANEOUS) IMPLANT
LIGHT WAVEGUIDE WIDE FLAT (MISCELLANEOUS) ×1 IMPLANT
NDL HYPO 25X1 1.5 SAFETY (NEEDLE) IMPLANT
NDL SAFETY ECLIPSE 18X1.5 (NEEDLE) IMPLANT
NEEDLE HYPO 18GX1.5 SHARP (NEEDLE)
NEEDLE HYPO 25X1 1.5 SAFETY (NEEDLE) ×3 IMPLANT
NS IRRIG 1000ML POUR BTL (IV SOLUTION) ×3 IMPLANT
PACK BASIN DAY SURGERY FS (CUSTOM PROCEDURE TRAY) ×3 IMPLANT
PENCIL BUTTON HOLSTER BLD 10FT (ELECTRODE) ×3 IMPLANT
PIN SAFETY STERILE (MISCELLANEOUS) ×3 IMPLANT
SHEET MEDIUM DRAPE 40X70 STRL (DRAPES) ×2 IMPLANT
SLEEVE SCD COMPRESS KNEE MED (MISCELLANEOUS) ×3 IMPLANT
SPONGE LAP 18X18 RF (DISPOSABLE) ×5 IMPLANT
SPONGE LAP 4X18 RFD (DISPOSABLE) IMPLANT
STRIP CLOSURE SKIN 1/2X4 (GAUZE/BANDAGES/DRESSINGS) ×2 IMPLANT
SUT ETHILON 2 0 FS 18 (SUTURE) ×7 IMPLANT
SUT MNCRL AB 4-0 PS2 18 (SUTURE) ×4 IMPLANT
SUT SILK 2 0 SH (SUTURE) ×2 IMPLANT
SUT VIC AB 2-0 SH 27 (SUTURE) ×15
SUT VIC AB 2-0 SH 27XBRD (SUTURE) IMPLANT
SUT VIC AB 3-0 54X BRD REEL (SUTURE) IMPLANT
SUT VIC AB 3-0 BRD 54 (SUTURE)
SUT VIC AB 3-0 SH 27 (SUTURE) ×6
SUT VIC AB 3-0 SH 27X BRD (SUTURE) IMPLANT
SUT VICRYL 3-0 CR8 SH (SUTURE) ×5 IMPLANT
SYR CONTROL 10ML LL (SYRINGE) ×4 IMPLANT
TOWEL GREEN STERILE FF (TOWEL DISPOSABLE) ×6 IMPLANT
TUBE CONNECTING 20'X1/4 (TUBING) ×1
TUBE CONNECTING 20X1/4 (TUBING) ×2 IMPLANT
YANKAUER SUCT BULB TIP NO VENT (SUCTIONS) ×3 IMPLANT

## 2018-09-16 NOTE — Anesthesia Procedure Notes (Signed)
Procedure Name: LMA Insertion Date/Time: 09/16/2018 12:04 PM Performed by: Lyndee Leo, CRNA Pre-anesthesia Checklist: Patient identified, Emergency Drugs available, Suction available and Patient being monitored Patient Re-evaluated:Patient Re-evaluated prior to induction Oxygen Delivery Method: Circle system utilized Preoxygenation: Pre-oxygenation with 100% oxygen Induction Type: IV induction Ventilation: Mask ventilation without difficulty LMA: LMA inserted LMA Size: 4.0 Number of attempts: 1 Airway Equipment and Method: Bite block Placement Confirmation: positive ETCO2 Tube secured with: Tape Dental Injury: Teeth and Oropharynx as per pre-operative assessment

## 2018-09-16 NOTE — Op Note (Signed)
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 Left risk reducing mastectomy 2. Right mastectomy 3. Right deep axillary sentinel node biopsy 4. Injection blue dye for sentinel node identification Surgeon: Dr Serita Grammes Asst: Carlena Hurl EBL: 25 cc Drains 37 Fr Blake drains to both sides Specimens: 1. Left breast marked short superior, long lateral 2. Right breast marked short superior, long lateral 3. Right deep axillary sentinel nodes  Complications none Sponge and needle count correct times two dispo to recovery stable  Indications: This is a 39 yof who palpated a right breast mass.  On mm she was noted to have an area of about 3.4 cm that biopsies showed a grade II ILC that is er/pr pos. Nodes are normal radiologically and clinically.  She had mri that confirmed this and shows what appears to be more extensive [place].  She declined reconstruction. She desired a rrm on left.   Procedure: After informed consent was obtained the patient first underwent bilateral pectoral blocks and was injected with technetium in the standard periareolar fashion on the right. She was given antibiotics. SCDs were in place. She was then placed under general anesthesia without complication. She was prepped and draped in the standard sterile surgical fashion. A surgical timeout was then performed.  I first injected 5 cc of a methylene blue saline mixture in the right periareolar position and massaged this.  I did the left risk reducing mastectomy first. I marked an elliptical incision to include the nac.  I then made an incision and created flaps to the parasternal region, clavicle, latissimus, and inframammary fold. I then remove the breast and the pectoralis fascia from the pectoralis muscle. I then marked this as above. This was passed off the table. I then  obtained hemostasis. I then placed a 19 Fr Blake drain and  secured this with a 2-0 nylon.  I then closed the lateral skin to the chest wall using 2-0 vicryl. The skin was closed with 3-0 vicryl and 4-0 monocryl. Glue and steristrips were applied.   I then made a similar incision on the right side. I created flaps in a similar fashion as the other side. I then removed the breast and the pectoralis fashion from the pectoralis muscle.  I then was in the axilla. I identified a small nest of blue nodes. They were also radioactive.  I removed these through the same incision and sent them off separately. There were no palpable nodes. I then obtained hemostasis. I then placed a 19 Fr Blake drain and secured this with a 2-0 nylon.  I then closed the lateral skin to the chest wall using 2-0 vicryl. The skin was closed with 3-0 vicryl and 4-0 monocryl. Glue and steristrips were applied.

## 2018-09-16 NOTE — Interval H&P Note (Signed)
History and Physical Interval Note:  09/16/2018 11:30 AM  Megan Armstrong  has presented today for surgery, with the diagnosis of RIGHT BREAST CANCER.  The various methods of treatment have been discussed with the patient and family. After consideration of risks, benefits and other options for treatment, the patient has consented to  Procedure(s): RIGHT MASTECTOMY WITH RIGHT AXILLARY SENTINEL LYMPH NODE BIOPSY AND LEFT RISK REDUCING MASTECTOMY (Bilateral) as a surgical intervention.  The patient's history has been reviewed, patient examined, no change in status, stable for surgery.  I have reviewed the patient's chart and labs.  Questions were answered to the patient's satisfaction.     Rolm Bookbinder

## 2018-09-16 NOTE — H&P (Signed)
  89 yof referred by Dr Regis Bill for new right breast cancer. she had nl mm in October. she then felt right breast mass. mm shows c density breasts. she had uiq mass with distortion, superior right breast mass and two small areas of retroareolar calcifications. on [place] the uiq mass is 1.7 cm and the superior mass is 0.9 cm. the total area between them is 3.4 cm. the right axillary [place] is negative. biopsy of both breast lesions are grade II ILC with LCIS, er pos at 37, pr pos at 2, her 2 negative and Ki is 10%.   Past Surgical History  Breast Biopsy  Right. Colon Polyp Removal - Colonoscopy   Diagnostic Studies History  Colonoscopy  1-5 years ago  Allergies  Codeine/Codeine Derivatives  Penicillins  Latex  Cipro *Fluoroquinolones**  Allergies Reconciled   Medication History No Current Medications Medications Reconciled  Social History  Caffeine use  Carbonated beverages. No drug use  Tobacco use  Never smoker.  Family History  Respiratory Condition  Mother.  Pregnancy / Birth History  Age at menarche  28 years. Gravida  3 Regular periods   Other Problems Migraine Headache   Review of Systems  General Not Present- Appetite Loss, Chills, Fatigue, Fever, Night Sweats, Weight Gain and Weight Loss. Skin Not Present- Change in Wart/Mole, Dryness, Hives, Jaundice, New Lesions, Non-Healing Wounds, Rash and Ulcer. HEENT Present- Seasonal Allergies and Wears glasses/contact lenses. Not Present- Earache, Hearing Loss, Hoarseness, Nose Bleed, Oral Ulcers, Ringing in the Ears, Sinus Pain, Sore Throat, Visual Disturbances and Yellow Eyes. Respiratory Not Present- Bloody sputum, Chronic Cough, Difficulty Breathing, Snoring and Wheezing. Musculoskeletal Present- Back Pain and Joint Stiffness. Not Present- Joint Pain, Muscle Pain, Muscle Weakness and Swelling of Extremities. Neurological Not Present- Decreased Memory, Fainting, Headaches, Numbness, Seizures, Tingling,  Tremor, Trouble walking and Weakness. Endocrine Not Present- Cold Intolerance, Excessive Hunger, Hair Changes, Heat Intolerance, Hot flashes and New Diabetes.  Vitals  Weight: 115 lb Height: 62in Body Surface Area: 1.51 m Body Mass Index: 21.03 kg/m  Temp.: 99.64F(Oral)  Pulse: 99 (Regular)  BP: 126/82 (Sitting, Left Arm, Standard) Physical Exam  General Mental Status-Alert. Head and Neck Trachea-midline. Eye Sclera/Conjunctiva - Bilateral-No scleral icterus. Breast Nipples-No Discharge. Neurologic Neurologic evaluation reveals -alert and oriented x 3 with no impairment of recent or remote memory. Lymphatic Head & Neck General Head & Neck Lymphatics: Bilateral - Description - Normal. Axillary General Axillary Region: Bilateral - Description - Normal. Note: no [place] adenopathy   Assessment & Plan  BREAST CANCER OF UPPER-INNER QUADRANT OF RIGHT FEMALE BREAST (C50.211) Bilateral mastectomies with right ax sn biopsy we discussed staging and pathophysiology of breast cancer and all available treatment options. I think she is likely going to need mastectomy on the right pending mri due to [facility]. we can see how to address RA calcs once we get mri and see what type mastectomy can be offered. we discussed bilateral and fact that not really medically necessary. she is not candidate for genetics but offered her appt if she chose to do that. discussed rationale and performance of sn biopsy as well at time of surgery I will talk to them after mri and make final decision after plastic surgery they understand all other treatment decisions based on surgery. no role in premenopausal women for primary antiestrogen therapy either.

## 2018-09-16 NOTE — Anesthesia Procedure Notes (Signed)
Anesthesia Regional Block: Pectoralis block   Pre-Anesthetic Checklist: ,, timeout performed, Correct Patient, Correct Site, Correct Laterality, Correct Procedure, Correct Position, site marked, Risks and benefits discussed,  Surgical consent,  Pre-op evaluation,  At surgeon's request and post-op pain management  Laterality: Left and Right  Prep: chloraprep       Needles:  Injection technique: Single-shot  Needle Type: Stimiplex     Needle Length: 9cm  Needle Gauge: 21     Additional Needles:   Procedures:,,,, ultrasound used (permanent image in chart),,,,  Narrative:  Start time: 09/16/2018 11:38 AM End time: 09/16/2018 11:43 AM Injection made incrementally with aspirations every 5 mL.  Performed by: Personally  Anesthesiologist: Lynda Rainwater, MD

## 2018-09-16 NOTE — Anesthesia Preprocedure Evaluation (Signed)
Anesthesia Evaluation  Patient identified by MRN, date of birth, ID band Patient awake    Reviewed: Allergy & Precautions, NPO status , Patient's Chart, lab work & pertinent test results  Airway Mallampati: II  TM Distance: >3 FB Neck ROM: Full    Dental no notable dental hx.    Pulmonary neg pulmonary ROS,    Pulmonary exam normal breath sounds clear to auscultation       Cardiovascular negative cardio ROS Normal cardiovascular exam Rhythm:Regular Rate:Normal     Neuro/Psych  Headaches, negative psych ROS   GI/Hepatic negative GI ROS, Neg liver ROS,   Endo/Other  negative endocrine ROS  Renal/GU negative Renal ROS  negative genitourinary   Musculoskeletal negative musculoskeletal ROS (+)   Abdominal   Peds negative pediatric ROS (+)  Hematology negative hematology ROS (+)   Anesthesia Other Findings Breast Cancer  Reproductive/Obstetrics negative OB ROS                             Anesthesia Physical Anesthesia Plan  ASA: III  Anesthesia Plan: General   Post-op Pain Management:  Regional for Post-op pain   Induction: Intravenous  PONV Risk Score and Plan: 3 and Ondansetron, Dexamethasone and Midazolam  Airway Management Planned: LMA and Oral ETT  Additional Equipment:   Intra-op Plan:   Post-operative Plan: Extubation in OR  Informed Consent: I have reviewed the patients History and Physical, chart, labs and discussed the procedure including the risks, benefits and alternatives for the proposed anesthesia with the patient or authorized representative who has indicated his/her understanding and acceptance.     Dental advisory given  Plan Discussed with: CRNA  Anesthesia Plan Comments:         Anesthesia Quick Evaluation

## 2018-09-16 NOTE — Progress Notes (Signed)
Assisted Dr. Sabra Heck with right, left, ultrasound guided, pectoralis block. Side rails up, monitors on throughout procedure. See vital signs in flow sheet. Tolerated Procedure well.

## 2018-09-16 NOTE — Transfer of Care (Signed)
Immediate Anesthesia Transfer of Care Note  Patient: Press photographer  Procedure(s) Performed: RIGHT MASTECTOMY WITH RIGHT AXILLARY SENTINEL LYMPH NODE BIOPSY AND LEFT RISK REDUCING MASTECTOMY (Bilateral Breast)  Patient Location: PACU  Anesthesia Type:GA combined with regional for post-op pain  Level of Consciousness: sedated, patient cooperative and lethargic  Airway & Oxygen Therapy: Patient Spontanous Breathing and Patient connected to nasal cannula oxygen  Post-op Assessment: Report given to RN and Post -op Vital signs reviewed and stable  Post vital signs: Reviewed and stable  Last Vitals:  Vitals Value Taken Time  BP 104/75 09/16/2018  2:00 PM  Temp    Pulse 96 09/16/2018  2:01 PM  Resp 16 09/16/2018  2:01 PM  SpO2 100 % 09/16/2018  2:01 PM  Vitals shown include unvalidated device data.  Last Pain:  Vitals:   09/16/18 1055  TempSrc: Oral  PainSc: 0-No pain         Complications: No apparent anesthesia complications

## 2018-09-16 NOTE — Anesthesia Postprocedure Evaluation (Signed)
Anesthesia Post Note  Patient: Press photographer  Procedure(s) Performed: RIGHT MASTECTOMY WITH RIGHT AXILLARY SENTINEL LYMPH NODE BIOPSY AND LEFT RISK REDUCING MASTECTOMY (Bilateral Breast)     Patient location during evaluation: PACU Anesthesia Type: General Level of consciousness: awake and alert Pain management: pain level controlled Vital Signs Assessment: post-procedure vital signs reviewed and stable Respiratory status: spontaneous breathing, nonlabored ventilation and respiratory function stable Cardiovascular status: blood pressure returned to baseline and stable Postop Assessment: no apparent nausea or vomiting Anesthetic complications: no    Last Vitals:  Vitals:   09/16/18 1430 09/16/18 1445  BP: 126/74 129/77  Pulse: 79 91  Resp: 12 12  Temp:    SpO2: 100% 100%    Last Pain:  Vitals:   09/16/18 1430  TempSrc:   PainSc: 0-No pain                 Lynda Rainwater

## 2018-09-17 ENCOUNTER — Encounter (HOSPITAL_BASED_OUTPATIENT_CLINIC_OR_DEPARTMENT_OTHER): Payer: Self-pay | Admitting: General Surgery

## 2018-09-17 DIAGNOSIS — C50211 Malignant neoplasm of upper-inner quadrant of right female breast: Secondary | ICD-10-CM | POA: Diagnosis not present

## 2018-09-17 NOTE — Discharge Summary (Signed)
Physician Discharge Summary  Patient ID: Megan Armstrong MRN: [id number] DOB/AGE: [birth year] 53 y.o.  Admit date: 09/16/2018 Discharge date: 09/17/2018  Admission Diagnoses: Right breast cancer Family history breast cancer  Discharge Diagnoses:  Active Problems:   Breast cancer, right [facility])   Discharged Condition: good  Hospital Course: 76 yof with stage II ILC on right. Underwent right mastectomy with sn biopsy and left risk reducing mastectomy. Doing well following morning and will be discharged home  Consults: None  Significant Diagnostic Studies: none  Treatments: surgery: bilateral mastectomies, right ax sn biopsy  Discharge Exam: Blood pressure 107/66, pulse 74, temperature (!) 97.1 F (36.2 C), resp. rate 16, height 5\' 2"  (1.575 m), weight 52.2 kg, last menstrual period 08/28/2018, SpO2 100 %. Incision/Wound: Bilateral mastectomy sites without hematoma, flaps viable, drains serosang  Disposition: Discharge disposition: 01-Home or [place]    Rolm Bookbinder, MD In 1 week.   Specialty:  General Surgery Contact information: [address] [place] [address] [phone]           Signed: Rolm Bookbinder 09/17/2018, 8:41 AM

## 2018-09-17 NOTE — Discharge Instructions (Signed)
[facility] °[phone] ° °MASTECTOMY: POST OP INSTRUCTIONS °Take 400 mg of ibuprofen every 8 hours or 650 mg tylenol every 6 hours for next 72 hours then as needed. Use ice several times daily also. °Always review your discharge instruction sheet given to you by the facility where your surgery was performed. ° °IF YOU HAVE DISABILITY OR FAMILY LEAVE FORMS, YOU MUST BRING THEM TO THE OFFICE FOR PROCESSING.   °DO NOT GIVE THEM TO YOUR DOCTOR. °A prescription for pain medication may be given to you upon discharge.  Take your pain medication as prescribed, if needed.  If narcotic pain medicine is not needed, then you may take acetaminophen (Tylenol), naprosyn (Alleve) or ibuprofen (Advil) as needed. °1. Take your usually prescribed medications unless otherwise directed. °2. If you need a refill on your pain medication, please contact your pharmacy.  They will contact our office to request authorization.  Prescriptions will not be filled after 5pm or on week-ends. °3. You should follow a light diet the first few days after arrival home, such as soup and crackers, etc.  Resume your normal diet the day after surgery. °4. Most patients will experience some swelling and bruising on the chest and underarm.  Ice packs will help.  Swelling and bruising can take several days to resolve. Wear the binder day and night until you return to the office.  °5. It is common to experience some constipation if taking pain medication after surgery.  Increasing fluid intake and taking a stool softener (such as Colace) will usually help or prevent this problem from occurring.  A mild laxative (Milk of Magnesia or Miralax) should be taken according to package instructions if there are no bowel movements after 48 hours. °6. Unless discharge instructions indicate otherwise, leave your bandage dry and in place until your next appointment in 3-5 days.  You may take a limited sponge bath.  No tube baths or showers until the  drains are removed.  You may have steri-strips (small skin tapes) in place directly over the incision.  These strips should be left on the skin for 7-10 days. If you have glue it will come off in next couple week.  Any sutures will be removed at an office visit °7. DRAINS:  If you have drains in place, it is important to keep a list of the amount of drainage produced each day in your drains.  Before leaving the hospital, you should be instructed on drain care.  Call our office if you have any questions about your drains. I will remove your drains when they put out less than 30 cc or ml for 2 consecutive days. °8. ACTIVITIES:  You may resume regular (light) daily activities beginning the next day--such as daily self-care, walking, climbing stairs--gradually increasing activities as tolerated.  You may have sexual intercourse when it is comfortable.  Refrain from any heavy lifting or straining until approved by your doctor. °a. You may drive when you are no longer taking prescription pain medication, you can comfortably wear a seatbelt, and you can safely maneuver your car and apply brakes. °b. RETURN TO WORK:  __________________________________________________________ °9. You should see your doctor in the office for a follow-up appointment approximately 3-5 days after your surgery.  Your doctor’s nurse will typically make your follow-up appointment when she calls you with your pathology report.  Expect your pathology report 3-4business days after surgery. °10. OTHER INSTRUCTIONS: ______________________________________________________________________________________________ ____________________________________________________________________________________________ °WHEN TO CALL YOUR DR WAKEFIELD: °1. Fever over 101.0 °  Nausea and/or vomiting 3. Extreme swelling or bruising 4. Continued bleeding from incision. 5. Increased pain, redness, or drainage from the incision. The clinic staff is available to answer your  questions during regular business hours.  Please don't hesitate to call and ask to speak to one of the nurses for clinical concerns.  If you have a medical emergency, go to the nearest emergency room or call 911.  A surgeon from [facility] is always on call at the hospital. [address], [address], [place], [place]  [address] ? [address] [address], [place], [place]   [address] [phone] ? [phone] ? FAX [phone] Web site: [email or web address]   About my Jackson-Pratt Bulb Drain  What is a Jackson-Pratt bulb? A Jackson-Pratt is a soft, round device used to collect drainage. It is connected to a long, thin drainage catheter, which is held in place by one or two small stiches near your surgical incision site. When the bulb is squeezed, it forms a vacuum, forcing the drainage to empty into the bulb.  Emptying the Jackson-Pratt bulb- To empty the bulb: 1. Release the plug on the top of the bulb. 2. Pour the bulb's contents into a measuring container which your nurse will provide. 3. Record the time emptied and amount of drainage. Empty the drain(s) as often as your     doctor or nurse recommends.  Date                  Time                    Amount (Drain 1)                 Amount (Drain 2)  _____________________________________________________________________  _____________________________________________________________________  _____________________________________________________________________  _____________________________________________________________________  _____________________________________________________________________  _____________________________________________________________________  _____________________________________________________________________  _____________________________________________________________________  Squeezing the Jackson-Pratt Bulb- To squeeze the bulb: 1. Make sure the plug at the top of the bulb  is open. 2. Squeeze the bulb tightly in your fist. You will hear air squeezing from the bulb. 3. Replace the plug while the bulb is squeezed. 4. Use a safety pin to attach the bulb to your clothing. This will keep the catheter from     pulling at the bulb insertion site.  When to call your doctor- Call your doctor if:  Drain site becomes red, swollen or hot.  You have a fever greater than 101 degrees F.  There is oozing at the drain site.  Drain falls out (apply a guaze bandage over the drain hole and secure it with tape).  Drainage increases daily not related to activity patterns. (You will usually have more drainage when you are active than when you are resting.)  Drainage has a bad odor.   

## 2018-09-18 NOTE — Addendum Note (Signed)
Addendum  created 09/18/18 0944 by Tawni Millers, CRNA   Charge Capture section accepted

## 2018-09-20 ENCOUNTER — Telehealth: Payer: Self-pay | Admitting: *Deleted

## 2018-09-20 ENCOUNTER — Telehealth: Payer: Self-pay | Admitting: Hematology and Oncology

## 2018-09-20 NOTE — Assessment & Plan Note (Signed)
08/13/2018: Palpable mass at 1230 to 1 o'clock position right breast with skin indentation and focal dimpling UOQ, ultrasound to masses.  12:30 position: 2 cm mass, at 1 o'clock position: 0.9 cm mass, together 3.4 cm.  UOQ: 2 additional masses 0.7 cm and 0.7 cm, indeterminate retroareolar calcifications.  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 Oncotype DX testing/MammaPrint to determine if chemotherapy would be of any benefit followed by 2. Adjuvant radiation therapy depending on the final size and lymph node status 3. Adjuvant antiestrogen therapy with tamoxifen since she is still premenopausal --------------------------------------------------------------------------------------------------------------------------------- Pathology counseling: I discussed the final pathology report of the patient provided  a copy of this report. I discussed the margins as well as lymph node surgeries. We also discussed the final staging along with previously performed ER/PR and HER-2/neu testing.  Return to clinic based upon Oncotype DX test results

## 2018-09-20 NOTE — Telephone Encounter (Signed)
I talk with patient regarding video visit °

## 2018-09-20 NOTE — Telephone Encounter (Signed)
Received order for oncotype testing. Requisition faxed to pathology and [facility] °

## 2018-09-25 NOTE — Progress Notes (Signed)
HEMATOLOGY-ONCOLOGY MYCHART VIDEO VISIT PROGRESS NOTE  I connected with Megan Armstrong on 09/26/2018 at  8:15 AM EDT by MyChart video conference and verified that I am speaking with the correct person using two identifiers.  I discussed the limitations, risks, security and privacy concerns of performing an evaluation and management service by MyChart and the availability of in person appointments.  I also discussed with the patient that there may be a patient responsible charge related to this service. The patient expressed understanding and agreed to proceed.  Patient's Location: Home Physician Location: Clinic  CHIEF COMPLIANT: Follow-up s/p bilateral mastectomies to review pathology  INTERVAL HISTORY: Megan Armstrong is a 53 y.o. female with above-mentioned history of right breast cancer. She underwent bilateral mastectomies on 09/16/18 with Dr. Donne Hazel for which pathology confirmed benign left breast and right breast invasive lobular carcinoma, 3.2cm, grade 2, clear margins, and isolated tumor cells present in one right sentinel lymph node. Genetic testing was negative. Oncotype testing is pending. She presents today over MyChart to review the pathology report and discuss further testing.  She is experiencing moderate amount of pain related to the mastectomies.  She is healing and recovering very well.   Oncology History  Malignant neoplasm of upper-inner quadrant of right breast in female, estrogen receptor positive ([place])  08/13/2018 Initial Diagnosis   Palpable mass at 1230 to 1 o'clock position right breast with skin indentation and focal dimpling UOQ, ultrasound to masses.  12:30 position: 2 cm mass, at 1 o'clock position: 0.9 cm mass, together 3.4 cm.  UOQ: 2 additional masses 0.7 cm and 0.7 cm, indeterminate retroareolar calcifications.   08/13/2018 Pathology Results   Right breast needle core biopsy 1230: Grade 2 ILC with LCIS, UOQ 10:30 position: Grade 2 ILC with LCIS, ER 90%, PR 30%,  Ki-67 10%, HER-2 -1+ by IHC.  T1c N0 stage Ia clinical stage   09/06/2018 Genetic Testing   Negative genetic testing on the CustomNext-Cancer+RNAinsight testing. 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  The CustomNext-Expanded gene panel offered by [facility] and includes sequencing and rearrangement analysis for the following 81 genes: AIP, ALK, APC*, ATM*, AXIN2, BAP1, BARD1, BLM, BMPR1A, BRCA1*, BRCA2*, BRIP1*, CDC73, CDH1*, CDK4, CDKN1B, CDKN2A, CHEK2*, CTNNA1, DICER1, FANCC, FH, FLCN, GALNT12, HOXB13, KIT, MAX, MEN1, MET, MLH1*, MRE11A, MSH2*, MSH6*, MUTYH*, NBN, NF1*, NF2, NTHL1, PALB2*, PDGFRA, PHOX2B, PMS2*, POLD1, POLE, POT1, PRKAR1A, PTCH1, PTEN*, RAD50, RAD51C*, RAD51D*, RB1, RET, SDHA, SDHAF2, SDHB, SDHC, SDHD, SMAD4, SMARCA4, SMARCB1, SMARCE1, STK11, SUFU, TMEM127, TP53*, TSC1, TSC2, VHL and XRCC2 (sequencing and deletion/duplication); CASR, CFTR, CPA1, CTRC, EGFR, MITF, PRSS1 and SPINK1 (sequencing only); EPCAM and GREM1 (deletion/duplication only). DNA and RNA analyses performed for * genes.  The report date is Sep 06, 2018.   09/16/2018 Surgery   Bilateral mastectomies: Left mastectomy benign, right mastectomy: Invasive lobular cancer, multifocal, largest tumor 3.2 cm, grade 2, margins negative, 1/2 lymph nodes with isolated tumor cells, ER 90%, PR 70%, HER-2 -1+, Ki-67 10% T2 N0 I+ stage Ia   09/20/2018 Cancer Staging   Staging form: Breast, AJCC 8th Edition - Pathologic stage from 09/20/2018: Stage IA (pT2, pN0(i+)(sn), cM0, G2, ER+, PR+, HER2-) - Signed by Nicholas Lose, MD on 09/20/2018     REVIEW OF SYSTEMS:   Constitutional: Denies fevers, chills or abnormal weight loss Eyes: Denies blurriness of vision Ears, nose, mouth, throat, and face: Denies mucositis or sore throat Respiratory: Denies cough, dyspnea or wheezes Cardiovascular: Denies palpitation, chest discomfort Gastrointestinal:  Denies nausea, heartburn or change in  bowel habits Skin: Denies abnormal skin rashes  Lymphatics: Denies new lymphadenopathy or easy bruising Neurological:Denies numbness, tingling or new weaknesses Behavioral/Psych: Mood is stable, no new changes  Extremities: No lower extremity edema Breast: denies any pain or lumps or nodules in either breasts All other systems were reviewed with the patient and are negative.  Observations/Objective:  There were no vitals filed for this visit. There is no height or weight on file to calculate BMI.  I have reviewed the data as listed CMP Latest Ref Rng & Units 04/08/2018 01/22/2017 12/21/2015  Glucose 70 - 99 mg/dL 86 97 82  BUN 6 - 23 mg/dL _0 Creatinine 0.40 - 1.20 mg/dL 0.81 0.74 0.77  Sodium 135 - 145 mEq/L 135 138 137  Potassium 3.5 - 5.1 mEq/L 4.1 4.8 3.9  Chloride 96 - 112 mEq/L 103 105 106  CO2 19 - 32 mEq/L _1 Calcium 8.4 - 10.5 mg/dL 9.2 9.1 8.8  Total Protein 6.0 - 8.3 g/dL 6.7 6.5 7.1  Total Bilirubin 0.2 - 1.2 mg/dL 0.4 0.5 0.8  Alkaline Phos 39 - 117 U/L 40 41 37(L)  AST 0 - 37 U/L _2 ALT 0 - 35 U/L _3 Lab Results  Component Value Date   WBC 7.0 04/08/2018   HGB 13.2 04/08/2018   HCT 39.7 04/08/2018   MCV 86.0 04/08/2018   PLT 261.0 04/08/2018   NEUTROABS 4.8 04/08/2018      Assessment Plan:  Malignant neoplasm of upper-inner quadrant of right breast in female, estrogen receptor positive ([place]) 08/13/2018: Palpable mass at 1230 to 1 o'clock position right breast with skin indentation and focal dimpling UOQ, ultrasound to masses.  12:30 position: 2 cm mass, at 1 o'clock position: 0.9 cm mass, together 3.4 cm.  UOQ: 2 additional masses 0.7 cm and 0.7 cm, indeterminate retroareolar calcifications.  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  T1c N0 stage Ia clinical stage  09/16/2018:Bilateral mastectomies: Left mastectomy benign, right mastectomy: Invasive lobular cancer,  multifocal, largest tumor 3.2 cm, grade 2, margins negative, 1/2 lymph nodes with isolated tumor cells, ER 90%, PR 70%, HER-2 -1+, Ki-67 10% T2 N0 I+ stage Ia  Recommendations: 1. Oncotype DX testing/MammaPrint to determine if chemotherapy would be of any benefit followed by 2. Adjuvant radiation therapy depending on the final size and lymph node status 3. Adjuvant antiestrogen therapy with tamoxifen since she is still premenopausal --------------------------------------------------------------------------------------------------------------------------------- Pathology counseling: I discussed the final pathology report of the patient provided  a copy of this report. I discussed the margins as well as lymph node surgeries. We also discussed the final staging along with previously performed ER/PR and HER-2/neu testing.  Tamoxifen Toxicities:  May 12-June 1st Pre-op Tamoxifen She will resume tamoxifen at this time.  We will present her in the tumor board regarding the role of adjuvant radiation. I will discuss with Dr. Girtha Hake regarding uterine surveillance while on tamoxifen. If she stops having menstrual cycles then we can check her for [facility] and estradiol to see if she is in menopause and switch her to aromatase inhibitors. Did not experience any side effects to tamoxifen so far.  Return to clinic based upon Oncotype DX test results With the Oncotype is low risk then she will have a survivorship care plan visit in 3 months.   I  discussed the assessment and treatment plan with the patient. The patient was provided an opportunity to ask questions and all were answered. The patient agreed with the plan and demonstrated an understanding of the instructions. The patient was advised to call back or seek an in-person evaluation if the symptoms worsen or if the condition fails to improve as anticipated.   I provided 15 minutes of face-to-face MyChart video visit time during this encounter.    Rulon Eisenmenger, MD 09/26/2018   I, Molly Dorshimer, am acting as scribe for Nicholas Lose, MD.  I have reviewed the above documentation for accuracy and completeness, and I agree with the above.

## 2018-09-26 ENCOUNTER — Inpatient Hospital Stay: Payer: Managed Care, Other (non HMO) | Attending: Hematology and Oncology | Admitting: Hematology and Oncology

## 2018-09-26 DIAGNOSIS — C50211 Malignant neoplasm of upper-inner quadrant of right female breast: Secondary | ICD-10-CM | POA: Diagnosis not present

## 2018-09-26 DIAGNOSIS — Z170 Estrogen receptor positive status [ER+]: Secondary | ICD-10-CM | POA: Diagnosis not present

## 2018-09-26 DIAGNOSIS — Z9013 Acquired absence of bilateral breasts and nipples: Secondary | ICD-10-CM

## 2018-10-02 ENCOUNTER — Telehealth: Payer: Self-pay | Admitting: Adult Health

## 2018-10-02 ENCOUNTER — Telehealth: Payer: Self-pay | Admitting: *Deleted

## 2018-10-02 ENCOUNTER — Encounter: Payer: Self-pay | Admitting: *Deleted

## 2018-10-02 NOTE — Telephone Encounter (Signed)
Scheduled appt per 6/24 sch message- mailed letter with appt date and time

## 2018-10-02 NOTE — Telephone Encounter (Signed)
Received oncotype score of 17/5%. Physician team notified. Called pt with results and discussed she does not need chemotherapy as well as radiation per tumor board discuss

## 2018-10-04 ENCOUNTER — Encounter: Payer: Self-pay | Admitting: Hematology and Oncology

## 2018-10-10 ENCOUNTER — Other Ambulatory Visit: Payer: Self-pay

## 2018-10-10 ENCOUNTER — Ambulatory Visit: Payer: Managed Care, Other (non HMO) | Attending: General Surgery | Admitting: Physical Therapy

## 2018-10-10 ENCOUNTER — Encounter: Payer: Self-pay | Admitting: Physical Therapy

## 2018-10-10 DIAGNOSIS — M25511 Pain in right shoulder: Secondary | ICD-10-CM | POA: Insufficient documentation

## 2018-10-10 DIAGNOSIS — R293 Abnormal posture: Secondary | ICD-10-CM | POA: Diagnosis present

## 2018-10-10 DIAGNOSIS — M6281 Muscle weakness (generalized): Secondary | ICD-10-CM | POA: Diagnosis present

## 2018-10-10 DIAGNOSIS — M25612 Stiffness of left shoulder, not elsewhere classified: Secondary | ICD-10-CM | POA: Insufficient documentation

## 2018-10-10 DIAGNOSIS — M25611 Stiffness of right shoulder, not elsewhere classified: Secondary | ICD-10-CM | POA: Diagnosis present

## 2018-10-10 DIAGNOSIS — M79604 Pain in right leg: Secondary | ICD-10-CM | POA: Diagnosis present

## 2018-10-10 DIAGNOSIS — Z483 Aftercare following surgery for neoplasm: Secondary | ICD-10-CM | POA: Diagnosis present

## 2018-10-10 DIAGNOSIS — M25512 Pain in left shoulder: Secondary | ICD-10-CM | POA: Insufficient documentation

## 2018-10-10 NOTE — Therapy (Signed)
[place], [place], [address] Phone: [phone]   Fax:  [phone]  Physical Therapy Evaluation  Patient Details  Name: Megan Armstrong MRN: [id number] Date of Birth: Apr 13, 1965 Referring Provider (PT): Donne Hazel   Encounter Date: 10/10/2018  PT End of Session - 10/10/18 1118    Visit Number  1    Number of Visits  9    Date for PT Re-Evaluation  11/07/18    PT Start Time  1048   pt arrived late   PT Stop Time  1117    PT Time Calculation (min)  29 min    Activity Tolerance  Patient tolerated treatment well    Behavior During Therapy  [facility] for tasks assessed/performed       Past Medical History:  Diagnosis Date  . Allergic rhinitis   . Endometrial hyperplasia   . Endometriosis   . Family history of breast cancer   . Family history of breast cancer   . Hx of endometriosis    rectal vag with laser surgery   . Seasonal allergies   . 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 DIAGNOSTIC LAPAROSCOPY    . MASTECTOMY W/ SENTINEL NODE BIOPSY Bilateral 09/16/2018   Procedure: RIGHT MASTECTOMY WITH RIGHT AXILLARY SENTINEL LYMPH NODE BIOPSY AND LEFT RISK REDUCING MASTECTOMY;  Surgeon: Rolm Bookbinder, MD;  Location: [place];  Service: General;  Laterality: Bilateral;    There were no vitals filed for this visit.   Subjective Assessment - 10/10/18 1051    Subjective  I am having trouble with my shoulder ROM. It feels like a tight band is around my chest. I also have some swelling on the sides and that is when the pain comes. I got my last drain out on Friday June 26th.    Pertinent History  R breast cancer with bilateral mastectomy on 09/16/18 and SLNB (2 nodes both negative), does not need chemo or radiation, endometriosis    Patient Stated Goals  to resume normal functions, drive  again, typing, get ROM back    Currently in Pain?  Yes    Pain Score  7     Pain Location  Chest    Pain Orientation  Right;Left    Pain Descriptors / Indicators  Squeezing    Pain Type  Acute pain;Surgical pain    Pain Onset  1 to 4 weeks ago    Pain Frequency  Constant    Aggravating Factors   rasing arms overhead    Pain Relieving Factors  deep breathing, pain meds    Effect of Pain on Daily Activities  hard to reach out and grab things and lift arms upwards         [facility] PT Assessment - 10/10/18 0001      Assessment   Medical Diagnosis  right breast cancer    Referring Provider (PT)  Donne Hazel    Onset Date/Surgical Date  09/16/18    Hand Dominance  Right    Prior Therapy  none      Precautions   Precautions  Other (comment)    Precaution Comments  at risk for lymphedema      Restrictions   Weight Bearing Restrictions  No      Balance Screen   Has the patient fallen in the past 6 months  No  Has the patient had a decrease in activity level because of a fear of falling?   No    Is the patient reluctant to leave their home because of a fear of falling?   No      Home Environment   Living Environment  Private residence    Living Arrangements  Spouse/significant other;Children    Available Help at Discharge  Family    Type of [place]      Prior Function   Level of [place] with basic ADLs   needs assist with cooking and cleaning   Vocation  Full time employment   currently only working 2 hrs a day   Financial trader for medical practice    Leisure  pt reports she has been trying to do post op breast exercises      Cognition   Overall Cognitive Status  Within Functional Limits for tasks assessed      Observation/Other Assessments   Observations  bilateral mastectomy scars healing with steri strips intact, some swelling inferior to left axilla      Posture/Postural Control   Posture/Postural Control  Postural  limitations    Postural Limitations  Rounded Shoulders      ROM / Strength   AROM / PROM / Strength  AROM      AROM   AROM Assessment Site  Shoulder    Right/Left Shoulder  Right;Left    Right Shoulder Flexion  115 Degrees    Right Shoulder ABduction  91 Degrees    Right Shoulder Internal Rotation  33 Degrees    Right Shoulder External Rotation  77 Degrees    Left Shoulder Flexion  105 Degrees    Left Shoulder ABduction  77 Degrees    Left Shoulder Internal Rotation  70 Degrees    Left Shoulder External Rotation  83 Degrees        LYMPHEDEMA/ONCOLOGY QUESTIONNAIRE - 10/10/18 1104      Type   Cancer Type  right breast cancer      Surgeries   Mastectomy Date  09/16/18    Sentinel Lymph Node Biopsy Date  09/16/18    Number Lymph Nodes Removed  2      Date Lymphedema/Swelling Started   Date  09/16/18      Treatment   Active Chemotherapy Treatment  No    Past Chemotherapy Treatment  No    Active Radiation Treatment  No    Past Radiation Treatment  No    Current Hormone Treatment  Yes    Past Hormone Therapy  No      What other symptoms do you have   Are you Having Heaviness or Tightness  Yes    Are you having Pain  Yes    Are you having pitting edema  No    Is it Hard or Difficult finding clothes that fit  No    Do you have infections  No    Is there Decreased scar mobility  Yes      Lymphedema Assessments   Lymphedema Assessments  Upper extremities      Right Upper Extremity Lymphedema   15 cm Proximal to Olecranon Process  27.5 cm    Olecranon Process  22 cm    15 cm Proximal to Ulnar Styloid Process  21.6 cm    Just Proximal to Ulnar Styloid Process  14.1 cm    Across Hand at [employer]  17.5 cm  At [facility] of 2nd Digit  5.3 cm      Left Upper Extremity Lymphedema   15 cm Proximal to Olecranon Process  26.4 cm    Olecranon Process  22 cm    15 cm Proximal to Ulnar Styloid Process  20.9 cm    Just Proximal to Ulnar Styloid Process  13.7 cm    Across  Hand at [employer]  17.2 cm    At [place] of 2nd Digit  5.4 cm             Objective measurements completed on examination: See above findings.              PT Education - 10/10/18 1118    Education Details  anatomy and physiology of lymphatic system, lymphedema risk reduction practices,    Person(s) Educated  Patient    Methods  Explanation;Handout    Comprehension  Verbalized understanding          PT Long Term Goals - 10/10/18 1124      PT LONG TERM GOAL #1   Title  Pt will demonstrate 160 degrees of bilateral shoulder flexion to allow her to reach overhead.    Baseline  R 115, L 105    Time  4    Period  Weeks    Status  New    Target Date  11/07/18      PT LONG TERM GOAL #2   Title  Pt will demonstrate 160 degrees of bilateral shoulder abduction to allow her to reach out to the side.    Baseline  R 91, L 77    Time  4    Period  Weeks    Status  New    Target Date  11/07/18      PT LONG TERM GOAL #3   Title  Pt will be independent in a home exercise program for continued strengthening and stretching    Time  4    Period  Weeks    Status  New    Target Date  11/07/18      PT LONG TERM GOAL #4   Title  Pt will report a 75% improvement in pain across chest to allow her to return to prior level of function.    Time  4    Period  Weeks    Status  New    Target Date  11/07/18      PT LONG TERM GOAL #5   Title  Pt will report a 75% improvement in swelling in bilateral lateral trunk and inferior axilla to allow improved comfort.    Time  4    Period  Weeks    Status  New    Target Date  11/07/18             Plan - 10/10/18 1119    Clinical Impression Statement  Patient presents to PT following a bilateral mastectomy on 09/16/18 for treatment of right breast cancer and a SLNB. She now has decreased bilateral shoulder ROM with left more limited than right. She also has been having swelling in bilateral axilla due to surgery. She would  benefit from skilled PT services to increase bilateral shoulder ROM, decrease pain across chest, improve scar mobilty and decrease post surgical edema.    Personal Factors and Comorbidities  Time since onset of injury/illness/exacerbation   recent surgery   Examination-Activity Limitations  Bathing;Lift;Reach Overhead;Carry    Examination-Participation Restrictions  Driving;Meal Prep;Yard Work;Cleaning;Laundry  Stability/Clinical Decision Making  Evolving/Moderate complexity    Clinical Decision Making  Moderate    Rehab Potential  Excellent    PT Frequency  2x / week    PT Duration  4 weeks    PT Treatment/Interventions  ADLs/Self Care Home Management;Therapeutic activities;Therapeutic exercise;Patient/family education;Manual techniques;Manual lymph drainage;Scar mobilization;Passive range of motion;Taping;Joint Manipulations    PT Next Visit Plan  make chip pack for lateral trunk/inferior axillary swelling, begin PROM to bilateral shoulders to pt's tolerance, AAROM exercises, cane exercise    PT Home Exercise Plan  post op breast exercises    Consulted and Agree with Plan of Care  Patient       Patient will benefit from skilled therapeutic intervention in order to improve the following deficits and impairments:  Decreased knowledge of precautions, Decreased range of motion, Decreased scar mobility, Increased edema, Decreased strength, Increased fascial restricitons, Pain, Postural dysfunction  Visit Diagnosis: 1. Stiffness of right shoulder, not elsewhere classified   2. Stiffness of left shoulder, not elsewhere classified   3. Acute pain of right shoulder   4. Acute pain of left shoulder   5. Muscle weakness (generalized)   6. Aftercare following surgery for neoplasm   7. Abnormal posture        Problem List Patient Active Problem List   Diagnosis Date Noted  . Breast cancer, right ([place]) 09/16/2018  . Genetic testing 09/09/2018  . Malignant neoplasm of upper-inner quadrant  of right breast in female, estrogen receptor positive ([place]) 08/19/2018  . Family history of breast cancer   . History of penicillin allergy 09/24/2012  . Exposure to strep throat 09/24/2012  . Perioral dermatitis 09/24/2012  . Left otitis media with effusion 08/16/2011  . Hx of antibiotic allergy 05/03/2011  . Unspecified disorder of the teeth and supporting structures 03/30/2008  . ALLERGIC RHINITIS 02/17/2008  . ENDOMETRIOSIS 02/17/2008  . HEADACHE 02/17/2008  . Hx gestational diabetes 02/17/2008  . POSITIVE PPD 12/17/2007    Allyson Sabal [facility] 10/10/2018, 11:28 AM  [place], [place], [address] Phone: [phone]   Fax:  [phone]  Name: Megan Armstrong MRN: [id number] Date of Birth: 05-Sep-1965  Manus Gunning, PT 10/10/18 11:28 AM

## 2018-10-14 ENCOUNTER — Other Ambulatory Visit: Payer: Self-pay | Admitting: Hematology and Oncology

## 2018-10-14 ENCOUNTER — Encounter: Payer: Self-pay | Admitting: *Deleted

## 2018-10-15 ENCOUNTER — Encounter: Payer: Self-pay | Admitting: Physical Therapy

## 2018-10-15 ENCOUNTER — Ambulatory Visit: Payer: Managed Care, Other (non HMO) | Admitting: Physical Therapy

## 2018-10-15 ENCOUNTER — Other Ambulatory Visit: Payer: Self-pay

## 2018-10-15 DIAGNOSIS — Z483 Aftercare following surgery for neoplasm: Secondary | ICD-10-CM

## 2018-10-15 DIAGNOSIS — M25611 Stiffness of right shoulder, not elsewhere classified: Secondary | ICD-10-CM | POA: Diagnosis not present

## 2018-10-15 DIAGNOSIS — M25612 Stiffness of left shoulder, not elsewhere classified: Secondary | ICD-10-CM

## 2018-10-15 DIAGNOSIS — M25512 Pain in left shoulder: Secondary | ICD-10-CM

## 2018-10-15 DIAGNOSIS — M25511 Pain in right shoulder: Secondary | ICD-10-CM

## 2018-10-15 NOTE — Patient Instructions (Signed)
Shoulder: Flexion (Supine)    With hands shoulder width apart, slowly lower dowel to floor behind head. Do not let elbows bend. Keep back flat. Hold 5-15____ seconds. Repeat _10___ times. Do __2__ sessions per day. CAUTION: Stretch slowly and gently.  Copyright  VHI. All rights reserved.  Shoulder: Abduction (Supine)    With right arm flat on floor, hold dowel in palm. Slowly move arm up to side of head by pushing with opposite arm. Do not let elbow bend. Repeat on opposite side.. Hold 5-15____ seconds. Repeat __10__ times. Do _2___ sessions per day. CAUTION: Stretch slowly and gently.  Copyright  VHI. All rights reserved.   

## 2018-10-15 NOTE — Therapy (Signed)
[name], [place], [address] Phone: [phone]   Fax:  [phone]  Physical Therapy Treatment  Patient Details  Name: Megan Armstrong MRN: [id number] Date of Birth: [birth year] Referring Provider (PT): Ave Filter Date: 10/15/2018  PT End of Session - 10/15/18 1109    Visit Number  2    Number of Visits  9    Date for PT Re-Evaluation  11/07/18    PT Start Time  1030    PT Stop Time  1114    PT Time Calculation (min)  44 min    Activity Tolerance  Patient tolerated treatment well    Behavior During Therapy  [facility] for tasks assessed/performed       Past Medical History:  Diagnosis Date  . Allergic rhinitis   . Endometrial hyperplasia   . Endometriosis   . Family history of breast cancer   . Family history of breast cancer   . Hx of endometriosis    rectal vag with laser surgery   . Seasonal allergies   . 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 DIAGNOSTIC LAPAROSCOPY    . MASTECTOMY W/ SENTINEL NODE BIOPSY Bilateral 09/16/2018   Procedure: RIGHT MASTECTOMY WITH RIGHT AXILLARY SENTINEL LYMPH NODE BIOPSY AND LEFT RISK REDUCING MASTECTOMY;  Surgeon: Rolm Bookbinder, MD;  Location: [place];  Service: General;  Laterality: Bilateral;    There were no vitals filed for this visit.  Subjective Assessment - 10/15/18 1037    Subjective  I have been doing my exercises consistently. I am still having that tightness across my chest.    Pertinent History  R breast cancer with bilateral mastectomy on 09/16/18 and SLNB (2 nodes both negative), does not need chemo or radiation, endometriosis    Patient Stated Goals  to resume normal functions, drive again, typing, get ROM back    Currently in Pain?  Yes    Pain Score  4     Pain Location  Chest    Pain Orientation   Right;Left    Pain Descriptors / Indicators  Squeezing    Pain Type  Surgical pain                       [facility] Adult PT Treatment/Exercise - 10/15/18 0001      Shoulder Exercises: Supine   Flexion  AAROM;Both;10 reps   with 5 sec holds using dowel, pt returned therapist demo   ABduction  AAROM;Both;10 reps   with 5 sec holds using dowel, pt returned therapist demo     Manual Therapy   Manual Therapy  Passive ROM    Passive ROM  to bilateral shoulders in direction of flexion and abduction to pt's tolerance, pt's PROM increased greatly from beginning of session to end of session                  PT Long Term Goals - 10/10/18 1124      PT LONG TERM GOAL #1   Title  Pt will demonstrate 160 degrees of bilateral shoulder flexion to allow her to reach overhead.    Baseline  R 115, L 105    Time  4    Period  Weeks    Status  New    Target Date  11/07/18  PT LONG TERM GOAL #2   Title  Pt will demonstrate 160 degrees of bilateral shoulder abduction to allow her to reach out to the side.    Baseline  R 91, L 77    Time  4    Period  Weeks    Status  New    Target Date  11/07/18      PT LONG TERM GOAL #3   Title  Pt will be independent in a home exercise program for continued strengthening and stretching    Time  4    Period  Weeks    Status  New    Target Date  11/07/18      PT LONG TERM GOAL #4   Title  Pt will report a 75% improvement in pain across chest to allow her to return to prior level of function.    Time  4    Period  Weeks    Status  New    Target Date  11/07/18      PT LONG TERM GOAL #5   Title  Pt will report a 75% improvement in swelling in bilateral lateral trunk and inferior axilla to allow improved comfort.    Time  4    Period  Weeks    Status  New    Target Date  11/07/18            Plan - 10/15/18 1109    Clinical Impression Statement  Began PROM to bilateral shoulders today. Pt was very tight at beginning of  session and limited by discomfort but by the end of the session she demonstrated greatly improved ROM. Pt reported she could feel her shoulder release and reported feeling much better by end of session.    Stability/Clinical Decision Making  Evolving/Moderate complexity    Rehab Potential  Excellent    PT Frequency  2x / week    PT Duration  4 weeks    PT Treatment/Interventions  ADLs/Self Care Home Management;Therapeutic activities;Therapeutic exercise;Patient/family education;Manual techniques;Manual lymph drainage;Scar mobilization;Passive range of motion;Taping;Joint Manipulations    PT Next Visit Plan  see how chip pack worked, continue PROM to bilateral shoulders to pt's tolerance, begin pulleys and ball,  AAROM exercises,assess indep with cane exercises    PT Home Exercise Plan  post op breast exercises, supine cane exercises    Consulted and Agree with Plan of Care  Patient       Patient will benefit from skilled therapeutic intervention in order to improve the following deficits and impairments:  Decreased knowledge of precautions, Decreased range of motion, Decreased scar mobility, Increased edema, Decreased strength, Increased fascial restricitons, Pain, Postural dysfunction  Visit Diagnosis: 1. Stiffness of right shoulder, not elsewhere classified   2. Stiffness of left shoulder, not elsewhere classified   3. Acute pain of right shoulder   4. Acute pain of left shoulder   5. Aftercare following surgery for neoplasm        Problem List Patient Active Problem List   Diagnosis Date Noted  . Breast cancer, right ([place]) 09/16/2018  . Genetic testing 09/09/2018  . Malignant neoplasm of upper-inner quadrant of right breast in female, estrogen receptor positive ([place]) 08/19/2018  . Family history of breast cancer   . History of penicillin allergy 09/24/2012  . Exposure to strep throat 09/24/2012  . Perioral dermatitis 09/24/2012  . Left otitis media with effusion 08/16/2011  .  Hx of antibiotic allergy 05/03/2011  . Unspecified disorder of the teeth  and supporting structures 03/30/2008  . ALLERGIC RHINITIS 02/17/2008  . ENDOMETRIOSIS 02/17/2008  . HEADACHE 02/17/2008  . Hx gestational diabetes 02/17/2008  . POSITIVE PPD 12/17/2007    Allyson Sabal [facility] 10/15/2018, 11:18 AM  [place], [place], [address] Phone: [phone]   Fax:  [phone]  Name: Megan Armstrong MRN: [id number] Date of Birth: 04-25-1965  Manus Gunning, PT 10/15/18 11:18 AM

## 2018-10-18 ENCOUNTER — Encounter: Payer: Self-pay | Admitting: Physical Therapy

## 2018-10-18 ENCOUNTER — Other Ambulatory Visit: Payer: Self-pay

## 2018-10-18 ENCOUNTER — Ambulatory Visit: Payer: Managed Care, Other (non HMO) | Admitting: Physical Therapy

## 2018-10-18 DIAGNOSIS — M25611 Stiffness of right shoulder, not elsewhere classified: Secondary | ICD-10-CM

## 2018-10-18 DIAGNOSIS — M25511 Pain in right shoulder: Secondary | ICD-10-CM

## 2018-10-18 DIAGNOSIS — M25512 Pain in left shoulder: Secondary | ICD-10-CM

## 2018-10-18 DIAGNOSIS — M6281 Muscle weakness (generalized): Secondary | ICD-10-CM

## 2018-10-18 DIAGNOSIS — M25612 Stiffness of left shoulder, not elsewhere classified: Secondary | ICD-10-CM

## 2018-10-18 NOTE — Therapy (Signed)
[place], [place], [address] Phone: [phone]   Fax:  [phone]  Physical Therapy Treatment  Patient Details  Name: Megan Armstrong MRN: [id number] Date of Birth: 28-Aug-1965 Referring Provider (PT): Ave Filter Date: 10/18/2018  PT End of Session - 10/18/18 0915    Visit Number  3    Number of Visits  9    Date for PT Re-Evaluation  11/07/18    PT Start Time  0832    PT Stop Time  0918    PT Time Calculation (min)  46 min    Activity Tolerance  Patient tolerated treatment well    Behavior During Therapy  [facility] for tasks assessed/performed       Past Medical History:  Diagnosis Date  . Allergic rhinitis   . Endometrial hyperplasia   . Endometriosis   . Family history of breast cancer   . Family history of breast cancer   . Hx of endometriosis    rectal vag with laser surgery   . Seasonal allergies   . 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 DIAGNOSTIC LAPAROSCOPY    . MASTECTOMY W/ SENTINEL NODE BIOPSY Bilateral 09/16/2018   Procedure: RIGHT MASTECTOMY WITH RIGHT AXILLARY SENTINEL LYMPH NODE BIOPSY AND LEFT RISK REDUCING MASTECTOMY;  Surgeon: Rolm Bookbinder, MD;  Location: [place];  Service: General;  Laterality: Bilateral;    There were no vitals filed for this visit.  Subjective Assessment - 10/18/18 0833    Subjective  I had some discomfort after last time but it felt like relief more than discomfort. I was able to go 12 hours without any pain killers.    Pertinent History  R breast cancer with bilateral mastectomy on 09/16/18 and SLNB (2 nodes both negative), does not need chemo or radiation, endometriosis    Patient Stated Goals  to resume normal functions, drive again, typing, get ROM back    Currently in Pain?  Yes    Pain Score  3     Pain  Location  Chest    Pain Orientation  Right;Left    Pain Descriptors / Indicators  Pressure    Pain Type  Surgical pain                       [facility] Adult PT Treatment/Exercise - 10/18/18 0001      Shoulder Exercises: Supine   Horizontal ABduction  Strengthening;Both;10 reps;Theraband   pt returned therapist demo   Theraband Level (Shoulder Horizontal ABduction)  Level 1 (Yellow)    External Rotation  Strengthening;Both;10 reps;Theraband   pt returned therapist demo   Theraband Level (Shoulder External Rotation)  Level 1 (Yellow)    Flexion  Right;Both;10 reps;Theraband   narrow and wide grip, pt returned therapist demo   Theraband Level (Shoulder Flexion)  Level 1 (Yellow)    Diagonals  Strengthening;Both;10 reps;Theraband   pt returned therapist demo   Theraband Level (Shoulder Diagonals)  Level 1 (Yellow)      Shoulder Exercises: Pulleys   Flexion  2 minutes   pt returned therapist demo   ABduction  2 minutes   pt returned therapist demo     Shoulder Exercises: Therapy Ball   Flexion  Both;10 reps   pt returned therapist demo   ABduction  Both;10 reps  pt returned therapist demo     Manual Therapy   Manual Therapy  Passive ROM    Passive ROM  to bilateral shoulders in direction of flexion and abduction to pt's tolerance, pt's PROM increased greatly from beginning of session to end of session                  PT Long Term Goals - 10/10/18 1124      PT LONG TERM GOAL #1   Title  Pt will demonstrate 160 degrees of bilateral shoulder flexion to allow her to reach overhead.    Baseline  R 115, L 105    Time  4    Period  Weeks    Status  New    Target Date  11/07/18      PT LONG TERM GOAL #2   Title  Pt will demonstrate 160 degrees of bilateral shoulder abduction to allow her to reach out to the side.    Baseline  R 91, L 77    Time  4    Period  Weeks    Status  New    Target Date  11/07/18      PT LONG TERM GOAL #3   Title  Pt will  be independent in a home exercise program for continued strengthening and stretching    Time  4    Period  Weeks    Status  New    Target Date  11/07/18      PT LONG TERM GOAL #4   Title  Pt will report a 75% improvement in pain across chest to allow her to return to prior level of function.    Time  4    Period  Weeks    Status  New    Target Date  11/07/18      PT LONG TERM GOAL #5   Title  Pt will report a 75% improvement in swelling in bilateral lateral trunk and inferior axilla to allow improved comfort.    Time  4    Period  Weeks    Status  New    Target Date  11/07/18            Plan - 10/18/18 0919    Clinical Impression Statement  Pt is progressing with her AROM. Added new AAROM exercises today and pt felt a good stretch will all exercises. Educated pt in supine scapular strengthening series and issued these as part of an HEP. Pt reports her axillary swelling went away with use of chip pack.    Rehab Potential  Excellent    PT Frequency  2x / week    PT Duration  4 weeks    PT Treatment/Interventions  ADLs/Self Care Home Management;Therapeutic activities;Therapeutic exercise;Patient/family education;Manual techniques;Manual lymph drainage;Scar mobilization;Passive range of motion;Taping;Joint Manipulations    PT Next Visit Plan  assess indep with supine scap,, continue PROM to bilateral shoulders to pt's tolerance, begin pulleys and ball,  AAROM exercises,assess indep with cane exercises    PT Home Exercise Plan  post op breast exercises, supine cane exercises    Consulted and Agree with Plan of Care  Patient       Patient will benefit from skilled therapeutic intervention in order to improve the following deficits and impairments:  Decreased knowledge of precautions, Decreased range of motion, Decreased scar mobility, Increased edema, Decreased strength, Increased fascial restricitons, Pain, Postural dysfunction  Visit Diagnosis: 1. Stiffness of right shoulder,  not elsewhere classified  2. Stiffness of left shoulder, not elsewhere classified   3. Acute pain of right shoulder   4. Acute pain of left shoulder   5. Muscle weakness (generalized)        Problem List Patient Active Problem List   Diagnosis Date Noted  . Breast cancer, right ([place]) 09/16/2018  . Genetic testing 09/09/2018  . Malignant neoplasm of upper-inner quadrant of right breast in female, estrogen receptor positive ([place]) 08/19/2018  . Family history of breast cancer   . History of penicillin allergy 09/24/2012  . Exposure to strep throat 09/24/2012  . Perioral dermatitis 09/24/2012  . Left otitis media with effusion 08/16/2011  . Hx of antibiotic allergy 05/03/2011  . Unspecified disorder of the teeth and supporting structures 03/30/2008  . ALLERGIC RHINITIS 02/17/2008  . ENDOMETRIOSIS 02/17/2008  . HEADACHE 02/17/2008  . Hx gestational diabetes 02/17/2008  . POSITIVE PPD 12/17/2007    Allyson Sabal [facility] 10/18/2018, [place], [place], [address] Phone: [phone]   Fax:  [phone]  Name: Orpah Hausner MRN: [id number] Date of Birth: 10-24-1965  Manus Gunning, PT 10/18/18 9:23 AM

## 2018-10-18 NOTE — Patient Instructions (Signed)
Over Head Pull: Narrow and Wide Grip   Cancer Rehab [phone]   On back, knees bent, feet flat, band across thighs, elbows straight but relaxed. Pull hands apart (start). Keeping elbows straight, bring arms up and over head, hands toward floor. Keep pull steady on band. Hold momentarily. Return slowly, keeping pull steady, back to start. Then do same with a wider grip on the band (past shoulder width) Repeat _10__ times. Band color __yellow____   Side Pull: Double Arm   On back, knees bent, feet flat. Arms perpendicular to body, shoulder level, elbows straight but relaxed. Pull arms out to sides, elbows straight. Resistance band comes across collarbones, hands toward floor. Hold momentarily. Slowly return to starting position. Repeat _10__ times. Band color _yellow____   Sword   On back, knees bent, feet flat, left hand on left hip, right hand above left. Pull right arm DIAGONALLY (hip to shoulder) across chest. Bring right arm along head toward floor. Hold momentarily. Slowly return to starting position. Repeat _10__ times. Do with left arm. Band color _yellow_____   Shoulder Rotation: Double Arm   On back, knees bent, feet flat, elbows tucked at sides, bent 90, hands palms up. Pull hands apart and down toward floor, keeping elbows near sides. Hold momentarily. Slowly return to starting position. Repeat _10__ times. Band color __yellow____   Orpah Melter

## 2018-10-21 ENCOUNTER — Ambulatory Visit: Payer: Managed Care, Other (non HMO)

## 2018-10-21 ENCOUNTER — Other Ambulatory Visit: Payer: Self-pay

## 2018-10-21 DIAGNOSIS — M25611 Stiffness of right shoulder, not elsewhere classified: Secondary | ICD-10-CM

## 2018-10-21 DIAGNOSIS — M6281 Muscle weakness (generalized): Secondary | ICD-10-CM

## 2018-10-21 DIAGNOSIS — M25512 Pain in left shoulder: Secondary | ICD-10-CM

## 2018-10-21 DIAGNOSIS — M25612 Stiffness of left shoulder, not elsewhere classified: Secondary | ICD-10-CM

## 2018-10-21 DIAGNOSIS — Z483 Aftercare following surgery for neoplasm: Secondary | ICD-10-CM

## 2018-10-21 DIAGNOSIS — R293 Abnormal posture: Secondary | ICD-10-CM

## 2018-10-21 DIAGNOSIS — M25511 Pain in right shoulder: Secondary | ICD-10-CM

## 2018-10-21 NOTE — Therapy (Signed)
[place], [place], [address] Phone: [phone]   Fax:  [phone]  Physical Therapy Treatment  Patient Details  Name: Megan Armstrong MRN: [id number] Date of Birth: 22-Jul-1965 Referring Provider (PT): Donne Hazel   Encounter Date: 10/21/2018  PT End of Session - 10/21/18 1155    Visit Number  4    Number of Visits  9    Date for PT Re-Evaluation  11/07/18    PT Start Time  1104    PT Stop Time  1149    PT Time Calculation (min)  45 min    Activity Tolerance  Patient tolerated treatment well    Behavior During Therapy  [facility] for tasks assessed/performed       Past Medical History:  Diagnosis Date  . Allergic rhinitis   . Endometrial hyperplasia   . Endometriosis   . Family history of breast cancer   . Family history of breast cancer   . Hx of endometriosis    rectal vag with laser surgery   . Seasonal allergies   . 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 DIAGNOSTIC LAPAROSCOPY    . MASTECTOMY W/ SENTINEL NODE BIOPSY Bilateral 09/16/2018   Procedure: RIGHT MASTECTOMY WITH RIGHT AXILLARY SENTINEL LYMPH NODE BIOPSY AND LEFT RISK REDUCING MASTECTOMY;  Surgeon: Rolm Bookbinder, MD;  Location: [place];  Service: General;  Laterality: Bilateral;    There were no vitals filed for this visit.  Subjective Assessment - 10/21/18 1109    Subjective  I feel good after each session. I was able to water my garden this morning which felt good. I didn't take my pain meds this morning to see how I do.    Pertinent History  R breast cancer with bilateral mastectomy on 09/16/18 and SLNB (2 nodes both negative), does not need chemo or radiation, endometriosis    Patient Stated Goals  to resume normal functions, drive again, typing, get ROM back    Currently in Pain?  No/denies                        [facility] Adult PT Treatment/Exercise - 10/21/18 0001      Manual Therapy   Manual Therapy  Myofascial release;Passive ROM    Myofascial Release  To bil axillae during P/ROM being mindful of healing incisions    Passive ROM  to bilateral shoulders in direction of flexion and abduction to pt's tolerance, pt's PROM increased greatly from beginning of session to end of session                  PT Long Term Goals - 10/10/18 1124      PT LONG TERM GOAL #1   Title  Pt will demonstrate 160 degrees of bilateral shoulder flexion to allow her to reach overhead.    Baseline  R 115, L 105    Time  4    Period  Weeks    Status  New    Target Date  11/07/18      PT LONG TERM GOAL #2   Title  Pt will demonstrate 160 degrees of bilateral shoulder abduction to allow her to reach out to the side.    Baseline  R 91, L 77    Time  4    Period  Weeks  Status  New    Target Date  11/07/18      PT LONG TERM GOAL #3   Title  Pt will be independent in a home exercise program for continued strengthening and stretching    Time  4    Period  Weeks    Status  New    Target Date  11/07/18      PT LONG TERM GOAL #4   Title  Pt will report a 75% improvement in pain across chest to allow her to return to prior level of function.    Time  4    Period  Weeks    Status  New    Target Date  11/07/18      PT LONG TERM GOAL #5   Title  Pt will report a 75% improvement in swelling in bilateral lateral trunk and inferior axilla to allow improved comfort.    Time  4    Period  Weeks    Status  New    Target Date  11/07/18            Plan - 10/21/18 1105    Clinical Impression Statement  Pt continues to show good progress with tolerating increased ROM during stretching. Some cording palpated at both axillae today and educated pt about this. Lt side softened more than Rt during P/ROM and pt noted 1 pop during stretching as well today. Steristrips still  present at both incisions so was very mindful of not creating pull at incisions with P/ROM. Issued red theraband for pt to use with supine scapular series as she reports some motions are easier now, but cautioned her to wait at least until she was 6 weeks out, she verbalized understanding.    Personal Factors and Comorbidities  Time since onset of injury/illness/exacerbation   recent surgery   Examination-Participation Restrictions  Driving;Meal Prep;Yard Work;Cleaning;Laundry    Stability/Clinical Decision Making  Evolving/Moderate complexity    Rehab Potential  Excellent    PT Frequency  2x / week    PT Duration  4 weeks    PT Treatment/Interventions  ADLs/Self Care Home Management;Therapeutic activities;Therapeutic exercise;Patient/family education;Manual techniques;Manual lymph drainage;Scar mobilization;Passive range of motion;Taping;Joint Manipulations    PT Next Visit Plan  Continue PROM to bilateral shoulders to pt's tolerance, pulleys and ball,  AAROM exercises    PT Home Exercise Plan  post op breast exercises, supine cane exercises    Consulted and Agree with Plan of Care  Patient       Patient will benefit from skilled therapeutic intervention in order to improve the following deficits and impairments:  Decreased knowledge of precautions, Decreased range of motion, Decreased scar mobility, Increased edema, Decreased strength, Increased fascial restricitons, Pain, Postural dysfunction  Visit Diagnosis: 1. Stiffness of right shoulder, not elsewhere classified   2. Stiffness of left shoulder, not elsewhere classified   3. Acute pain of right shoulder   4. Acute pain of left shoulder   5. Muscle weakness (generalized)   6. Aftercare following surgery for neoplasm   7. Abnormal posture        Problem List Patient Active Problem List   Diagnosis Date Noted  . Breast cancer, right ([place]) 09/16/2018  . Genetic testing 09/09/2018  . Malignant neoplasm of upper-inner quadrant of  right breast in female, estrogen receptor positive ([place]) 08/19/2018  . Family history of breast cancer   . History of penicillin allergy 09/24/2012  . Exposure to strep throat 09/24/2012  . Perioral dermatitis  09/24/2012  . Left otitis media with effusion 08/16/2011  . Hx of antibiotic allergy 05/03/2011  . Unspecified disorder of the teeth and supporting structures 03/30/2008  . ALLERGIC RHINITIS 02/17/2008  . ENDOMETRIOSIS 02/17/2008  . HEADACHE 02/17/2008  . Hx gestational diabetes 02/17/2008  . POSITIVE PPD 12/17/2007    Otelia Limes, PTA 10/21/2018, 12:09 PM  [place], [place], [address] Phone: [phone]   Fax:  [phone]  Name: Megan Armstrong MRN: [id number] Date of Birth: [birth year]

## 2018-10-22 ENCOUNTER — Encounter: Payer: Managed Care, Other (non HMO) | Admitting: Physical Therapy

## 2018-10-24 ENCOUNTER — Other Ambulatory Visit: Payer: Self-pay

## 2018-10-24 ENCOUNTER — Ambulatory Visit: Payer: Managed Care, Other (non HMO) | Admitting: Physical Therapy

## 2018-10-24 DIAGNOSIS — M25511 Pain in right shoulder: Secondary | ICD-10-CM

## 2018-10-24 DIAGNOSIS — M6281 Muscle weakness (generalized): Secondary | ICD-10-CM

## 2018-10-24 DIAGNOSIS — M25611 Stiffness of right shoulder, not elsewhere classified: Secondary | ICD-10-CM | POA: Diagnosis not present

## 2018-10-24 DIAGNOSIS — R293 Abnormal posture: Secondary | ICD-10-CM

## 2018-10-24 DIAGNOSIS — M25612 Stiffness of left shoulder, not elsewhere classified: Secondary | ICD-10-CM

## 2018-10-24 DIAGNOSIS — M25512 Pain in left shoulder: Secondary | ICD-10-CM

## 2018-10-24 DIAGNOSIS — Z483 Aftercare following surgery for neoplasm: Secondary | ICD-10-CM

## 2018-10-24 NOTE — Patient Instructions (Signed)

## 2018-10-24 NOTE — Therapy (Signed)
[place], [place], [address] Phone: [phone]   Fax:  [phone]  Physical Therapy Treatment  Patient Details  Name: Megan Armstrong MRN: [id number] Date of Birth: 03-11-1966 Referring Provider (PT): Ave Filter Date: 10/24/2018  PT End of Session - 10/24/18 1720    Visit Number  5    Date for PT Re-Evaluation  11/07/18    PT Start Time  1000    PT Stop Time  1050    PT Time Calculation (min)  50 min    Activity Tolerance  Patient tolerated treatment well    Behavior During Therapy  [facility] for tasks assessed/performed       Past Medical History:  Diagnosis Date  . Allergic rhinitis   . Endometrial hyperplasia   . Endometriosis   . Family history of breast cancer   . Family history of breast cancer   . Hx of endometriosis    rectal vag with laser surgery   . Seasonal allergies   . 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 DIAGNOSTIC LAPAROSCOPY    . MASTECTOMY W/ SENTINEL NODE BIOPSY Bilateral 09/16/2018   Procedure: RIGHT MASTECTOMY WITH RIGHT AXILLARY SENTINEL LYMPH NODE BIOPSY AND LEFT RISK REDUCING MASTECTOMY;  Surgeon: Rolm Bookbinder, MD;  Location: [place];  Service: General;  Laterality: Bilateral;    There were no vitals filed for this visit.  Subjective Assessment - 10/24/18 1103    Subjective  Pt comes in  today stating that she woke up with increased pain in her back and down into the calf of her right leg that is worse in sitting and seems to be better when standing and walking    Pertinent History  R breast cancer with bilateral mastectomy on 09/16/18 and SLNB (2 nodes both negative), does not need chemo or radiation, endometriosis         [facility] PT Assessment - 10/24/18 0001      Posture/Postural Control   Posture/Postural Control  Postural  limitations    Postural Limitations  Decreased lumbar lordosis      ROM / Strength   AROM / PROM / Strength  AROM      AROM   Overall AROM Comments  very tight lumbar spine with limited flexion extension , rotation or sidebending,    AROM Assessment Site  Lumbar                   [facility] Adult PT Treatment/Exercise - 10/24/18 0001      Exercises   Exercises  Shoulder;Lumbar      Lumbar Exercises: Supine   Pelvic Tilt  10 reps    Pelvic Tilt Limitations  gentle lumbar ROM for ant/past/ lateral tilts       Shoulder Exercises: Supine   Other Supine Exercises  Meeks decompression       Modalities   Modalities  Moist Heat      Moist Heat Therapy   Number Minutes Moist Heat  10 Minutes    Moist Heat Location  Lumbar Spine      Manual Therapy   Manual Therapy  Soft tissue mobilization;Myofascial release;Passive ROM    Manual therapy comments  worked on shoulders, anterior and lateral chest on both sides wiith pt in sidleying, more time spent on right side today.  provided thick  foam for pt to wear inside bra at both sides of chest above incision.     Soft tissue mobilization  briefly , with thick massage cream to tight lumbar paraspinals     Myofascial Release  To bil axillae during P/ROM being mindful of healing incisions    Passive ROM  to bilateral shoulders in direction of flexion and abduction to pt's tolerance, pt's PROM increased greatly from beginning of session to end of session                  PT Long Term Goals - 10/10/18 1124      PT LONG TERM GOAL #1   Title  Pt will demonstrate 160 degrees of bilateral shoulder flexion to allow her to reach overhead.    Baseline  R 115, L 105    Time  4    Period  Weeks    Status  New    Target Date  11/07/18      PT LONG TERM GOAL #2   Title  Pt will demonstrate 160 degrees of bilateral shoulder abduction to allow her to reach out to the side.    Baseline  R 91, L 77    Time  4    Period  Weeks     Status  New    Target Date  11/07/18      PT LONG TERM GOAL #3   Title  Pt will be independent in a home exercise program for continued strengthening and stretching    Time  4    Period  Weeks    Status  New    Target Date  11/07/18      PT LONG TERM GOAL #4   Title  Pt will report a 75% improvement in pain across chest to allow her to return to prior level of function.    Time  4    Period  Weeks    Status  New    Target Date  11/07/18      PT LONG TERM GOAL #5   Title  Pt will report a 75% improvement in swelling in bilateral lateral trunk and inferior axilla to allow improved comfort.    Time  4    Period  Weeks    Status  New    Target Date  11/07/18            Plan - 10/24/18 1721    Clinical Impression Statement  Pt very uncomfortable today due to new onset of pain that went from back down to right calf. She received moist heat, soft tissue work and gently ROM to lumbar spine with decrease in symptoms.  Suggested that she use a lumbar roll in sitting and walk frequently.  Also provided manual work to upper body with stretching, myofasical release and MLD to fullness in anterior chest above incision with addition of foam patches to try.  Pt felt better at end of session and was appreciative of treatment    Rehab Potential  Excellent    PT Frequency  2x / week    PT Duration  4 weeks    PT Treatment/Interventions  ADLs/Self Care Home Management;Therapeutic activities;Therapeutic exercise;Patient/family education;Manual techniques;Manual lymph drainage;Scar mobilization;Passive range of motion;Taping;Joint Manipulations    PT Next Visit Plan  Check to see if foam helped anterior chest swelling, Check how back is doing and continue with moist heat, soft tissue work , mobility and core stregnthening with Continue PROM to bilateral shoulders  to pt's tolerance, pulleys and ball,  AAROM exercises       Patient will benefit from skilled therapeutic intervention in order to  improve the following deficits and impairments:  Decreased knowledge of precautions, Decreased range of motion, Decreased scar mobility, Increased edema, Decreased strength, Increased fascial restricitons, Pain, Postural dysfunction  Visit Diagnosis: 1. Stiffness of right shoulder, not elsewhere classified   2. Stiffness of left shoulder, not elsewhere classified   3. Acute pain of right shoulder   4. Acute pain of left shoulder   5. Muscle weakness (generalized)   6. Aftercare following surgery for neoplasm   7. Abnormal posture        Problem List Patient Active Problem List   Diagnosis Date Noted  . Breast cancer, right ([place]) 09/16/2018  . Genetic testing 09/09/2018  . Malignant neoplasm of upper-inner quadrant of right breast in female, estrogen receptor positive ([place]) 08/19/2018  . Family history of breast cancer   . History of penicillin allergy 09/24/2012  . Exposure to strep throat 09/24/2012  . Perioral dermatitis 09/24/2012  . Left otitis media with effusion 08/16/2011  . Hx of antibiotic allergy 05/03/2011  . Unspecified disorder of the teeth and supporting structures 03/30/2008  . ALLERGIC RHINITIS 02/17/2008  . ENDOMETRIOSIS 02/17/2008  . HEADACHE 02/17/2008  . Hx gestational diabetes 02/17/2008  . POSITIVE PPD 12/17/2007   Donato Heinz. Owens Shark PT  [facility] 10/24/2018, [place], [place], [address] Phone: [phone]   Fax:  [phone]  Name: Megan Armstrong MRN: [id number] Date of Birth: Jun 21, 1965

## 2018-10-29 ENCOUNTER — Ambulatory Visit: Payer: Managed Care, Other (non HMO) | Admitting: Physical Therapy

## 2018-10-29 ENCOUNTER — Encounter: Payer: Self-pay | Admitting: Physical Therapy

## 2018-10-29 ENCOUNTER — Other Ambulatory Visit: Payer: Self-pay

## 2018-10-29 DIAGNOSIS — M25512 Pain in left shoulder: Secondary | ICD-10-CM

## 2018-10-29 DIAGNOSIS — M25611 Stiffness of right shoulder, not elsewhere classified: Secondary | ICD-10-CM | POA: Diagnosis not present

## 2018-10-29 DIAGNOSIS — Z483 Aftercare following surgery for neoplasm: Secondary | ICD-10-CM

## 2018-10-29 DIAGNOSIS — M25511 Pain in right shoulder: Secondary | ICD-10-CM

## 2018-10-29 DIAGNOSIS — M25612 Stiffness of left shoulder, not elsewhere classified: Secondary | ICD-10-CM

## 2018-10-29 NOTE — Therapy (Signed)
[place], [place], [address] Phone: [phone]   Fax:  [phone]  Physical Therapy Treatment  Patient Details  Name: Megan Armstrong MRN: [id number] Date of Birth: [birth year] Referring Provider (PT): Ave Filter Date: 10/29/2018  PT End of Session - 10/29/18 1221    Visit Number  6    Number of Visits  9    Date for PT Re-Evaluation  11/07/18    PT Start Time  5643    PT Stop Time  1220    PT Time Calculation (min)  44 min    Activity Tolerance  Patient tolerated treatment well    Behavior During Therapy  [facility] for tasks assessed/performed       Past Medical History:  Diagnosis Date  . Allergic rhinitis   . Endometrial hyperplasia   . Endometriosis   . Family history of breast cancer   . Family history of breast cancer   . Hx of endometriosis    rectal vag with laser surgery   . Seasonal allergies   . 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 DIAGNOSTIC LAPAROSCOPY    . MASTECTOMY W/ SENTINEL NODE BIOPSY Bilateral 09/16/2018   Procedure: RIGHT MASTECTOMY WITH RIGHT AXILLARY SENTINEL LYMPH NODE BIOPSY AND LEFT RISK REDUCING MASTECTOMY;  Surgeon: Rolm Bookbinder, MD;  Location: [facility];  Service: General;  Laterality: Bilateral;    There were no vitals filed for this visit.  Subjective Assessment - 10/29/18 1139    Subjective  I am still having the pain in my back going down my calf. I am in constant pain. I wore a pair of heels for my daughters graduation. I don't know if that is what did it.    Pertinent History  R breast cancer with bilateral mastectomy on 09/16/18 and SLNB (2 nodes both negative), does not need chemo or radiation, endometriosis    Patient Stated Goals  to resume normal functions, drive again, typing, get ROM back    Currently in Pain?  Yes     Pain Score  7     Pain Location  Hip    Pain Orientation  Right    Pain Descriptors / Indicators  Radiating    Pain Type  Acute pain    Pain Radiating Towards  down to calf    Pain Onset  In the past 7 days    Pain Frequency  Constant                       [facility] Adult PT Treatment/Exercise - 10/29/18 0001      Manual Therapy   Myofascial Release  To left axilla during P/ROM being mindful of healing incisions    Passive ROM  to bilateral shoulders with majority of time spent on left shoulder, in direction of flexion and abduction to pt's tolerance, pt's PROM increased greatly from beginning of session to end of session                  PT Long Term Goals - 10/10/18 1124      PT LONG TERM GOAL #1   Title  Pt will demonstrate 160 degrees of bilateral shoulder flexion to allow her to reach overhead.    Baseline  R 115, L 105    Time  4  Period  Weeks    Status  New    Target Date  11/07/18      PT LONG TERM GOAL #2   Title  Pt will demonstrate 160 degrees of bilateral shoulder abduction to allow her to reach out to the side.    Baseline  R 91, L 77    Time  4    Period  Weeks    Status  New    Target Date  11/07/18      PT LONG TERM GOAL #3   Title  Pt will be independent in a home exercise program for continued strengthening and stretching    Time  4    Period  Weeks    Status  New    Target Date  11/07/18      PT LONG TERM GOAL #4   Title  Pt will report a 75% improvement in pain across chest to allow her to return to prior level of function.    Time  4    Period  Weeks    Status  New    Target Date  11/07/18      PT LONG TERM GOAL #5   Title  Pt will report a 75% improvement in swelling in bilateral lateral trunk and inferior axilla to allow improved comfort.    Time  4    Period  Weeks    Status  New    Target Date  11/07/18            Plan - 10/29/18 1223    Clinical Impression Statement  Pt very tight across bilateral  pecs today with limited shoulder ROM. Left shoulder was more limited than right shoulder. Spent entire session on PROM of bilateral shoulders with majority of time focused on left and by end of session she had nearly full PROM. She is still having considerable sciatic pain and would like to address this in therapy. Will update POC at next session to address this.    Stability/Clinical Decision Making  Evolving/Moderate complexity    Rehab Potential  Excellent    PT Frequency  2x / week    PT Duration  4 weeks    PT Treatment/Interventions  ADLs/Self Care Home Management;Therapeutic activities;Therapeutic exercise;Patient/family education;Manual techniques;Manual lymph drainage;Scar mobilization;Passive range of motion;Taping;Joint Manipulations    PT Next Visit Plan  Check how back is doing and continue with moist heat, soft tissue work , mobility and core stregnthening with Continue PROM to bilateral shoulders to pt's tolerance, pulleys and ball,  AAROM exercises    PT Home Exercise Plan  post op breast exercises, supine cane exercises    Consulted and Agree with Plan of Care  Patient       Patient will benefit from skilled therapeutic intervention in order to improve the following deficits and impairments:     Visit Diagnosis: 1. Stiffness of right shoulder, not elsewhere classified   2. Stiffness of left shoulder, not elsewhere classified   3. Acute pain of right shoulder   4. Acute pain of left shoulder   5. Aftercare following surgery for neoplasm        Problem List Patient Active Problem List   Diagnosis Date Noted  . Breast cancer, right ([facility]) 09/16/2018  . Genetic testing 09/09/2018  . Malignant neoplasm of upper-inner quadrant of right breast in female, estrogen receptor positive ([place]) 08/19/2018  . Family history of breast cancer   . History of penicillin allergy 09/24/2012  . Exposure  to strep throat 09/24/2012  . Perioral dermatitis 09/24/2012  . Left otitis media with  effusion 08/16/2011  . Hx of antibiotic allergy 05/03/2011  . Unspecified disorder of the teeth and supporting structures 03/30/2008  . ALLERGIC RHINITIS 02/17/2008  . ENDOMETRIOSIS 02/17/2008  . HEADACHE 02/17/2008  . Hx gestational diabetes 02/17/2008  . POSITIVE PPD 12/17/2007    Allyson Sabal [facility] 10/29/2018, 12:26 PM  [place], [place], [address] Phone: [phone]   Fax:  [phone]  Name: Megan Armstrong MRN: [id number] Date of Birth: Dec 11, 1965  Manus Gunning, PT 10/29/18 12:26 PM

## 2018-10-30 ENCOUNTER — Telehealth: Payer: Self-pay | Admitting: Internal Medicine

## 2018-10-30 DIAGNOSIS — M5431 Sciatica, right side: Secondary | ICD-10-CM

## 2018-10-31 ENCOUNTER — Ambulatory Visit: Payer: Managed Care, Other (non HMO) | Admitting: Physical Therapy

## 2018-10-31 ENCOUNTER — Encounter: Payer: Self-pay | Admitting: Physical Therapy

## 2018-10-31 ENCOUNTER — Other Ambulatory Visit: Payer: Self-pay

## 2018-10-31 DIAGNOSIS — R293 Abnormal posture: Secondary | ICD-10-CM

## 2018-10-31 DIAGNOSIS — M25611 Stiffness of right shoulder, not elsewhere classified: Secondary | ICD-10-CM

## 2018-10-31 DIAGNOSIS — M25612 Stiffness of left shoulder, not elsewhere classified: Secondary | ICD-10-CM

## 2018-10-31 DIAGNOSIS — M25512 Pain in left shoulder: Secondary | ICD-10-CM

## 2018-10-31 DIAGNOSIS — M79604 Pain in right leg: Secondary | ICD-10-CM

## 2018-10-31 DIAGNOSIS — M25511 Pain in right shoulder: Secondary | ICD-10-CM

## 2018-10-31 DIAGNOSIS — M6281 Muscle weakness (generalized): Secondary | ICD-10-CM

## 2018-10-31 DIAGNOSIS — Z483 Aftercare following surgery for neoplasm: Secondary | ICD-10-CM

## 2018-10-31 NOTE — Therapy (Signed)
[place], [place], [address] Phone: [phone]   Fax:  [phone]  Physical Therapy Treatment  Patient Details  Name: Megan Armstrong MRN: [id number] Date of Birth: 10-Jun-1965 Referring Provider (PT): Ave Filter Date: 10/31/2018  PT End of Session - 10/31/18 1122    Visit Number  7    Number of Visits  17    Date for PT Re-Evaluation  11/28/18    PT Start Time  1275    PT Stop Time  1117    PT Time Calculation (min)  42 min    Activity Tolerance  Patient tolerated treatment well    Behavior During Therapy  [facility] for tasks assessed/performed       Past Medical History:  Diagnosis Date  . Allergic rhinitis   . Endometrial hyperplasia   . Endometriosis   . Family history of breast cancer   . Family history of breast cancer   . Hx of endometriosis    rectal vag with laser surgery   . Seasonal allergies   . 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 DIAGNOSTIC LAPAROSCOPY    . MASTECTOMY W/ SENTINEL NODE BIOPSY Bilateral 09/16/2018   Procedure: RIGHT MASTECTOMY WITH RIGHT AXILLARY SENTINEL LYMPH NODE BIOPSY AND LEFT RISK REDUCING MASTECTOMY;  Surgeon: Rolm Bookbinder, MD;  Location: [place];  Service: General;  Laterality: Bilateral;    There were no vitals filed for this visit.  Subjective Assessment - 10/31/18 1035    Subjective  The band exercise is really helping me loosen up. I have not been doing the stick exercise as much. I was more loose last time but it does tighten up. I saw the Dr yesterday about my sciatic pain. The new stretch is helping.    Pertinent History  R breast cancer with bilateral mastectomy on 09/16/18 and SLNB (2 nodes both negative), does not need chemo or radiation, endometriosis    Patient Stated Goals  to resume normal functions, drive  again, typing, get ROM back    Currently in Pain?  Yes    Pain Score  5     Pain Location  Hip    Pain Orientation  Right    Pain Descriptors / Indicators  Radiating    Pain Type  Acute pain    Pain Onset  In the past 7 days    Pain Frequency  Constant                       [facility] Adult PT Treatment/Exercise - 10/31/18 0001      Shoulder Exercises: Pulleys   Flexion  2 minutes   with v/c to stretch at top   ABduction  2 minutes   with v/c to stretch at top     Shoulder Exercises: Therapy Ball   Flexion  Both;10 reps   with stretch at top   ABduction  Both;10 reps   with stretch at top     Manual Therapy   Soft tissue mobilization  in left sidelying along entire R piriformis muscle, pt reported initial tenderness with relief at end of session                  PT Long Term Goals - 10/31/18 1049      PT LONG TERM GOAL #  1   Title  Pt will demonstrate 160 degrees of bilateral shoulder flexion to allow her to reach overhead.    Baseline  R 115, L 105, 10/31/18- R 160 L 156    Time  4    Period  Weeks    Status  Partially Met      PT LONG TERM GOAL #2   Title  Pt will demonstrate 160 degrees of bilateral shoulder abduction to allow her to reach out to the side.    Baseline  R 91, L 77, 10/31/18- R 175 L 180    Time  4    Period  Weeks    Status  Achieved      PT LONG TERM GOAL #3   Title  Pt will be independent in a home exercise program for continued strengthening and stretching    Time  4    Period  Weeks    Status  On-going      PT LONG TERM GOAL #4   Title  Pt will report a 75% improvement in pain across chest to allow her to return to prior level of function.    Baseline  10/31/18- 75% improvement    Time  4    Period  Weeks    Status  Achieved      PT LONG TERM GOAL #5   Title  Pt will report a 75% improvement in swelling in bilateral lateral trunk and inferior axilla to allow improved comfort.    Baseline  10/31/18- 60% improvement     Time  4    Period  Weeks    Status  On-going      Additional Long Term Goals   Additional Long Term Goals  Yes      PT LONG TERM GOAL #6   Title  Pt will experience no pain from sciatica in RLE during the day to allow pt to return to PLOF    Baseline  10/31/18- currently pain is between 5 and 6/10    Time  4    Period  Weeks    Status  New    Target Date  11/28/18            Plan - 10/31/18 1123    Clinical Impression Statement  Reassessed pt's progress towards goals. Pt has met her abduction ROM goal for therapy and has almost met her flexion ROM goal. Added new goal to address recent diagnosis of sciatica that causes R LE pain. Pt would benefit from continued skilled PT services to decrease R sciatic pain, improve bilateral shoulder ROM and decrease tightess as well as decrease lateral trunk swelling.    Stability/Clinical Decision Making  Evolving/Moderate complexity    Rehab Potential  Excellent    PT Frequency  2x / week    PT Duration  4 weeks    PT Treatment/Interventions  ADLs/Self Care Home Management;Therapeutic activities;Therapeutic exercise;Patient/family education;Manual techniques;Manual lymph drainage;Scar mobilization;Passive range of motion;Taping;Joint Manipulations    PT Next Visit Plan  continue piriformis massage with Continue PROM to bilateral shoulders to pt's tolerance, pulleys and ball,  AAROM exercises    PT Home Exercise Plan  post op breast exercises, supine cane exercises    Consulted and Agree with Plan of Care  Patient       Patient will benefit from skilled therapeutic intervention in order to improve the following deficits and impairments:  Decreased knowledge of precautions, Decreased range of motion, Decreased scar mobility, Increased edema, Decreased  strength, Increased fascial restricitons, Pain, Postural dysfunction  Visit Diagnosis: 1. Stiffness of right shoulder, not elsewhere classified   2. Stiffness of left shoulder, not elsewhere  classified   3. Pain in right leg   4. Acute pain of right shoulder   5. Acute pain of left shoulder   6. Aftercare following surgery for neoplasm   7. Muscle weakness (generalized)   8. Abnormal posture        Problem List Patient Active Problem List   Diagnosis Date Noted  . Breast cancer, right ([place]) 09/16/2018  . Genetic testing 09/09/2018  . Malignant neoplasm of upper-inner quadrant of right breast in female, estrogen receptor positive ([place]) 08/19/2018  . Family history of breast cancer   . History of penicillin allergy 09/24/2012  . Exposure to strep throat 09/24/2012  . Perioral dermatitis 09/24/2012  . Left otitis media with effusion 08/16/2011  . Hx of antibiotic allergy 05/03/2011  . Unspecified disorder of the teeth and supporting structures 03/30/2008  . ALLERGIC RHINITIS 02/17/2008  . ENDOMETRIOSIS 02/17/2008  . HEADACHE 02/17/2008  . Hx gestational diabetes 02/17/2008  . POSITIVE PPD 12/17/2007    Allyson Sabal [facility] 10/31/2018, 11:30 AM  [place], [place], [address] Phone: [phone]   Fax:  [phone]  Name: Megan Armstrong MRN: [id number] Date of Birth: Jun 16, 1965  Manus Gunning, PT 10/31/18 11:30 AM

## 2018-11-01 NOTE — Telephone Encounter (Signed)
patein noted acute  onset of lbp with sciatica type sx  And disc when she was here with daughter . Examined no alarm sx  She is under pt for s/p breast cancer surgery  And RX written for PT for lbp with sciatica

## 2018-11-05 ENCOUNTER — Encounter: Payer: Self-pay | Admitting: Physical Therapy

## 2018-11-05 ENCOUNTER — Ambulatory Visit: Payer: Managed Care, Other (non HMO) | Admitting: Physical Therapy

## 2018-11-05 ENCOUNTER — Other Ambulatory Visit: Payer: Self-pay

## 2018-11-05 DIAGNOSIS — M25612 Stiffness of left shoulder, not elsewhere classified: Secondary | ICD-10-CM

## 2018-11-05 DIAGNOSIS — Z483 Aftercare following surgery for neoplasm: Secondary | ICD-10-CM

## 2018-11-05 DIAGNOSIS — M25611 Stiffness of right shoulder, not elsewhere classified: Secondary | ICD-10-CM | POA: Diagnosis not present

## 2018-11-05 DIAGNOSIS — M25511 Pain in right shoulder: Secondary | ICD-10-CM

## 2018-11-05 NOTE — Therapy (Signed)
[place], [place], [address] Phone: [phone]   Fax:  [phone]  Physical Therapy Treatment  Patient Details  Name: Megan Armstrong MRN: [id number] Date of Birth: [birth year] Referring Provider (PT): Ave Filter Date: 11/05/2018  PT End of Session - 11/05/18 1222    Visit Number  8    Number of Visits  17    Date for PT Re-Evaluation  11/28/18    PT Start Time  1132    PT Stop Time  1215    PT Time Calculation (min)  43 min    Activity Tolerance  Patient tolerated treatment well    Behavior During Therapy  [facility] for tasks assessed/performed       Past Medical History:  Diagnosis Date  . Allergic rhinitis   . Endometrial hyperplasia   . Endometriosis   . Family history of breast cancer   . Family history of breast cancer   . Hx of endometriosis    rectal vag with laser surgery   . Seasonal allergies   . 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 DIAGNOSTIC LAPAROSCOPY    . MASTECTOMY W/ SENTINEL NODE BIOPSY Bilateral 09/16/2018   Procedure: RIGHT MASTECTOMY WITH RIGHT AXILLARY SENTINEL LYMPH NODE BIOPSY AND LEFT RISK REDUCING MASTECTOMY;  Surgeon: Rolm Bookbinder, MD;  Location: [place];  Service: General;  Laterality: Bilateral;    There were no vitals filed for this visit.  Subjective Assessment - 11/05/18 1133    Subjective  The sciatica pain has relieved greatly. The pain is just more localized now. The massage really helped.    Pertinent History  R breast cancer with bilateral mastectomy on 09/16/18 and SLNB (2 nodes both negative), does not need chemo or radiation, endometriosis    Patient Stated Goals  to resume normal functions, drive again, typing, get ROM back    Currently in Pain?  Yes    Pain Score  3     Pain Location  Hip    Pain Orientation   Right                       [facility] Adult PT Treatment/Exercise - 11/05/18 0001      Shoulder Exercises: Pulleys   Flexion  2 minutes   with v/c to stretch at top   ABduction  2 minutes   with v/c to stretch at top     Shoulder Exercises: Therapy Ball   Flexion  Both;10 reps   with stretch at top   ABduction  Both;10 reps   with stretch at top     Manual Therapy   Soft tissue mobilization  in left sidelying along entire R piriformis muscle, pt reported initial tenderness with relief at end of session    Passive ROM  to bilateral shoulders with majority of time spent on right shoulder, in direction of flexion and abduction to pt's tolerance, pt had significant tightness in bilateral pecs                  PT Long Term Goals - 10/31/18 1049      PT LONG TERM GOAL #1   Title  Pt will demonstrate 160 degrees of bilateral shoulder flexion to allow her to reach overhead.    Baseline  R 115, L 105,  10/31/18- R 160 L 156    Time  4    Period  Weeks    Status  Partially Met      PT LONG TERM GOAL #2   Title  Pt will demonstrate 160 degrees of bilateral shoulder abduction to allow her to reach out to the side.    Baseline  R 91, L 77, 10/31/18- R 175 L 180    Time  4    Period  Weeks    Status  Achieved      PT LONG TERM GOAL #3   Title  Pt will be independent in a home exercise program for continued strengthening and stretching    Time  4    Period  Weeks    Status  On-going      PT LONG TERM GOAL #4   Title  Pt will report a 75% improvement in pain across chest to allow her to return to prior level of function.    Baseline  10/31/18- 75% improvement    Time  4    Period  Weeks    Status  Achieved      PT LONG TERM GOAL #5   Title  Pt will report a 75% improvement in swelling in bilateral lateral trunk and inferior axilla to allow improved comfort.    Baseline  10/31/18- 60% improvement    Time  4    Period  Weeks    Status  On-going       Additional Long Term Goals   Additional Long Term Goals  Yes      PT LONG TERM GOAL #6   Title  Pt will experience no pain from sciatica in RLE during the day to allow pt to return to PLOF    Baseline  10/31/18- currently pain is between 5 and 6/10    Time  4    Period  Weeks    Status  New    Target Date  11/28/18            Plan - 11/05/18 1223    Clinical Impression Statement  Pt had significant reduction of pain in her right leg. It is now localized to piriformis muscle and is no longer radiating down her leg. Continued with soft tissue mobliization to piriformis muscle and then PROM to bilateral shoulders. Bilateral pecs were more tight today with right worse than left. Pt reports she has been having spasms across her chest.    Rehab Potential  Excellent    PT Frequency  2x / week    PT Duration  4 weeks    PT Treatment/Interventions  ADLs/Self Care Home Management;Therapeutic activities;Therapeutic exercise;Patient/family education;Manual techniques;Manual lymph drainage;Scar mobilization;Passive range of motion;Taping;Joint Manipulations    PT Next Visit Plan  continue piriformis massage with Continue PROM to bilateral shoulders to pt's tolerance, pulleys and ball,  AAROM exercises    PT Home Exercise Plan  post op breast exercises, supine cane exercises    Consulted and Agree with Plan of Care  Patient       Patient will benefit from skilled therapeutic intervention in order to improve the following deficits and impairments:  Decreased knowledge of precautions, Decreased range of motion, Decreased scar mobility, Increased edema, Decreased strength, Increased fascial restricitons, Pain, Postural dysfunction  Visit Diagnosis: 1. Acute pain of right shoulder   2. Stiffness of left shoulder, not elsewhere classified   3. Stiffness of right shoulder, not elsewhere classified   4. Aftercare following  surgery for neoplasm        Problem List Patient Active Problem List    Diagnosis Date Noted  . Breast cancer, right ([place]) 09/16/2018  . Genetic testing 09/09/2018  . Malignant neoplasm of upper-inner quadrant of right breast in female, estrogen receptor positive ([place]) 08/19/2018  . Family history of breast cancer   . History of penicillin allergy 09/24/2012  . Exposure to strep throat 09/24/2012  . Perioral dermatitis 09/24/2012  . Left otitis media with effusion 08/16/2011  . Hx of antibiotic allergy 05/03/2011  . Unspecified disorder of the teeth and supporting structures 03/30/2008  . ALLERGIC RHINITIS 02/17/2008  . ENDOMETRIOSIS 02/17/2008  . HEADACHE 02/17/2008  . Hx gestational diabetes 02/17/2008  . POSITIVE PPD 12/17/2007    Allyson Sabal [facility] 11/05/2018, 12:25 PM  [place], [place], [address] Phone: [phone]   Fax:  [phone]  Name: Rupinder Livingston MRN: [id number] Date of Birth: November 07, 1965  Manus Gunning, PT 11/05/18 12:25 PM

## 2018-11-07 ENCOUNTER — Other Ambulatory Visit: Payer: Self-pay

## 2018-11-07 ENCOUNTER — Encounter: Payer: Self-pay | Admitting: Physical Therapy

## 2018-11-07 ENCOUNTER — Ambulatory Visit: Payer: Managed Care, Other (non HMO) | Admitting: Physical Therapy

## 2018-11-07 DIAGNOSIS — Z483 Aftercare following surgery for neoplasm: Secondary | ICD-10-CM

## 2018-11-07 DIAGNOSIS — M25611 Stiffness of right shoulder, not elsewhere classified: Secondary | ICD-10-CM

## 2018-11-07 DIAGNOSIS — M25612 Stiffness of left shoulder, not elsewhere classified: Secondary | ICD-10-CM

## 2018-11-07 NOTE — Therapy (Signed)
[place], [place], [address] Phone: [phone]   Fax:  [phone]  Physical Therapy Treatment  Patient Details  Name: Megan Armstrong MRN: [id number] Date of Birth: Aug 21, 1965 Referring Provider (PT): Ave Filter Date: 11/07/2018  PT End of Session - 11/07/18 1227    Visit Number  9    Number of Visits  17    Date for PT Re-Evaluation  11/28/18    PT Start Time  3832    PT Stop Time  1217    PT Time Calculation (min)  43 min    Activity Tolerance  Patient tolerated treatment well    Behavior During Therapy  [facility] for tasks assessed/performed       Past Medical History:  Diagnosis Date  . Allergic rhinitis   . Endometrial hyperplasia   . Endometriosis   . Family history of breast cancer   . Family history of breast cancer   . Hx of endometriosis    rectal vag with laser surgery   . Seasonal allergies   . 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 DIAGNOSTIC LAPAROSCOPY    . MASTECTOMY W/ SENTINEL NODE BIOPSY Bilateral 09/16/2018   Procedure: RIGHT MASTECTOMY WITH RIGHT AXILLARY SENTINEL LYMPH NODE BIOPSY AND LEFT RISK REDUCING MASTECTOMY;  Surgeon: Rolm Bookbinder, MD;  Location: [place];  Service: General;  Laterality: Bilateral;    There were no vitals filed for this visit.  Subjective Assessment - 11/07/18 1134    Subjective  I am having a lot of discomfort across my chest. The leg pain is gone. I have a little discomfort in my just below my shoulder blade.    Pertinent History  R breast cancer with bilateral mastectomy on 09/16/18 and SLNB (2 nodes both negative), does not need chemo or radiation, endometriosis    Patient Stated Goals  to resume normal functions, drive again, typing, get ROM back    Currently in Pain?  Yes    Pain Score  6     Pain  Location  Chest    Pain Orientation  Right;Left    Pain Descriptors / Indicators  Tingling;Sharp;Squeezing    Pain Type  Acute pain                       [facility] Adult PT Treatment/Exercise - 11/07/18 0001      Manual Therapy   Soft tissue mobilization  with pt lying over towel roll throughout session to stretch pec: STM to bilateral pecs due to increased tightness using massage cream    Passive ROM  to bilateral shoulders with majority of time spent on right shoulder, in direction of flexion and abduction to pt's tolerance, pt had significant tightness in bilateral pecs                  PT Long Term Goals - 10/31/18 1049      PT LONG TERM GOAL #1   Title  Pt will demonstrate 160 degrees of bilateral shoulder flexion to allow her to reach overhead.    Baseline  R 115, L 105, 10/31/18- R 160 L 156    Time  4    Period  Weeks    Status  Partially Met      PT LONG TERM GOAL #2  Title  Pt will demonstrate 160 degrees of bilateral shoulder abduction to allow her to reach out to the side.    Baseline  R 91, L 77, 10/31/18- R 175 L 180    Time  4    Period  Weeks    Status  Achieved      PT LONG TERM GOAL #3   Title  Pt will be independent in a home exercise program for continued strengthening and stretching    Time  4    Period  Weeks    Status  On-going      PT LONG TERM GOAL #4   Title  Pt will report a 75% improvement in pain across chest to allow her to return to prior level of function.    Baseline  10/31/18- 75% improvement    Time  4    Period  Weeks    Status  Achieved      PT LONG TERM GOAL #5   Title  Pt will report a 75% improvement in swelling in bilateral lateral trunk and inferior axilla to allow improved comfort.    Baseline  10/31/18- 60% improvement    Time  4    Period  Weeks    Status  On-going      Additional Long Term Goals   Additional Long Term Goals  Yes      PT LONG TERM GOAL #6   Title  Pt will experience no pain from  sciatica in RLE during the day to allow pt to return to PLOF    Baseline  10/31/18- currently pain is between 5 and 6/10    Time  4    Period  Weeks    Status  New    Target Date  11/28/18            Plan - 11/07/18 1225    Clinical Impression Statement  Pt was having increased tightness and spasms across chest today. Performed soft tissue mobilization to bilateral pecs to help decrease tightness. Pt felt relief after this. Also had pt lie over towel roll for entire session and during PROM to help decrease chest tightness.    Rehab Potential  Excellent    PT Frequency  2x / week    PT Duration  4 weeks    PT Treatment/Interventions  ADLs/Self Care Home Management;Therapeutic activities;Therapeutic exercise;Patient/family education;Manual techniques;Manual lymph drainage;Scar mobilization;Passive range of motion;Taping;Joint Manipulations    PT Next Visit Plan  continue piriformis massage if needed, STM to bilateral pecs, scar massage once steri strips come off, with Continue PROM to bilateral shoulders to pt's tolerance, pulleys and ball,  AAROM exercises    PT Home Exercise Plan  post op breast exercises, supine cane exercises    Consulted and Agree with Plan of Care  Patient       Patient will benefit from skilled therapeutic intervention in order to improve the following deficits and impairments:  Decreased knowledge of precautions, Decreased range of motion, Decreased scar mobility, Increased edema, Decreased strength, Increased fascial restricitons, Pain, Postural dysfunction  Visit Diagnosis: 1. Aftercare following surgery for neoplasm   2. Stiffness of right shoulder, not elsewhere classified   3. Stiffness of left shoulder, not elsewhere classified        Problem List Patient Active Problem List   Diagnosis Date Noted  . Breast cancer, right ([place]) 09/16/2018  . Genetic testing 09/09/2018  . Malignant neoplasm of upper-inner quadrant of right breast in female, estrogen  receptor positive ([place]) 08/19/2018  . Family history of breast cancer   . History of penicillin allergy 09/24/2012  . Exposure to strep throat 09/24/2012  . Perioral dermatitis 09/24/2012  . Left otitis media with effusion 08/16/2011  . Hx of antibiotic allergy 05/03/2011  . Unspecified disorder of the teeth and supporting structures 03/30/2008  . ALLERGIC RHINITIS 02/17/2008  . ENDOMETRIOSIS 02/17/2008  . HEADACHE 02/17/2008  . Hx gestational diabetes 02/17/2008  . POSITIVE PPD 12/17/2007    Allyson Sabal [facility] 11/07/2018, 12:28 PM  [place], [place], [address] Phone: [phone]   Fax:  [phone]  Name: Kinesha Auten MRN: [id number] Date of Birth: 1966/01/10   Manus Gunning, PT 11/07/18 12:28 PM

## 2018-11-12 ENCOUNTER — Other Ambulatory Visit: Payer: Self-pay

## 2018-11-12 ENCOUNTER — Encounter: Payer: Self-pay | Admitting: Physical Therapy

## 2018-11-12 ENCOUNTER — Ambulatory Visit: Payer: Managed Care, Other (non HMO) | Attending: General Surgery | Admitting: Physical Therapy

## 2018-11-12 DIAGNOSIS — R600 Localized edema: Secondary | ICD-10-CM | POA: Insufficient documentation

## 2018-11-12 DIAGNOSIS — M79604 Pain in right leg: Secondary | ICD-10-CM | POA: Insufficient documentation

## 2018-11-12 DIAGNOSIS — M25612 Stiffness of left shoulder, not elsewhere classified: Secondary | ICD-10-CM | POA: Diagnosis present

## 2018-11-12 DIAGNOSIS — M25512 Pain in left shoulder: Secondary | ICD-10-CM | POA: Insufficient documentation

## 2018-11-12 DIAGNOSIS — M25511 Pain in right shoulder: Secondary | ICD-10-CM | POA: Insufficient documentation

## 2018-11-12 DIAGNOSIS — R293 Abnormal posture: Secondary | ICD-10-CM | POA: Insufficient documentation

## 2018-11-12 DIAGNOSIS — M25611 Stiffness of right shoulder, not elsewhere classified: Secondary | ICD-10-CM | POA: Diagnosis not present

## 2018-11-12 DIAGNOSIS — M6281 Muscle weakness (generalized): Secondary | ICD-10-CM | POA: Insufficient documentation

## 2018-11-12 DIAGNOSIS — Z483 Aftercare following surgery for neoplasm: Secondary | ICD-10-CM | POA: Diagnosis present

## 2018-11-12 NOTE — Therapy (Signed)
[place], [place], [address] Phone: [phone]   Fax:  [phone]  Physical Therapy Treatment  Patient Details  Name: Megan Armstrong MRN: [id number] Date of Birth: [birth year] Referring Provider (PT): Ave Filter Date: 11/12/2018  PT End of Session - 11/12/18 1542    Visit Number  10    Number of Visits  17    Date for PT Re-Evaluation  11/28/18    PT Start Time  1503    PT Stop Time  1550    PT Time Calculation (min)  47 min    Activity Tolerance  Patient tolerated treatment well    Behavior During Therapy  [facility] for tasks assessed/performed       Past Medical History:  Diagnosis Date  . Allergic rhinitis   . Endometrial hyperplasia   . Endometriosis   . Family history of breast cancer   . Family history of breast cancer   . Hx of endometriosis    rectal vag with laser surgery   . Seasonal allergies   . 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 DIAGNOSTIC LAPAROSCOPY    . MASTECTOMY W/ SENTINEL NODE BIOPSY Bilateral 09/16/2018   Procedure: RIGHT MASTECTOMY WITH RIGHT AXILLARY SENTINEL LYMPH NODE BIOPSY AND LEFT RISK REDUCING MASTECTOMY;  Surgeon: Rolm Bookbinder, MD;  Location: [place];  Service: General;  Laterality: Bilateral;    There were no vitals filed for this visit.  Subjective Assessment - 11/12/18 1504    Subjective  The hip pain is not there. There are moments in the day when my shoulders are nice and loose but then it tightens up. I need another foam and a new yellow band.    Pertinent History  R breast cancer with bilateral mastectomy on 09/16/18 and SLNB (2 nodes both negative), does not need chemo or radiation, endometriosis    Patient Stated Goals  to resume normal functions, drive again, typing, get ROM back    Currently in Pain?  Yes    Pain Score  5     Pain Location  Chest    Pain Orientation  Right;Left    Pain Descriptors / Indicators  Tightness    Pain Type  Acute pain         [facility] PT Assessment - 11/12/18 0001      AROM   Right Shoulder Flexion  172 Degrees    Right Shoulder ABduction  165 Degrees    Left Shoulder Flexion  164 Degrees    Left Shoulder ABduction  160 Degrees                   [facility] Adult PT Treatment/Exercise - 11/12/18 0001      Shoulder Exercises: Supine   Horizontal ABduction  Strengthening;Both;10 reps;Theraband    Theraband Level (Shoulder Horizontal ABduction)  Level 2 (Red)    External Rotation  Strengthening;Both;10 reps;Theraband    Theraband Level (Shoulder External Rotation)  Level 2 (Red)    Flexion  Right;Both;10 reps;Theraband    Theraband Level (Shoulder Flexion)  Level 2 (Red)    Diagonals  Strengthening;Both;Theraband;10 reps    Theraband Level (Shoulder Diagonals)  Level 2 (Red)      Manual Therapy   Soft tissue mobilization  to bilateral pecs where pt has tightness and across healed areas of mastectomy scars  Passive ROM  to bilateral shoulders in direction of flexion, abduction and ER with prolonged holds                  PT Long Term Goals - 10/31/18 1049      PT LONG TERM GOAL #1   Title  Pt will demonstrate 160 degrees of bilateral shoulder flexion to allow her to reach overhead.    Baseline  R 115, L 105, 10/31/18- R 160 L 156    Time  4    Period  Weeks    Status  Partially Met      PT LONG TERM GOAL #2   Title  Pt will demonstrate 160 degrees of bilateral shoulder abduction to allow her to reach out to the side.    Baseline  R 91, L 77, 10/31/18- R 175 L 180    Time  4    Period  Weeks    Status  Achieved      PT LONG TERM GOAL #3   Title  Pt will be independent in a home exercise program for continued strengthening and stretching    Time  4    Period  Weeks    Status  On-going      PT LONG TERM GOAL #4   Title  Pt will  report a 75% improvement in pain across chest to allow her to return to prior level of function.    Baseline  10/31/18- 75% improvement    Time  4    Period  Weeks    Status  Achieved      PT LONG TERM GOAL #5   Title  Pt will report a 75% improvement in swelling in bilateral lateral trunk and inferior axilla to allow improved comfort.    Baseline  10/31/18- 60% improvement    Time  4    Period  Weeks    Status  On-going      Additional Long Term Goals   Additional Long Term Goals  Yes      PT LONG TERM GOAL #6   Title  Pt will experience no pain from sciatica in RLE during the day to allow pt to return to PLOF    Baseline  10/31/18- currently pain is between 5 and 6/10    Time  4    Period  Weeks    Status  New    Target Date  11/28/18            Plan - 11/12/18 1542    Clinical Impression Statement  Pt's demonstrates decreased tightness today. Was able to acheive full ROM. Upgraded pt's theraband to red from yellow today because pt was no longer challenged with the yellow band. Pt is demonstratring improving AROM.    Rehab Potential  Excellent    PT Frequency  2x / week    PT Duration  4 weeks    PT Treatment/Interventions  ADLs/Self Care Home Management;Therapeutic activities;Therapeutic exercise;Patient/family education;Manual techniques;Manual lymph drainage;Scar mobilization;Passive range of motion;Taping;Joint Manipulations    PT Next Visit Plan  continue piriformis massage if needed, STM to bilateral pecs, scar massage once steri strips come off, with Continue PROM to bilateral shoulders to pt's tolerance, pulleys and ball,  AAROM exercises    PT Home Exercise Plan  post op breast exercises, supine cane exercises, supine scap    Consulted and Agree with Plan of Care  Patient       Patient will benefit from skilled  therapeutic intervention in order to improve the following deficits and impairments:  Decreased knowledge of precautions, Decreased range of motion,  Decreased scar mobility, Increased edema, Decreased strength, Increased fascial restricitons, Pain, Postural dysfunction  Visit Diagnosis: 1. Stiffness of right shoulder, not elsewhere classified   2. Stiffness of left shoulder, not elsewhere classified   3. Aftercare following surgery for neoplasm   4. Acute pain of right shoulder   5. Acute pain of left shoulder   6. Muscle weakness (generalized)        Problem List Patient Active Problem List   Diagnosis Date Noted  . Breast cancer, right ([place]) 09/16/2018  . Genetic testing 09/09/2018  . Malignant neoplasm of upper-inner quadrant of right breast in female, estrogen receptor positive ([place]) 08/19/2018  . Family history of breast cancer   . History of penicillin allergy 09/24/2012  . Exposure to strep throat 09/24/2012  . Perioral dermatitis 09/24/2012  . Left otitis media with effusion 08/16/2011  . Hx of antibiotic allergy 05/03/2011  . Unspecified disorder of the teeth and supporting structures 03/30/2008  . ALLERGIC RHINITIS 02/17/2008  . ENDOMETRIOSIS 02/17/2008  . HEADACHE 02/17/2008  . Hx gestational diabetes 02/17/2008  . POSITIVE PPD 12/17/2007    Allyson Sabal [facility] 11/12/2018, 3:55 PM  [place], [place], [address] Phone: [phone]   Fax:  [phone]  Name: Megan Armstrong MRN: [id number] Date of Birth: 11-11-1965  Manus Gunning, PT 11/12/18 3:55 PM

## 2018-11-14 ENCOUNTER — Other Ambulatory Visit: Payer: Self-pay

## 2018-11-14 ENCOUNTER — Encounter: Payer: Self-pay | Admitting: Physical Therapy

## 2018-11-14 ENCOUNTER — Ambulatory Visit: Payer: Managed Care, Other (non HMO) | Admitting: Physical Therapy

## 2018-11-14 DIAGNOSIS — M25612 Stiffness of left shoulder, not elsewhere classified: Secondary | ICD-10-CM

## 2018-11-14 DIAGNOSIS — M25611 Stiffness of right shoulder, not elsewhere classified: Secondary | ICD-10-CM

## 2018-11-14 DIAGNOSIS — M6281 Muscle weakness (generalized): Secondary | ICD-10-CM

## 2018-11-14 DIAGNOSIS — Z483 Aftercare following surgery for neoplasm: Secondary | ICD-10-CM

## 2018-11-14 DIAGNOSIS — M25511 Pain in right shoulder: Secondary | ICD-10-CM

## 2018-11-14 DIAGNOSIS — R293 Abnormal posture: Secondary | ICD-10-CM

## 2018-11-14 DIAGNOSIS — M25512 Pain in left shoulder: Secondary | ICD-10-CM

## 2018-11-14 NOTE — Therapy (Signed)
[place], [place], [address] Phone: [phone]   Fax:  [phone]  Physical Therapy Treatment  Patient Details  Name: Megan Armstrong MRN: [id number] Date of Birth: 1965/05/12 Referring Provider (PT): Ave Filter Date: 11/14/2018  PT End of Session - 11/14/18 1649    Visit Number  11    Number of Visits  17    Date for PT Re-Evaluation  11/28/18    PT Start Time  5449    PT Stop Time  1644    PT Time Calculation (min)  48 min    Activity Tolerance  Patient tolerated treatment well    Behavior During Therapy  [facility] for tasks assessed/performed       Past Medical History:  Diagnosis Date  . Allergic rhinitis   . Endometrial hyperplasia   . Endometriosis   . Family history of breast cancer   . Family history of breast cancer   . Hx of endometriosis    rectal vag with laser surgery   . Seasonal allergies   . 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 DIAGNOSTIC LAPAROSCOPY    . MASTECTOMY W/ SENTINEL NODE BIOPSY Bilateral 09/16/2018   Procedure: RIGHT MASTECTOMY WITH RIGHT AXILLARY SENTINEL LYMPH NODE BIOPSY AND LEFT RISK REDUCING MASTECTOMY;  Surgeon: Rolm Bookbinder, MD;  Location: [place];  Service: General;  Laterality: Bilateral;    There were no vitals filed for this visit.  Subjective Assessment - 11/14/18 1558    Subjective  I have some tightness across my chest.    Pertinent History  R breast cancer with bilateral mastectomy on 09/16/18 and SLNB (2 nodes both negative), does not need chemo or radiation, endometriosis    Patient Stated Goals  to resume normal functions, drive again, typing, get ROM back    Currently in Pain?  No/denies    Pain Score  0-No pain                       [facility] Adult PT Treatment/Exercise - 11/14/18 0001      Exercises   Exercises  Other Exercises    Other Exercises   Instructed pt in entire Strength ABC program using 2 lb weights for exercises and doing all exercises x 10 reps and holding stretches for 15 sec, pt required max verbal and sometimes tactile cues to perform exercises correctly                  PT Long Term Goals - 10/31/18 1049      PT LONG TERM GOAL #1   Title  Pt will demonstrate 160 degrees of bilateral shoulder flexion to allow her to reach overhead.    Baseline  R 115, L 105, 10/31/18- R 160 L 156    Time  4    Period  Weeks    Status  Partially Met      PT LONG TERM GOAL #2   Title  Pt will demonstrate 160 degrees of bilateral shoulder abduction to allow her to reach out to the side.    Baseline  R 91, L 77, 10/31/18- R 175 L 180    Time  4    Period  Weeks    Status  Achieved      PT LONG TERM GOAL #3  Title  Pt will be independent in a home exercise program for continued strengthening and stretching    Time  4    Period  Weeks    Status  On-going      PT LONG TERM GOAL #4   Title  Pt will report a 75% improvement in pain across chest to allow her to return to prior level of function.    Baseline  10/31/18- 75% improvement    Time  4    Period  Weeks    Status  Achieved      PT LONG TERM GOAL #5   Title  Pt will report a 75% improvement in swelling in bilateral lateral trunk and inferior axilla to allow improved comfort.    Baseline  10/31/18- 60% improvement    Time  4    Period  Weeks    Status  On-going      Additional Long Term Goals   Additional Long Term Goals  Yes      PT LONG TERM GOAL #6   Title  Pt will experience no pain from sciatica in RLE during the day to allow pt to return to PLOF    Baseline  10/31/18- currently pain is between 5 and 6/10    Time  4    Period  Weeks    Status  New    Target Date  11/28/18            Plan - 11/14/18 1650    Clinical Impression Statement  Pt is demonstrating improved active ROM. She  will be on vacation next week so instructed pt in entire Strength ABC program today so she would be able to exercise on vacation. Pt required mod to max verbal and tactile cues to perform exercises correctly especially tricep extension. Issued handout to pt to do while on vacation and instructed her how to progress exercise program.    Stability/Clinical Decision Making  Evolving/Moderate complexity    Rehab Potential  Excellent    PT Frequency  2x / week    PT Duration  4 weeks    PT Treatment/Interventions  ADLs/Self Care Home Management;Therapeutic activities;Therapeutic exercise;Patient/family education;Manual techniques;Manual lymph drainage;Scar mobilization;Passive range of motion;Taping;Joint Manipulations    PT Next Visit Plan  assess indep with Strength ABC, STM to bilateral pecs, scar massage once steri strips come off, with Continue PROM to bilateral shoulders to pt's tolerance    PT Home Exercise Plan  post op breast exercises, supine cane exercises, supine scap, Strength ABC program    Consulted and Agree with Plan of Care  Patient       Patient will benefit from skilled therapeutic intervention in order to improve the following deficits and impairments:  Decreased knowledge of precautions, Decreased range of motion, Decreased scar mobility, Increased edema, Decreased strength, Increased fascial restricitons, Pain, Postural dysfunction  Visit Diagnosis: 1. Stiffness of right shoulder, not elsewhere classified   2. Stiffness of left shoulder, not elsewhere classified   3. Aftercare following surgery for neoplasm   4. Acute pain of right shoulder   5. Acute pain of left shoulder   6. Muscle weakness (generalized)   7. Abnormal posture        Problem List Patient Active Problem List   Diagnosis Date Noted  . Breast cancer, right ([place]) 09/16/2018  . Genetic testing 09/09/2018  . Malignant neoplasm of upper-inner quadrant of right breast in female, estrogen receptor positive  ([place]) 08/19/2018  . Family history of  breast cancer   . History of penicillin allergy 09/24/2012  . Exposure to strep throat 09/24/2012  . Perioral dermatitis 09/24/2012  . Left otitis media with effusion 08/16/2011  . Hx of antibiotic allergy 05/03/2011  . Unspecified disorder of the teeth and supporting structures 03/30/2008  . ALLERGIC RHINITIS 02/17/2008  . ENDOMETRIOSIS 02/17/2008  . HEADACHE 02/17/2008  . Hx gestational diabetes 02/17/2008  . POSITIVE PPD 12/17/2007    Allyson Sabal [facility] 11/14/2018, 4:52 PM  [place], [place], [address] Phone: [phone]   Fax:  [phone]  Name: Megan Armstrong MRN: [id number] Date of Birth: 1966-01-08  Manus Gunning, PT 11/14/18 4:52 PM

## 2018-11-26 ENCOUNTER — Ambulatory Visit: Payer: Managed Care, Other (non HMO) | Admitting: Physical Therapy

## 2018-11-26 ENCOUNTER — Other Ambulatory Visit: Payer: Self-pay

## 2018-11-26 ENCOUNTER — Encounter: Payer: Self-pay | Admitting: Physical Therapy

## 2018-11-26 DIAGNOSIS — M25511 Pain in right shoulder: Secondary | ICD-10-CM

## 2018-11-26 DIAGNOSIS — M25611 Stiffness of right shoulder, not elsewhere classified: Secondary | ICD-10-CM | POA: Diagnosis not present

## 2018-11-26 NOTE — Therapy (Signed)
[place], [place], [address] Phone: [phone]   Fax:  [phone]  Physical Therapy Treatment  Patient Details  Name: Megan Armstrong MRN: [id number] Date of Birth: 1966-02-21 Referring Provider (PT): Donne Hazel   Encounter Date: 11/26/2018  PT End of Session - 11/26/18 1121    Visit Number  12    Number of Visits  17    Date for PT Re-Evaluation  11/28/18    PT Start Time  2353    PT Stop Time  1120    PT Time Calculation (min)  40 min    Activity Tolerance  Patient tolerated treatment well    Behavior During Therapy  [facility] for tasks assessed/performed       Past Medical History:  Diagnosis Date  . Allergic rhinitis   . Endometrial hyperplasia   . Endometriosis   . Family history of breast cancer   . Family history of breast cancer   . Hx of endometriosis    rectal vag with laser surgery   . Seasonal allergies   . 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 DIAGNOSTIC LAPAROSCOPY    . MASTECTOMY W/ SENTINEL NODE BIOPSY Bilateral 09/16/2018   Procedure: RIGHT MASTECTOMY WITH RIGHT AXILLARY SENTINEL LYMPH NODE BIOPSY AND LEFT RISK REDUCING MASTECTOMY;  Surgeon: Rolm Bookbinder, MD;  Location: [place];  Service: General;  Laterality: Bilateral;    There were no vitals filed for this visit.  Subjective Assessment - 11/26/18 1041    Subjective  This sciatica seems to be coming back. I am feeling in the calf. I was not great with my exercises because I did not have weights. I am going today to get some weights.    Pertinent History  R breast cancer with bilateral mastectomy on 09/16/18 and SLNB (2 nodes both negative), does not need chemo or radiation, endometriosis    Patient Stated Goals  to resume normal functions, drive again, typing, get ROM back    Currently in Pain?   Yes    Pain Score  6     Pain Location  Leg    Pain Orientation  Right    Pain Descriptors / Indicators  Radiating    Pain Type  Acute pain    Pain Onset  Yesterday    Multiple Pain Sites  Yes    Pain Score  3    Pain Location  Chest    Pain Orientation  Left;Right    Pain Descriptors / Indicators  Aching    Pain Type  Acute pain    Pain Onset  In the past 7 days                       [facility] Adult PT Treatment/Exercise - 11/26/18 0001      Manual Therapy   Manual Therapy  Manual Lymphatic Drainage (MLD);Soft tissue mobilization    Soft tissue mobilization  in left sidelying to right piriformis muscle, tightness was palpable, pt stated after STM she no longer had radiating pain    Manual Lymphatic Drainage (MLD)  in supine: short neck, 5 diaphragmatic breaths, right inguinal nodes and establishment of axillo inguinal pathway, R lateral trunk moving fluid towards pathways                  PT Long  Term Goals - 10/31/18 1049      PT LONG TERM GOAL #1   Title  Pt will demonstrate 160 degrees of bilateral shoulder flexion to allow her to reach overhead.    Baseline  R 115, L 105, 10/31/18- R 160 L 156    Time  4    Period  Weeks    Status  Partially Met      PT LONG TERM GOAL #2   Title  Pt will demonstrate 160 degrees of bilateral shoulder abduction to allow her to reach out to the side.    Baseline  R 91, L 77, 10/31/18- R 175 L 180    Time  4    Period  Weeks    Status  Achieved      PT LONG TERM GOAL #3   Title  Pt will be independent in a home exercise program for continued strengthening and stretching    Time  4    Period  Weeks    Status  On-going      PT LONG TERM GOAL #4   Title  Pt will report a 75% improvement in pain across chest to allow her to return to prior level of function.    Baseline  10/31/18- 75% improvement    Time  4    Period  Weeks    Status  Achieved      PT LONG TERM GOAL #5   Title  Pt will report a 75% improvement  in swelling in bilateral lateral trunk and inferior axilla to allow improved comfort.    Baseline  10/31/18- 60% improvement    Time  4    Period  Weeks    Status  On-going      Additional Long Term Goals   Additional Long Term Goals  Yes      PT LONG TERM GOAL #6   Title  Pt will experience no pain from sciatica in RLE during the day to allow pt to return to PLOF    Baseline  10/31/18- currently pain is between 5 and 6/10    Time  4    Period  Weeks    Status  New    Target Date  11/28/18            Plan - 11/26/18 1125    Clinical Impression Statement  Pt returned to PT as she was on vacation last week. Yesterday she began having radiating sciatic pain again so performed soft tissue to piriformis and at end of treatment pt was no longer having radiating pain. She has started stretching her piriformis again. She also reports new onset of right lateral trunk swelling so performed MLD to this area today and educated pt to move foam piece over area of swelling.    Stability/Clinical Decision Making  Evolving/Moderate complexity    Rehab Potential  Excellent    PT Frequency  2x / week    PT Duration  4 weeks    PT Treatment/Interventions  ADLs/Self Care Home Management;Therapeutic activities;Therapeutic exercise;Patient/family education;Manual techniques;Manual lymph drainage;Scar mobilization;Passive range of motion;Taping;Joint Manipulations    PT Next Visit Plan  assess goals and recert, assess indep with Strength ABC, STM to bilateral pecs, scar massage once steri strips come off, with Continue PROM to bilateral shoulders to pt's tolerance    PT Home Exercise Plan  post op breast exercises, supine cane exercises, supine scap, Strength ABC program    Consulted and Agree with Plan of Care  Patient       Patient will benefit from skilled therapeutic intervention in order to improve the following deficits and impairments:  Decreased knowledge of precautions, Decreased range of motion,  Decreased scar mobility, Increased edema, Decreased strength, Increased fascial restricitons, Pain, Postural dysfunction  Visit Diagnosis: 1. Stiffness of right shoulder, not elsewhere classified   2. Acute pain of right shoulder        Problem List Patient Active Problem List   Diagnosis Date Noted  . Breast cancer, right ([place]) 09/16/2018  . Genetic testing 09/09/2018  . Malignant neoplasm of upper-inner quadrant of right breast in female, estrogen receptor positive ([place]) 08/19/2018  . Family history of breast cancer   . History of penicillin allergy 09/24/2012  . Exposure to strep throat 09/24/2012  . Perioral dermatitis 09/24/2012  . Left otitis media with effusion 08/16/2011  . Hx of antibiotic allergy 05/03/2011  . Unspecified disorder of the teeth and supporting structures 03/30/2008  . ALLERGIC RHINITIS 02/17/2008  . ENDOMETRIOSIS 02/17/2008  . HEADACHE 02/17/2008  . Hx gestational diabetes 02/17/2008  . POSITIVE PPD 12/17/2007    Allyson Sabal [facility] 11/26/2018, 11:28 AM  [place], [place], [address] Phone: [phone]   Fax:  [phone]  Name: Brandice Busser MRN: [id number] Date of Birth: July 29, 1965  Manus Gunning, PT 11/26/18 11:28 AM

## 2018-11-28 ENCOUNTER — Other Ambulatory Visit: Payer: Self-pay

## 2018-11-28 ENCOUNTER — Encounter: Payer: Self-pay | Admitting: Physical Therapy

## 2018-11-28 ENCOUNTER — Ambulatory Visit: Payer: Managed Care, Other (non HMO) | Admitting: Physical Therapy

## 2018-11-28 DIAGNOSIS — R293 Abnormal posture: Secondary | ICD-10-CM

## 2018-11-28 DIAGNOSIS — M25611 Stiffness of right shoulder, not elsewhere classified: Secondary | ICD-10-CM | POA: Diagnosis not present

## 2018-11-28 DIAGNOSIS — M25612 Stiffness of left shoulder, not elsewhere classified: Secondary | ICD-10-CM

## 2018-11-28 DIAGNOSIS — M25511 Pain in right shoulder: Secondary | ICD-10-CM

## 2018-11-28 DIAGNOSIS — M79604 Pain in right leg: Secondary | ICD-10-CM

## 2018-11-28 DIAGNOSIS — R600 Localized edema: Secondary | ICD-10-CM

## 2018-11-28 DIAGNOSIS — M25512 Pain in left shoulder: Secondary | ICD-10-CM

## 2018-11-28 DIAGNOSIS — M6281 Muscle weakness (generalized): Secondary | ICD-10-CM

## 2018-11-28 DIAGNOSIS — Z483 Aftercare following surgery for neoplasm: Secondary | ICD-10-CM

## 2018-11-28 NOTE — Therapy (Signed)
[place], [place], [address] Phone: [phone]   Fax:  [phone]  Physical Therapy Treatment  Patient Details  Name: Megan Armstrong MRN: [id number] Date of Birth: 04/23/1965 Referring Provider (PT): Ave Filter Date: 11/28/2018  PT End of Session - 11/28/18 1222    Visit Number  13    Number of Visits  17    Date for PT Re-Evaluation  12/26/18    PT Start Time  1132    PT Stop Time  1216    PT Time Calculation (min)  44 min    Activity Tolerance  Patient tolerated treatment well    Behavior During Therapy  [facility] for tasks assessed/performed       Past Medical History:  Diagnosis Date  . Allergic rhinitis   . Endometrial hyperplasia   . Endometriosis   . Family history of breast cancer   . Family history of breast cancer   . Hx of endometriosis    rectal vag with laser surgery   . Seasonal allergies   . 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 DIAGNOSTIC LAPAROSCOPY    . MASTECTOMY W/ SENTINEL NODE BIOPSY Bilateral 09/16/2018   Procedure: RIGHT MASTECTOMY WITH RIGHT AXILLARY SENTINEL LYMPH NODE BIOPSY AND LEFT RISK REDUCING MASTECTOMY;  Surgeon: Rolm Bookbinder, MD;  Location: [facility];  Service: General;  Laterality: Bilateral;    There were no vitals filed for this visit.  Subjective Assessment - 11/28/18 1133    Subjective  My sciatica is the same as the other day. There are times when I have absolutely no pain. It was better when I left but then it came back the next morning. I think the swelling is the same.    Pertinent History  R breast cancer with bilateral mastectomy on 09/16/18 and SLNB (2 nodes both negative), does not need chemo or radiation, endometriosis    Patient Stated Goals  to resume normal functions, drive again, typing, get ROM back                        [facility] Adult PT Treatment/Exercise - 11/28/18 0001      Manual Therapy   Manual Therapy  Manual Lymphatic Drainage (MLD);Soft tissue mobilization;Edema management    Edema Management  created a second for chip pack so pt can have one to wear on both sides    Soft tissue mobilization  using biotone in left sidelying to right piriformis muscle, tightness was palpable but released with massage    Manual Lymphatic Drainage (MLD)  issued handout and educated pt in correct technique and hand placement throughout and had pt demonstrate: in supine: 5 diagmatic breaths, left inguinal nodes and establishment of axillo inguinal pathway, L lateral trunk moving fluid towards pathways                  PT Long Term Goals - 11/28/18 1134      PT LONG TERM GOAL #1   Title  Pt will demonstrate 160 degrees of bilateral shoulder flexion to allow her to reach overhead.    Baseline  R 115, L 105, 10/31/18- R 160 L 156, 820/20- R 166 L 162    Time  4    Period  Weeks    Status  Achieved  PT LONG TERM GOAL #2   Title  Pt will demonstrate 160 degrees of bilateral shoulder abduction to allow her to reach out to the side.    Baseline  R 91, L 77, 10/31/18- R 175 L 180    Time  4    Period  Weeks    Status  Achieved      PT LONG TERM GOAL #3   Title  Pt will be independent in a home exercise program for continued strengthening and stretching    Time  4    Period  Weeks    Status  On-going      PT LONG TERM GOAL #4   Title  Pt will report a 75% improvement in pain across chest to allow her to return to prior level of function.    Baseline  10/31/18- 75% improvement    Time  4    Period  Weeks    Status  Achieved      PT LONG TERM GOAL #5   Title  Pt will report a 75% improvement in swelling in bilateral lateral trunk and inferior axilla to allow improved comfort.    Baseline  10/31/18- 60% improvement, 11/28/18- pt has developed swelling over her vacation- so  no improvement as of now    Time  4    Period  Weeks    Status  On-going      PT LONG TERM GOAL #6   Title  Pt will experience no pain from sciatica in RLE during the day to allow pt to return to PLOF    Baseline  10/31/18- currently pain is between 5 and 6/10, 11/28/18- 4 or 5/10    Time  4    Period  Weeks    Status  On-going            Plan - 11/28/18 1223    Clinical Impression Statement  Assessed pt's progress towards goals in therapy. Pt is progressing towards goals and has met her ROM goals. She continues to have some discomfort from sciatica and also swelling in bilateral lateral trunk most likely post surgical. Pt would benefit from additional skilled PT visits to decrease swelling, alleviate pain from sciatica and progress pt towards independence with a home exercise program.    Rehab Potential  Excellent    PT Frequency  2x / week    PT Duration  4 weeks    PT Treatment/Interventions  ADLs/Self Care Home Management;Therapeutic activities;Therapeutic exercise;Patient/family education;Manual techniques;Manual lymph drainage;Scar mobilization;Passive range of motion;Taping;Joint Manipulations    PT Next Visit Plan  STM as needed to piriformis, assess indep with Strength ABC, STM to bilateral pecs, scar massage once steri strips come off, with Continue PROM to bilateral shoulders to pt's tolerance    PT Home Exercise Plan  post op breast exercises, supine cane exercises, supine scap, Strength ABC program    Consulted and Agree with Plan of Care  Patient       Patient will benefit from skilled therapeutic intervention in order to improve the following deficits and impairments:  Decreased knowledge of precautions, Decreased range of motion, Decreased scar mobility, Increased edema, Decreased strength, Increased fascial restricitons, Pain, Postural dysfunction  Visit Diagnosis: Localized edema - Plan: PT plan of care cert/re-cert  Pain in right leg - Plan: PT plan of care  cert/re-cert  Stiffness of right shoulder, not elsewhere classified - Plan: PT plan of care cert/re-cert  Acute pain of right shoulder - Plan: PT plan  of care cert/re-cert  Stiffness of left shoulder, not elsewhere classified - Plan: PT plan of care cert/re-cert  Aftercare following surgery for neoplasm - Plan: PT plan of care cert/re-cert  Acute pain of left shoulder - Plan: PT plan of care cert/re-cert  Muscle weakness (generalized) - Plan: PT plan of care cert/re-cert  Abnormal posture - Plan: PT plan of care cert/re-cert     Problem List Patient Active Problem List   Diagnosis Date Noted  . Breast cancer, right ([place]) 09/16/2018  . Genetic testing 09/09/2018  . Malignant neoplasm of upper-inner quadrant of right breast in female, estrogen receptor positive ([place]) 08/19/2018  . Family history of breast cancer   . History of penicillin allergy 09/24/2012  . Exposure to strep throat 09/24/2012  . Perioral dermatitis 09/24/2012  . Left otitis media with effusion 08/16/2011  . Hx of antibiotic allergy 05/03/2011  . Unspecified disorder of the teeth and supporting structures 03/30/2008  . ALLERGIC RHINITIS 02/17/2008  . ENDOMETRIOSIS 02/17/2008  . HEADACHE 02/17/2008  . Hx gestational diabetes 02/17/2008  . POSITIVE PPD 12/17/2007    Allyson Sabal [facility] 11/28/2018, 12:28 PM  [place], [place], [address] Phone: [phone]   Fax:  [phone]  Name: Megan Armstrong MRN: [id number] Date of Birth: Mar 04, 1966  Manus Gunning, PT 11/28/18 12:28 PM

## 2018-11-28 NOTE — Patient Instructions (Signed)
Deep Effective Breath   Standing, sitting, or laying down, place both hands on the belly. Take a deep breath IN, expanding the belly; then breath OUT, contracting the belly. Repeat __5__ times. Do __2-3__ sessions per day and before your self massage.  http://gt2.exer.us/866    Copyright  VHI. All rights reserved.  Axilla to Inguinal Nodes - Sweep   On involved side, make 5 circles at groin at panty line, then pump (stretching skin) about 10 minutes from armpit along side of trunk to outer hip, making your other pathway. Do __1_ time per day.  Then do 5 circles at involved groin where you started! Good job!! Do __1_ time per day.  Repeat all on opposite side.   Copyright  VHI. All rights reserved.

## 2018-12-03 ENCOUNTER — Other Ambulatory Visit: Payer: Self-pay

## 2018-12-03 ENCOUNTER — Encounter: Payer: Self-pay | Admitting: Physical Therapy

## 2018-12-03 ENCOUNTER — Ambulatory Visit: Payer: Managed Care, Other (non HMO) | Admitting: Physical Therapy

## 2018-12-03 DIAGNOSIS — Z483 Aftercare following surgery for neoplasm: Secondary | ICD-10-CM

## 2018-12-03 DIAGNOSIS — R293 Abnormal posture: Secondary | ICD-10-CM

## 2018-12-03 DIAGNOSIS — M25611 Stiffness of right shoulder, not elsewhere classified: Secondary | ICD-10-CM | POA: Diagnosis not present

## 2018-12-03 NOTE — Therapy (Signed)
[place], [place], [address] Phone: [phone]   Fax:  [phone]  Physical Therapy Treatment  Patient Details  Name: Megan Armstrong MRN: [id number] Date of Birth: 1965/12/09 Referring Provider (PT): Ave Filter Date: 12/03/2018  PT End of Session - 12/03/18 1219    Visit Number  14    Number of Visits  17    Date for PT Re-Evaluation  12/26/18    PT Start Time  1133    PT Stop Time  1219    PT Time Calculation (min)  46 min    Activity Tolerance  Patient tolerated treatment well    Behavior During Therapy  [facility] for tasks assessed/performed       Past Medical History:  Diagnosis Date  . Allergic rhinitis   . Endometrial hyperplasia   . Endometriosis   . Family history of breast cancer   . Family history of breast cancer   . Hx of endometriosis    rectal vag with laser surgery   . Seasonal allergies   . 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 DIAGNOSTIC LAPAROSCOPY    . MASTECTOMY W/ SENTINEL NODE BIOPSY Bilateral 09/16/2018   Procedure: RIGHT MASTECTOMY WITH RIGHT AXILLARY SENTINEL LYMPH NODE BIOPSY AND LEFT RISK REDUCING MASTECTOMY;  Surgeon: Rolm Bookbinder, MD;  Location: [facility];  Service: General;  Laterality: Bilateral;    There were no vitals filed for this visit.  Subjective Assessment - 12/03/18 1133    Subjective  The pirformis is improving. The method we have been doing has really helped with the swelling. The chest is not as tight.    Pertinent History  R breast cancer with bilateral mastectomy on 09/16/18 and SLNB (2 nodes both negative), does not need chemo or radiation, endometriosis    Patient Stated Goals  to resume normal functions, drive again, typing, get ROM back    Currently in Pain?  Yes    Pain Score  1     Pain Location  Leg    Pain Orientation  Right                       [facility] Adult PT Treatment/Exercise - 12/03/18 0001      Manual Therapy   Soft tissue mobilization  to bilateral chest along entire pec and along mastectomy scars    Myofascial Release  gently using cross hand to mastectomy scars    Manual Lymphatic Drainage (MLD)   briefly in supine: 5 diagmatic breaths, left inguinal nodes and establishment of axillo inguinal pathway, L lateral trunk moving fluid towards pathways then repeated on opposite side                  PT Long Term Goals - 11/28/18 1134      PT LONG TERM GOAL #1   Title  Pt will demonstrate 160 degrees of bilateral shoulder flexion to allow her to reach overhead.    Baseline  R 115, L 105, 10/31/18- R 160 L 156, 820/20- R 166 L 162    Time  4    Period  Weeks    Status  Achieved      PT LONG TERM GOAL #2   Title  Pt will demonstrate 160 degrees of bilateral shoulder abduction to allow her to  reach out to the side.    Baseline  R 91, L 77, 10/31/18- R 175 L 180    Time  4    Period  Weeks    Status  Achieved      PT LONG TERM GOAL #3   Title  Pt will be independent in a home exercise program for continued strengthening and stretching    Time  4    Period  Weeks    Status  On-going      PT LONG TERM GOAL #4   Title  Pt will report a 75% improvement in pain across chest to allow her to return to prior level of function.    Baseline  10/31/18- 75% improvement    Time  4    Period  Weeks    Status  Achieved      PT LONG TERM GOAL #5   Title  Pt will report a 75% improvement in swelling in bilateral lateral trunk and inferior axilla to allow improved comfort.    Baseline  10/31/18- 60% improvement, 11/28/18- pt has developed swelling over her vacation- so no improvement as of now    Time  4    Period  Weeks    Status  On-going      PT LONG TERM GOAL #6   Title  Pt will experience no pain from sciatica in RLE during the day to allow pt to return to  PLOF    Baseline  10/31/18- currently pain is between 5 and 6/10, 11/28/18- 4 or 5/10    Time  4    Period  Weeks    Status  On-going            Plan - 12/03/18 1220    Clinical Impression Statement  Focused today on myofascial release and soft tissue mobilization to bilateral pec and mastectomy scars. The left scar is completely healed and on the right it is mostly healed. Avoid place where 2 sutures remain intact on right. Continued with MLD to bilateral trunk for swelling. Pt has been doing this at home and can tell a difference. She is having decreased swelling and decreased discomfort in those areas.    Stability/Clinical Decision Making  Evolving/Moderate complexity    Rehab Potential  Excellent    PT Frequency  2x / week    PT Duration  4 weeks    PT Treatment/Interventions  ADLs/Self Care Home Management;Therapeutic activities;Therapeutic exercise;Patient/family education;Manual techniques;Manual lymph drainage;Scar mobilization;Passive range of motion;Taping;Joint Manipulations    PT Next Visit Plan  STM as needed to piriformis, assess indep with Strength ABC, STM to bilateral pecs, scar massage once steri strips come off, with Continue PROM to bilateral shoulders to pt's tolerance    Consulted and Agree with Plan of Care  Patient       Patient will benefit from skilled therapeutic intervention in order to improve the following deficits and impairments:  Decreased knowledge of precautions, Decreased range of motion, Decreased scar mobility, Increased edema, Decreased strength, Increased fascial restricitons, Pain, Postural dysfunction  Visit Diagnosis: Aftercare following surgery for neoplasm  Abnormal posture     Problem List Patient Active Problem List   Diagnosis Date Noted  . Breast cancer, right ([place]) 09/16/2018  . Genetic testing 09/09/2018  . Malignant neoplasm of upper-inner quadrant of right breast in female, estrogen receptor positive ([place]) 08/19/2018  .  Family history of breast cancer   . History of penicillin allergy 09/24/2012  . Exposure to strep  throat 09/24/2012  . Perioral dermatitis 09/24/2012  . Left otitis media with effusion 08/16/2011  . Hx of antibiotic allergy 05/03/2011  . Unspecified disorder of the teeth and supporting structures 03/30/2008  . ALLERGIC RHINITIS 02/17/2008  . ENDOMETRIOSIS 02/17/2008  . HEADACHE 02/17/2008  . Hx gestational diabetes 02/17/2008  . POSITIVE PPD 12/17/2007    Allyson Sabal [facility] 12/03/2018, 12:22 PM  [place], [place], [address] Phone: [phone]   Fax:  [phone]  Name: Megan Armstrong MRN: [id number] Date of Birth: February 13, 1966  Manus Gunning, PT 12/03/18 12:23 PM

## 2018-12-05 ENCOUNTER — Ambulatory Visit: Payer: Managed Care, Other (non HMO) | Admitting: Physical Therapy

## 2018-12-05 ENCOUNTER — Other Ambulatory Visit: Payer: Self-pay

## 2018-12-05 ENCOUNTER — Encounter: Payer: Self-pay | Admitting: Physical Therapy

## 2018-12-05 DIAGNOSIS — Z483 Aftercare following surgery for neoplasm: Secondary | ICD-10-CM

## 2018-12-05 DIAGNOSIS — M25611 Stiffness of right shoulder, not elsewhere classified: Secondary | ICD-10-CM | POA: Diagnosis not present

## 2018-12-05 DIAGNOSIS — R293 Abnormal posture: Secondary | ICD-10-CM

## 2018-12-05 NOTE — Therapy (Signed)
[place], [place], [address] Phone: [phone]   Fax:  [phone]  Physical Therapy Treatment  Patient Details  Name: Megan Armstrong MRN: [id number] Date of Birth: 11/09/1965 Referring Provider (PT): Ave Filter Date: 12/05/2018  PT End of Session - 12/05/18 1122    Visit Number  15    Number of Visits  17    Date for PT Re-Evaluation  12/26/18    PT Start Time  1034    PT Stop Time  1117    PT Time Calculation (min)  43 min    Activity Tolerance  Patient tolerated treatment well    Behavior During Therapy  [facility] for tasks assessed/performed       Past Medical History:  Diagnosis Date  . Allergic rhinitis   . Endometrial hyperplasia   . Endometriosis   . Family history of breast cancer   . Family history of breast cancer   . Hx of endometriosis    rectal vag with laser surgery   . Seasonal allergies   . 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 DIAGNOSTIC LAPAROSCOPY    . MASTECTOMY W/ SENTINEL NODE BIOPSY Bilateral 09/16/2018   Procedure: RIGHT MASTECTOMY WITH RIGHT AXILLARY SENTINEL LYMPH NODE BIOPSY AND LEFT RISK REDUCING MASTECTOMY;  Surgeon: Rolm Bookbinder, MD;  Location: [facility];  Service: General;  Laterality: Bilateral;    There were no vitals filed for this visit.  Subjective Assessment - 12/05/18 1036    Subjective  Right now I have no symptoms from my sciatica. My weights just arrived this morning. My chest is tight again.    Pertinent History  R breast cancer with bilateral mastectomy on 09/16/18 and SLNB (2 nodes both negative), does not need chemo or radiation, endometriosis    Patient Stated Goals  to resume normal functions, drive again, typing, get ROM back    Currently in Pain?  Yes    Pain Score  3     Pain Location  Axilla    Pain  Orientation  Right    Pain Descriptors / Indicators  Sharp    Pain Type  Acute pain    Multiple Pain Sites  No                       [facility] Adult PT Treatment/Exercise - 12/05/18 0001      Manual Therapy   Soft tissue mobilization  with pt lying over foam roll performed soft tissue massage across bilateral pecs to help decrease tightness using biotone                  PT Long Term Goals - 11/28/18 1134      PT LONG TERM GOAL #1   Title  Pt will demonstrate 160 degrees of bilateral shoulder flexion to allow her to reach overhead.    Baseline  R 115, L 105, 10/31/18- R 160 L 156, 820/20- R 166 L 162    Time  4    Period  Weeks    Status  Achieved      PT LONG TERM GOAL #2   Title  Pt will demonstrate 160 degrees of bilateral shoulder abduction to allow her to reach out to the side.    Baseline  R 91, L 77, 10/31/18- R  175 L 180    Time  4    Period  Weeks    Status  Achieved      PT LONG TERM GOAL #3   Title  Pt will be independent in a home exercise program for continued strengthening and stretching    Time  4    Period  Weeks    Status  On-going      PT LONG TERM GOAL #4   Title  Pt will report a 75% improvement in pain across chest to allow her to return to prior level of function.    Baseline  10/31/18- 75% improvement    Time  4    Period  Weeks    Status  Achieved      PT LONG TERM GOAL #5   Title  Pt will report a 75% improvement in swelling in bilateral lateral trunk and inferior axilla to allow improved comfort.    Baseline  10/31/18- 60% improvement, 11/28/18- pt has developed swelling over her vacation- so no improvement as of now    Time  4    Period  Weeks    Status  On-going      PT LONG TERM GOAL #6   Title  Pt will experience no pain from sciatica in RLE during the day to allow pt to return to PLOF    Baseline  10/31/18- currently pain is between 5 and 6/10, 11/28/18- 4 or 5/10    Time  4    Period  Weeks    Status  On-going             Plan - 12/05/18 1123    Clinical Impression Statement  Continued to focus on decreasing tightness across bilateral pec. Had pt lie supine over foam roll today while performing soft tissue mobilization to bilateral chest. Issued pt information to obtain foam roll for home use. Pt felt much more loose by end of session.    Stability/Clinical Decision Making  Evolving/Moderate complexity    Rehab Potential  Excellent    PT Frequency  2x / week    PT Duration  4 weeks    PT Treatment/Interventions  ADLs/Self Care Home Management;Therapeutic activities;Therapeutic exercise;Patient/family education;Manual techniques;Manual lymph drainage;Scar mobilization;Passive range of motion;Taping;Joint Manipulations    PT Next Visit Plan  STM as needed to piriformis, assess indep with Strength ABC, STM to bilateral pecs, scar massage once steri strips come off, with Continue PROM to bilateral shoulders to pt's tolerance    PT Home Exercise Plan  post op breast exercises, supine cane exercises, supine scap, Strength ABC program    Consulted and Agree with Plan of Care  Patient       Patient will benefit from skilled therapeutic intervention in order to improve the following deficits and impairments:  Decreased knowledge of precautions, Decreased range of motion, Decreased scar mobility, Increased edema, Decreased strength, Increased fascial restricitons, Pain, Postural dysfunction  Visit Diagnosis: Aftercare following surgery for neoplasm  Abnormal posture     Problem List Patient Active Problem List   Diagnosis Date Noted  . Breast cancer, right ([place]) 09/16/2018  . Genetic testing 09/09/2018  . Malignant neoplasm of upper-inner quadrant of right breast in female, estrogen receptor positive ([place]) 08/19/2018  . Family history of breast cancer   . History of penicillin allergy 09/24/2012  . Exposure to strep throat 09/24/2012  . Perioral dermatitis 09/24/2012  . Left otitis media with  effusion 08/16/2011  . Hx of antibiotic allergy  05/03/2011  . Unspecified disorder of the teeth and supporting structures 03/30/2008  . ALLERGIC RHINITIS 02/17/2008  . ENDOMETRIOSIS 02/17/2008  . HEADACHE 02/17/2008  . Hx gestational diabetes 02/17/2008  . POSITIVE PPD 12/17/2007    Allyson Sabal [facility] 12/05/2018, 11:24 AM  [place], [place], [address] Phone: [phone]   Fax:  [phone]  Name: Megan Armstrong MRN: [id number] Date of Birth: 1966/04/06  Manus Gunning, PT 12/05/18 11:25 AM

## 2018-12-09 ENCOUNTER — Telehealth: Payer: Self-pay | Admitting: Adult Health

## 2018-12-09 NOTE — Telephone Encounter (Signed)
I talk with patient regarding schedule  

## 2018-12-10 ENCOUNTER — Encounter: Payer: Self-pay | Admitting: Physical Therapy

## 2018-12-10 ENCOUNTER — Other Ambulatory Visit: Payer: Self-pay

## 2018-12-10 ENCOUNTER — Ambulatory Visit: Payer: Managed Care, Other (non HMO) | Attending: General Surgery | Admitting: Physical Therapy

## 2018-12-10 DIAGNOSIS — Z483 Aftercare following surgery for neoplasm: Secondary | ICD-10-CM

## 2018-12-10 DIAGNOSIS — M6281 Muscle weakness (generalized): Secondary | ICD-10-CM | POA: Insufficient documentation

## 2018-12-10 DIAGNOSIS — M25512 Pain in left shoulder: Secondary | ICD-10-CM | POA: Diagnosis present

## 2018-12-10 DIAGNOSIS — R293 Abnormal posture: Secondary | ICD-10-CM | POA: Diagnosis present

## 2018-12-10 DIAGNOSIS — M25611 Stiffness of right shoulder, not elsewhere classified: Secondary | ICD-10-CM | POA: Diagnosis present

## 2018-12-10 DIAGNOSIS — M25511 Pain in right shoulder: Secondary | ICD-10-CM | POA: Insufficient documentation

## 2018-12-10 DIAGNOSIS — R600 Localized edema: Secondary | ICD-10-CM | POA: Insufficient documentation

## 2018-12-10 DIAGNOSIS — M79604 Pain in right leg: Secondary | ICD-10-CM | POA: Insufficient documentation

## 2018-12-10 DIAGNOSIS — M25612 Stiffness of left shoulder, not elsewhere classified: Secondary | ICD-10-CM | POA: Insufficient documentation

## 2018-12-10 NOTE — Therapy (Signed)
[place], [place], [address] Phone: [phone]   Fax:  [phone]  Physical Therapy Treatment  Patient Details  Name: Megan Armstrong MRN: [id number] Date of Birth: Aug 29, 1965 Referring Provider (PT): Ave Filter Date: 12/10/2018  PT End of Session - 12/10/18 1219    Visit Number  16    Number of Visits  17    Date for PT Re-Evaluation  12/26/18    PT Start Time  [id number]    PT Stop Time  1219    PT Time Calculation (min)  44 min    Activity Tolerance  Patient tolerated treatment well    Behavior During Therapy  [facility] for tasks assessed/performed       Past Medical History:  Diagnosis Date  . Allergic rhinitis   . Endometrial hyperplasia   . Endometriosis   . Family history of breast cancer   . Family history of breast cancer   . Hx of endometriosis    rectal vag with laser surgery   . Seasonal allergies   . 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 DIAGNOSTIC LAPAROSCOPY    . MASTECTOMY W/ SENTINEL NODE BIOPSY Bilateral 09/16/2018   Procedure: RIGHT MASTECTOMY WITH RIGHT AXILLARY SENTINEL LYMPH NODE BIOPSY AND LEFT RISK REDUCING MASTECTOMY;  Surgeon: Rolm Bookbinder, MD;  Location: [place];  Service: General;  Laterality: Bilateral;    There were no vitals filed for this visit.  Subjective Assessment - 12/10/18 1136    Subjective  My sciatica is back. My chest is feeling better. I got the foam roll yesterday afternoon.    Pertinent History  R breast cancer with bilateral mastectomy on 09/16/18 and SLNB (2 nodes both negative), does not need chemo or radiation, endometriosis    Patient Stated Goals  to resume normal functions, drive again, typing, get ROM back    Currently in Pain?  Yes    Pain Score  5     Pain Location  Buttocks    Pain Orientation  Right    Pain Descriptors / Indicators  Radiating    Pain Type  Acute pain    Pain Radiating Towards  down to calf                       [facility] Adult PT Treatment/Exercise - 12/10/18 0001      Exercises   Other Exercises   reinstruted pt in how to lie over supine foam roll with arms outstretched for pec stretch since pt purchased foam roll      Manual Therapy   Soft tissue mobilization  using biotone in left sidelying to right piriformis muscle, tightness was palpable but released with massage    Manual Lymphatic Drainage (MLD)   briefly in supine: 5 diagmatic breaths, short neck, left inguinal nodes and establishment of axillo inguinal pathway, L lateral trunk moving fluid towards pathways then repeated on opposite side                  PT Long Term Goals - 11/28/18 1134      PT LONG TERM GOAL #1   Title  Pt will demonstrate 160 degrees of bilateral shoulder flexion to allow her to reach overhead.    Baseline  R 115, L 105, 10/31/18- R 160 L 156, 820/20-  R 166 L 162    Time  4    Period  Weeks    Status  Achieved      PT LONG TERM GOAL #2   Title  Pt will demonstrate 160 degrees of bilateral shoulder abduction to allow her to reach out to the side.    Baseline  R 91, L 77, 10/31/18- R 175 L 180    Time  4    Period  Weeks    Status  Achieved      PT LONG TERM GOAL #3   Title  Pt will be independent in a home exercise program for continued strengthening and stretching    Time  4    Period  Weeks    Status  On-going      PT LONG TERM GOAL #4   Title  Pt will report a 75% improvement in pain across chest to allow her to return to prior level of function.    Baseline  10/31/18- 75% improvement    Time  4    Period  Weeks    Status  Achieved      PT LONG TERM GOAL #5   Title  Pt will report a 75% improvement in swelling in bilateral lateral trunk and inferior axilla to allow improved comfort.    Baseline  10/31/18- 60% improvement, 11/28/18- pt has developed  swelling over her vacation- so no improvement as of now    Time  4    Period  Weeks    Status  On-going      PT LONG TERM GOAL #6   Title  Pt will experience no pain from sciatica in RLE during the day to allow pt to return to PLOF    Baseline  10/31/18- currently pain is between 5 and 6/10, 11/28/18- 4 or 5/10    Time  4    Period  Weeks    Status  On-going            Plan - 12/10/18 1220    Clinical Impression Statement  Pt had another flare up of sciatic pain. Her piriformis was very tight to the touch so focused on soft tissue mobilization to this area today. Instructed pt not to sit in lotus position on floor for long periods as this may be irritating it. Also performed MLD to bilateral trunk briefly since pt felt she was having some swelling. Her chest tightness has improved from last session.    Stability/Clinical Decision Making  Evolving/Moderate complexity    Rehab Potential  Excellent    PT Frequency  2x / week    PT Duration  4 weeks    PT Treatment/Interventions  ADLs/Self Care Home Management;Therapeutic activities;Therapeutic exercise;Patient/family education;Manual techniques;Manual lymph drainage;Scar mobilization;Passive range of motion;Taping;Joint Manipulations    PT Next Visit Plan  STM as needed to piriformis, assess indep with Strength ABC, STM to bilateral pecs, scar massage once steri strips come off, with Continue PROM to bilateral shoulders to pt's tolerance    PT Home Exercise Plan  post op breast exercises, supine cane exercises, supine scap, Strength ABC program    Consulted and Agree with Plan of Care  Patient       Patient will benefit from skilled therapeutic intervention in order to improve the following deficits and impairments:  Decreased knowledge of precautions, Decreased range of motion, Decreased scar mobility, Increased edema, Decreased strength, Increased fascial restricitons, Pain, Postural dysfunction  Visit Diagnosis: Aftercare following  surgery for  neoplasm  Pain in right leg  Abnormal posture     Problem List Patient Active Problem List   Diagnosis Date Noted  . Breast cancer, right ([place]) 09/16/2018  . Genetic testing 09/09/2018  . Malignant neoplasm of upper-inner quadrant of right breast in female, estrogen receptor positive ([place]) 08/19/2018  . Family history of breast cancer   . History of penicillin allergy 09/24/2012  . Exposure to strep throat 09/24/2012  . Perioral dermatitis 09/24/2012  . Left otitis media with effusion 08/16/2011  . Hx of antibiotic allergy 05/03/2011  . Unspecified disorder of the teeth and supporting structures 03/30/2008  . ALLERGIC RHINITIS 02/17/2008  . ENDOMETRIOSIS 02/17/2008  . HEADACHE 02/17/2008  . Hx gestational diabetes 02/17/2008  . POSITIVE PPD 12/17/2007    Allyson Sabal [facility] 12/10/2018, 12:22 PM  [place], [place], [address] Phone: [phone]   Fax:  [phone]  Name: Megan Armstrong MRN: [id number] Date of Birth: 06/21/1965  Manus Gunning, PT 12/10/18 12:22 PM

## 2018-12-12 ENCOUNTER — Ambulatory Visit: Payer: Managed Care, Other (non HMO) | Admitting: Physical Therapy

## 2018-12-12 ENCOUNTER — Other Ambulatory Visit: Payer: Self-pay

## 2018-12-12 ENCOUNTER — Encounter: Payer: Self-pay | Admitting: Physical Therapy

## 2018-12-12 DIAGNOSIS — Z483 Aftercare following surgery for neoplasm: Secondary | ICD-10-CM | POA: Diagnosis not present

## 2018-12-12 DIAGNOSIS — M79604 Pain in right leg: Secondary | ICD-10-CM

## 2018-12-12 DIAGNOSIS — R600 Localized edema: Secondary | ICD-10-CM

## 2018-12-12 NOTE — Therapy (Signed)
[place], [place], [address] Phone: [phone]   Fax:  [phone]  Physical Therapy Treatment  Patient Details  Name: Megan Armstrong MRN: [id number] Date of Birth: [birth year] Referring Provider (PT): Ave Filter Date: 12/12/2018  PT End of Session - 12/12/18 1218    Visit Number  17    Number of Visits  17    Date for PT Re-Evaluation  12/26/18    PT Start Time  1136    PT Stop Time  1218    PT Time Calculation (min)  42 min    Activity Tolerance  Patient tolerated treatment well    Behavior During Therapy  [facility] for tasks assessed/performed       Past Medical History:  Diagnosis Date  . Allergic rhinitis   . Endometrial hyperplasia   . Endometriosis   . Family history of breast cancer   . Family history of breast cancer   . Hx of endometriosis    rectal vag with laser surgery   . Seasonal allergies   . 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 DIAGNOSTIC LAPAROSCOPY    . MASTECTOMY W/ SENTINEL NODE BIOPSY Bilateral 09/16/2018   Procedure: RIGHT MASTECTOMY WITH RIGHT AXILLARY SENTINEL LYMPH NODE BIOPSY AND LEFT RISK REDUCING MASTECTOMY;  Surgeon: Rolm Bookbinder, MD;  Location: [place];  Service: General;  Laterality: Bilateral;    There were no vitals filed for this visit.  Subjective Assessment - 12/12/18 1140    Subjective  No pressure across my chest. I had more swelling under my right axilla. I feel like we are pretty much where we were on Tuesday. My sciatica is still there and it is better but it is is not gone. The pain does not go as far down my leg.    Pertinent History  R breast cancer with bilateral mastectomy on 09/16/18 and SLNB (2 nodes both negative), does not need chemo or radiation, endometriosis    Patient Stated Goals  to resume normal  functions, drive again, typing, get ROM back    Currently in Pain?  Yes    Pain Score  3     Pain Location  Buttocks    Pain Orientation  Right    Pain Descriptors / Indicators  Radiating                       [facility] Adult PT Treatment/Exercise - 12/12/18 0001      Manual Therapy   Soft tissue mobilization   in left sidelying to right piriformis muscle, tightness was palpable but released with massage    Manual Lymphatic Drainage (MLD)  in supine: 5 diagmatic breaths, short neck, right inguinal nodes and establishment of axillo inguinal pathway, R lateral trunk with focus on area of increased swelling at lateral mastectomy scar, moving fluid towards pathways                  PT Long Term Goals - 11/28/18 1134      PT LONG TERM GOAL #1   Title  Pt will demonstrate 160 degrees of bilateral shoulder flexion to allow her to reach overhead.    Baseline  R 115, L 105, 10/31/18- R 160 L 156, 820/20- R 166 L 162    Time  4  Period  Weeks    Status  Achieved      PT LONG TERM GOAL #2   Title  Pt will demonstrate 160 degrees of bilateral shoulder abduction to allow her to reach out to the side.    Baseline  R 91, L 77, 10/31/18- R 175 L 180    Time  4    Period  Weeks    Status  Achieved      PT LONG TERM GOAL #3   Title  Pt will be independent in a home exercise program for continued strengthening and stretching    Time  4    Period  Weeks    Status  On-going      PT LONG TERM GOAL #4   Title  Pt will report a 75% improvement in pain across chest to allow her to return to prior level of function.    Baseline  10/31/18- 75% improvement    Time  4    Period  Weeks    Status  Achieved      PT LONG TERM GOAL #5   Title  Pt will report a 75% improvement in swelling in bilateral lateral trunk and inferior axilla to allow improved comfort.    Baseline  10/31/18- 60% improvement, 11/28/18- pt has developed swelling over her vacation- so no improvement as of now     Time  4    Period  Weeks    Status  On-going      PT LONG TERM GOAL #6   Title  Pt will experience no pain from sciatica in RLE during the day to allow pt to return to PLOF    Baseline  10/31/18- currently pain is between 5 and 6/10, 11/28/18- 4 or 5/10    Time  4    Period  Weeks    Status  On-going            Plan - 12/12/18 1222    Clinical Impression Statement  Continued to work on piriformis today since pt is still having pain though it is becoming more centralized. Sent pt a link to a video that demonstrates how to stretch piriformis using foam roll since pt recently purchased one. Worked on MLD especially to right lateral mastectomy scar in area of swelling.    PT Frequency  2x / week    PT Duration  4 weeks    PT Treatment/Interventions  ADLs/Self Care Home Management;Therapeutic activities;Therapeutic exercise;Patient/family education;Manual techniques;Manual lymph drainage;Scar mobilization;Passive range of motion;Taping;Joint Manipulations    PT Next Visit Plan  STM as needed to piriformis, assess indep with Strength ABC, STM to bilateral pecs, scar massage once steri strips come off, with Continue PROM to bilateral shoulders to pt's tolerance    PT Home Exercise Plan  post op breast exercises, supine cane exercises, supine scap, Strength ABC program    Consulted and Agree with Plan of Care  Patient       Patient will benefit from skilled therapeutic intervention in order to improve the following deficits and impairments:  Decreased knowledge of precautions, Decreased range of motion, Decreased scar mobility, Increased edema, Decreased strength, Increased fascial restricitons, Pain, Postural dysfunction  Visit Diagnosis: Pain in right leg  Localized edema     Problem List Patient Active Problem List   Diagnosis Date Noted  . Breast cancer, right ([place]) 09/16/2018  . Genetic testing 09/09/2018  . Malignant neoplasm of upper-inner quadrant of right breast in  female, estrogen receptor positive (  [place]) 08/19/2018  . Family history of breast cancer   . History of penicillin allergy 09/24/2012  . Exposure to strep throat 09/24/2012  . Perioral dermatitis 09/24/2012  . Left otitis media with effusion 08/16/2011  . Hx of antibiotic allergy 05/03/2011  . Unspecified disorder of the teeth and supporting structures 03/30/2008  . ALLERGIC RHINITIS 02/17/2008  . ENDOMETRIOSIS 02/17/2008  . HEADACHE 02/17/2008  . Hx gestational diabetes 02/17/2008  . POSITIVE PPD 12/17/2007    Allyson Sabal [facility] 12/12/2018, 12:24 PM  [place], [place], [address] Phone: [phone]   Fax:  [phone]  Name: Jakeisha Brickell MRN: [id number] Date of Birth: [birth year]  Manus Gunning, PT 12/12/18 12:24 PM

## 2018-12-20 ENCOUNTER — Encounter

## 2018-12-23 ENCOUNTER — Inpatient Hospital Stay: Payer: Managed Care, Other (non HMO) | Attending: Hematology and Oncology | Admitting: Adult Health

## 2018-12-23 DIAGNOSIS — C50211 Malignant neoplasm of upper-inner quadrant of right female breast: Secondary | ICD-10-CM

## 2018-12-23 DIAGNOSIS — Z170 Estrogen receptor positive status [ER+]: Secondary | ICD-10-CM

## 2018-12-23 NOTE — Progress Notes (Signed)
Was delayed by 12 minutes to appointment and at that point patient was not on video visit.  I waited for 20 minutes while my nurse attempted to reach out.  Patient to reschedule.    Wilber Bihari, NP

## 2018-12-24 ENCOUNTER — Ambulatory Visit: Payer: Managed Care, Other (non HMO) | Admitting: Physical Therapy

## 2018-12-24 ENCOUNTER — Encounter: Payer: Self-pay | Admitting: Physical Therapy

## 2018-12-24 DIAGNOSIS — R293 Abnormal posture: Secondary | ICD-10-CM

## 2018-12-24 DIAGNOSIS — R600 Localized edema: Secondary | ICD-10-CM

## 2018-12-24 DIAGNOSIS — Z483 Aftercare following surgery for neoplasm: Secondary | ICD-10-CM | POA: Diagnosis not present

## 2018-12-24 NOTE — Therapy (Signed)
[place], [place], [address] Phone: [phone]   Fax:  [phone]  Physical Therapy Treatment  Patient Details  Name: Megan Armstrong MRN: [id number] Date of Birth: [birth year] Referring Provider (PT): Donne Hazel   Encounter Date: 12/24/2018  PT End of Session - 12/24/18 1018    Visit Number  19    Number of Visits  17    Date for PT Re-Evaluation  12/26/18    PT Start Time  0933    PT Stop Time  1018    PT Time Calculation (min)  45 min    Activity Tolerance  Patient tolerated treatment well    Behavior During Therapy  [facility] for tasks assessed/performed       Past Medical History:  Diagnosis Date  . Allergic rhinitis   . Endometrial hyperplasia   . Endometriosis   . Family history of breast cancer   . Family history of breast cancer   . Hx of endometriosis    rectal vag with laser surgery   . Seasonal allergies   . 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 DIAGNOSTIC LAPAROSCOPY    . MASTECTOMY W/ SENTINEL NODE BIOPSY Bilateral 09/16/2018   Procedure: RIGHT MASTECTOMY WITH RIGHT AXILLARY SENTINEL LYMPH NODE BIOPSY AND LEFT RISK REDUCING MASTECTOMY;  Surgeon: Rolm Bookbinder, MD;  Location: [place];  Service: General;  Laterality: Bilateral;    There were no vitals filed for this visit.  Subjective Assessment - 12/24/18 0934    Subjective  I have been pretty stable. I have started walking again like I used to before surgery. I walk between 3 and 5 miles and I think that has really helped the sciatica. I would like you to look at the underarms for swelling. I still have some tightness across the chest.    Pertinent History  R breast cancer with bilateral mastectomy on 09/16/18 and SLNB (2 nodes both negative), does not need chemo or radiation, endometriosis    Patient  Stated Goals  to resume normal functions, drive again, typing, get ROM back    Currently in Pain?  Yes    Pain Score  1     Pain Location  Axilla    Pain Orientation  Right;Left    Pain Descriptors / Indicators  Discomfort    Pain Type  Surgical pain                       [facility] Adult PT Treatment/Exercise - 12/24/18 0001      Manual Therapy   Soft tissue mobilization  with pt lying over foam roll performed soft tissue massage across bilateral pecs to help decrease tightness using biotone    Manual Lymphatic Drainage (MLD)  in supine: 5 diagmatic breaths, short neck, left inguinal nodes and establishment of axillo inguinal pathway, L lateral trunk with focus on area of increased swelling at lateral mastectomy scar, moving fluid towards pathways                  PT Long Term Goals - 11/28/18 1134      PT LONG TERM GOAL #1   Title  Pt will demonstrate 160 degrees of bilateral shoulder flexion to allow her to reach overhead.    Baseline  R 115, L 105, 10/31/18- R 160 L 156, 820/20-  R 166 L 162    Time  4    Period  Weeks    Status  Achieved      PT LONG TERM GOAL #2   Title  Pt will demonstrate 160 degrees of bilateral shoulder abduction to allow her to reach out to the side.    Baseline  R 91, L 77, 10/31/18- R 175 L 180    Time  4    Period  Weeks    Status  Achieved      PT LONG TERM GOAL #3   Title  Pt will be independent in a home exercise program for continued strengthening and stretching    Time  4    Period  Weeks    Status  On-going      PT LONG TERM GOAL #4   Title  Pt will report a 75% improvement in pain across chest to allow her to return to prior level of function.    Baseline  10/31/18- 75% improvement    Time  4    Period  Weeks    Status  Achieved      PT LONG TERM GOAL #5   Title  Pt will report a 75% improvement in swelling in bilateral lateral trunk and inferior axilla to allow improved comfort.    Baseline  10/31/18- 60%  improvement, 11/28/18- pt has developed swelling over her vacation- so no improvement as of now    Time  4    Period  Weeks    Status  On-going      PT LONG TERM GOAL #6   Title  Pt will experience no pain from sciatica in RLE during the day to allow pt to return to PLOF    Baseline  10/31/18- currently pain is between 5 and 6/10, 11/28/18- 4 or 5/10    Time  4    Period  Weeks    Status  On-going            Plan - 12/24/18 1019    Clinical Impression Statement  Pt has started walking more over the last week and has not had any more piriformis pain. Performed MLD to left lateral trunk because she did have some fullness inferior to left axilla. She is still having some tightness across her chest so performd soft tissue mobilization to this area to improve tightness.    Stability/Clinical Decision Making  Evolving/Moderate complexity    Rehab Potential  Excellent    PT Frequency  2x / week    PT Duration  4 weeks    PT Treatment/Interventions  ADLs/Self Care Home Management;Therapeutic activities;Therapeutic exercise;Patient/family education;Manual techniques;Manual lymph drainage;Scar mobilization;Passive range of motion;Taping;Joint Manipulations    PT Next Visit Plan  reassess or d/c, STM as needed to piriformis, assess indep with Strength ABC, STM to bilateral pecs, scar massage once steri strips come off, with Continue PROM to bilateral shoulders to pt's tolerance    PT Home Exercise Plan  post op breast exercises, supine cane exercises, supine scap, Strength ABC program    Consulted and Agree with Plan of Care  Patient       Patient will benefit from skilled therapeutic intervention in order to improve the following deficits and impairments:  Decreased knowledge of precautions, Decreased range of motion, Decreased scar mobility, Increased edema, Decreased strength, Increased fascial restricitons, Pain, Postural dysfunction  Visit Diagnosis: Localized edema  Aftercare following  surgery for neoplasm  Abnormal posture     Problem  List Patient Active Problem List   Diagnosis Date Noted  . Breast cancer, right ([place]) 09/16/2018  . Genetic testing 09/09/2018  . Malignant neoplasm of upper-inner quadrant of right breast in female, estrogen receptor positive ([place]) 08/19/2018  . Family history of breast cancer   . History of penicillin allergy 09/24/2012  . Exposure to strep throat 09/24/2012  . Perioral dermatitis 09/24/2012  . Left otitis media with effusion 08/16/2011  . Hx of antibiotic allergy 05/03/2011  . Unspecified disorder of the teeth and supporting structures 03/30/2008  . ALLERGIC RHINITIS 02/17/2008  . ENDOMETRIOSIS 02/17/2008  . HEADACHE 02/17/2008  . Hx gestational diabetes 02/17/2008  . POSITIVE PPD 12/17/2007    Allyson Sabal [facility] 12/24/2018, 10:21 AM  [place], [place], [address] Phone: [phone]   Fax:  [phone]  Name: Megan Armstrong MRN: [id number] Date of Birth: 1965/11/21  Manus Gunning, PT 12/24/18 10:21 AM

## 2018-12-26 ENCOUNTER — Other Ambulatory Visit: Payer: Self-pay

## 2018-12-26 ENCOUNTER — Encounter: Payer: Self-pay | Admitting: Physical Therapy

## 2018-12-26 ENCOUNTER — Ambulatory Visit: Payer: Managed Care, Other (non HMO) | Admitting: Physical Therapy

## 2018-12-26 DIAGNOSIS — R600 Localized edema: Secondary | ICD-10-CM

## 2018-12-26 DIAGNOSIS — Z483 Aftercare following surgery for neoplasm: Secondary | ICD-10-CM | POA: Diagnosis not present

## 2018-12-26 DIAGNOSIS — R293 Abnormal posture: Secondary | ICD-10-CM

## 2018-12-26 NOTE — Therapy (Signed)
[place], [place], [address] Phone: [phone]   Fax:  [phone]  Physical Therapy Treatment  Patient Details  Name: Megan Armstrong MRN: [id number] Date of Birth: 04/05/1966 Referring Provider (PT): Ave Filter Date: 12/26/2018  PT End of Session - 12/26/18 1221    Visit Number  20    Number of Visits  17    Date for PT Re-Evaluation  12/26/18    PT Start Time  1138    PT Stop Time  1222    PT Time Calculation (min)  44 min    Activity Tolerance  Patient tolerated treatment well    Behavior During Therapy  [facility] for tasks assessed/performed       Past Medical History:  Diagnosis Date  . Allergic rhinitis   . Endometrial hyperplasia   . Endometriosis   . Family history of breast cancer   . Family history of breast cancer   . Hx of endometriosis    rectal vag with laser surgery   . Seasonal allergies   . 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 DIAGNOSTIC LAPAROSCOPY    . MASTECTOMY W/ SENTINEL NODE BIOPSY Bilateral 09/16/2018   Procedure: RIGHT MASTECTOMY WITH RIGHT AXILLARY SENTINEL LYMPH NODE BIOPSY AND LEFT RISK REDUCING MASTECTOMY;  Surgeon: Rolm Bookbinder, MD;  Location: [place];  Service: General;  Laterality: Bilateral;    There were no vitals filed for this visit.  Subjective Assessment - 12/26/18 1140    Subjective  Piriformis is fine. I walked again yesterday and I think that is really helping. I still have some tightness across the chest and some soreness under my arms.    Pertinent History  R breast cancer with bilateral mastectomy on 09/16/18 and SLNB (2 nodes both negative), does not need chemo or radiation, endometriosis    Patient Stated Goals  to resume normal functions, drive again, typing, get ROM back    Currently in Pain?  Yes    Pain  Score  1     Pain Location  Axilla    Pain Orientation  Right;Left    Pain Descriptors / Indicators  Discomfort                       [facility] Adult PT Treatment/Exercise - 12/26/18 0001      Manual Therapy   Soft tissue mobilization  with pt lying over foam roll performed soft tissue massage across bilateral pecs to help decrease tightness    Manual Lymphatic Drainage (MLD)  in supine: 5 diagmatic breaths, short neck, left inguinal nodes and establishment of axillo inguinal pathway, L lateral trunk with focus on area of increased swelling at lateral mastectomy scar, moving fluid towards pathways                  PT Long Term Goals - 11/28/18 1134      PT LONG TERM GOAL #1   Title  Pt will demonstrate 160 degrees of bilateral shoulder flexion to allow her to reach overhead.    Baseline  R 115, L 105, 10/31/18- R 160 L 156, 820/20- R 166 L 162    Time  4    Period  Weeks    Status  Achieved      PT LONG TERM GOAL #2  Title  Pt will demonstrate 160 degrees of bilateral shoulder abduction to allow her to reach out to the side.    Baseline  R 91, L 77, 10/31/18- R 175 L 180    Time  4    Period  Weeks    Status  Achieved      PT LONG TERM GOAL #3   Title  Pt will be independent in a home exercise program for continued strengthening and stretching    Time  4    Period  Weeks    Status  On-going      PT LONG TERM GOAL #4   Title  Pt will report a 75% improvement in pain across chest to allow her to return to prior level of function.    Baseline  10/31/18- 75% improvement    Time  4    Period  Weeks    Status  Achieved      PT LONG TERM GOAL #5   Title  Pt will report a 75% improvement in swelling in bilateral lateral trunk and inferior axilla to allow improved comfort.    Baseline  10/31/18- 60% improvement, 11/28/18- pt has developed swelling over her vacation- so no improvement as of now    Time  4    Period  Weeks    Status  On-going      PT LONG  TERM GOAL #6   Title  Pt will experience no pain from sciatica in RLE during the day to allow pt to return to PLOF    Baseline  10/31/18- currently pain is between 5 and 6/10, 11/28/18- 4 or 5/10    Time  4    Period  Weeks    Status  On-going            Plan - 12/26/18 1222    Clinical Impression Statement  Pt's piriformis pain has been improved since she began walking more. She is still having some discomfort in bilateral axilla from mild swelling in this area. Continued STM across bilateral chest to ease tightness. Will decide next visit whether to discharge or continue at a reduced frequency.    Rehab Potential  Excellent    PT Frequency  2x / week    PT Duration  4 weeks    PT Treatment/Interventions  ADLs/Self Care Home Management;Therapeutic activities;Therapeutic exercise;Patient/family education;Manual techniques;Manual lymph drainage;Scar mobilization;Passive range of motion;Taping;Joint Manipulations    PT Next Visit Plan  reassess or d/c, STM as needed to piriformis, assess indep with Strength ABC, STM to bilateral pecs, scar massage once steri strips come off, with Continue PROM to bilateral shoulders to pt's tolerance    PT Home Exercise Plan  post op breast exercises, supine cane exercises, supine scap, Strength ABC program    Consulted and Agree with Plan of Care  Patient       Patient will benefit from skilled therapeutic intervention in order to improve the following deficits and impairments:  Decreased knowledge of precautions, Decreased range of motion, Decreased scar mobility, Increased edema, Decreased strength, Increased fascial restricitons, Pain, Postural dysfunction  Visit Diagnosis: Localized edema  Aftercare following surgery for neoplasm  Abnormal posture     Problem List Patient Active Problem List   Diagnosis Date Noted  . Breast cancer, right ([place]) 09/16/2018  . Genetic testing 09/09/2018  . Malignant neoplasm of upper-inner quadrant of right  breast in female, estrogen receptor positive ([place]) 08/19/2018  . Family history of breast cancer   .  History of penicillin allergy 09/24/2012  . Exposure to strep throat 09/24/2012  . Perioral dermatitis 09/24/2012  . Left otitis media with effusion 08/16/2011  . Hx of antibiotic allergy 05/03/2011  . Unspecified disorder of the teeth and supporting structures 03/30/2008  . ALLERGIC RHINITIS 02/17/2008  . ENDOMETRIOSIS 02/17/2008  . HEADACHE 02/17/2008  . Hx gestational diabetes 02/17/2008  . POSITIVE PPD 12/17/2007    Allyson Sabal [facility] 12/26/2018, 12:24 PM  [place], [place], [address] Phone: [phone]   Fax:  [phone]  Name: Megan Armstrong MRN: [id number] Date of Birth: 16-Apr-1965  Manus Gunning, PT 12/26/18 12:24 PM

## 2018-12-31 ENCOUNTER — Other Ambulatory Visit: Payer: Self-pay

## 2018-12-31 ENCOUNTER — Encounter: Payer: Self-pay | Admitting: Physical Therapy

## 2018-12-31 ENCOUNTER — Ambulatory Visit: Payer: Managed Care, Other (non HMO) | Admitting: Physical Therapy

## 2018-12-31 DIAGNOSIS — M25612 Stiffness of left shoulder, not elsewhere classified: Secondary | ICD-10-CM

## 2018-12-31 DIAGNOSIS — M25512 Pain in left shoulder: Secondary | ICD-10-CM

## 2018-12-31 DIAGNOSIS — M6281 Muscle weakness (generalized): Secondary | ICD-10-CM

## 2018-12-31 DIAGNOSIS — Z483 Aftercare following surgery for neoplasm: Secondary | ICD-10-CM

## 2018-12-31 DIAGNOSIS — R293 Abnormal posture: Secondary | ICD-10-CM

## 2018-12-31 DIAGNOSIS — M25511 Pain in right shoulder: Secondary | ICD-10-CM

## 2018-12-31 DIAGNOSIS — R600 Localized edema: Secondary | ICD-10-CM

## 2018-12-31 DIAGNOSIS — M25611 Stiffness of right shoulder, not elsewhere classified: Secondary | ICD-10-CM

## 2018-12-31 DIAGNOSIS — M79604 Pain in right leg: Secondary | ICD-10-CM

## 2018-12-31 NOTE — Therapy (Signed)
[place], [place], [address] Phone: [phone]   Fax:  [phone]  Physical Therapy Treatment  Patient Details  Name: Megan Armstrong MRN: [id number] Date of Birth: 24-Jun-1965 Referring Provider (PT): Donne Hazel   Encounter Date: 12/31/2018  PT End of Session - 12/31/18 1018    Visit Number  21    Number of Visits  26    Date for PT Re-Evaluation  01/28/19    PT Start Time  [id number]   pt arrived late   PT Stop Time  1018    PT Time Calculation (min)  35 min    Activity Tolerance  Patient tolerated treatment well    Behavior During Therapy  [facility] for tasks assessed/performed       Past Medical History:  Diagnosis Date  . Allergic rhinitis   . Endometrial hyperplasia   . Endometriosis   . Family history of breast cancer   . Family history of breast cancer   . Hx of endometriosis    rectal vag with laser surgery   . Seasonal allergies   . 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 DIAGNOSTIC LAPAROSCOPY    . MASTECTOMY W/ SENTINEL NODE BIOPSY Bilateral 09/16/2018   Procedure: RIGHT MASTECTOMY WITH RIGHT AXILLARY SENTINEL LYMPH NODE BIOPSY AND LEFT RISK REDUCING MASTECTOMY;  Surgeon: Rolm Bookbinder, MD;  Location: [place];  Service: General;  Laterality: Bilateral;    There were no vitals filed for this visit.  Subjective Assessment - 12/31/18 0946    Subjective  My piriformis pain is gone. The tightness is release but the arms are still a little bit tight. Across the chest is relieving but under the arms remains.    Pertinent History  R breast cancer with bilateral mastectomy on 09/16/18 and SLNB (2 nodes both negative), does not need chemo or radiation, endometriosis    Patient Stated Goals  to resume normal functions, drive again, typing, get ROM back    Currently in Pain?   No/denies    Pain Score  0-No pain                       [facility] Adult PT Treatment/Exercise - 12/31/18 0001      Manual Therapy   Soft tissue mobilization  with pt lying over foam roll performed soft tissue massage across bilateral pecs to help decrease tightness    Manual Lymphatic Drainage (MLD)  in supine: 5 diagmatic breaths, short neck, left inguinal nodes and establishment of axillo inguinal pathway, L lateral trunk with focus on area of increased swelling at lateral mastectomy scar, moving fluid towards pathways                  PT Long Term Goals - 12/31/18 0947      PT LONG TERM GOAL #1   Title  Pt will demonstrate 160 degrees of bilateral shoulder flexion to allow her to reach overhead.    Baseline  R 115, L 105, 10/31/18- R 160 L 156, 820/20- R 166 L 162    Period  Weeks    Status  Achieved      PT LONG TERM GOAL #2   Title  Pt will demonstrate 160 degrees of bilateral shoulder abduction to allow her to reach out to the side.    Baseline  R 91, L 77, 10/31/18- R 175 L 180    Time  4    Period  Weeks    Status  Achieved      PT LONG TERM GOAL #3   Title  Pt will be independent in a home exercise program for continued strengthening and stretching    Baseline  12/31/18- pt is independent and obtained a foam roller for stretches at home    Time  4    Period  Weeks    Status  Achieved      PT LONG TERM GOAL #4   Title  Pt will report a 75% improvement in pain across chest to allow her to return to prior level of function.    Baseline  10/31/18- 75% improvement    Time  4    Period  Weeks    Status  Achieved      PT LONG TERM GOAL #5   Title  Pt will report a 75% improvement in swelling in bilateral lateral trunk and inferior axilla to allow improved comfort.    Baseline  10/31/18- 60% improvement, 11/28/18- pt has developed swelling over her vacation- so no improvement as of now, 12/31/18- 40% improved    Time  4    Period  Weeks      PT LONG  TERM GOAL #6   Title  Pt will experience no pain from sciatica in RLE during the day to allow pt to return to PLOF    Baseline  10/31/18- currently pain is between 5 and 6/10, 11/28/18- 4 or 5/10, 12/31/18- pt is no longer having pain    Time  4    Period  Weeks    Status  Achieved            Plan - 12/31/18 1019    Clinical Impression Statement  Recert done today. Assessed pt's progress towards goals in therapy. She is still having some axillary swelling bilaterally and tightness across her chest that is improving but it still bothering her. Will decrease to 1x/wk after this week. Pt would benefit from additional skilled PT services to address these problems.    Rehab Potential  Excellent    PT Frequency  2x / week   then decreasing to 1x/wk for 3 weeks   PT Duration  4 weeks    PT Treatment/Interventions  ADLs/Self Care Home Management;Therapeutic activities;Therapeutic exercise;Patient/family education;Manual techniques;Manual lymph drainage;Scar mobilization;Passive range of motion;Taping;Joint Manipulations    PT Next Visit Plan  STM to bilateral pecs, scar massage, with Continue PROM to bilateral shoulders to pt's tolerance    PT Home Exercise Plan  post op breast exercises, supine cane exercises, supine scap, Strength ABC program    Consulted and Agree with Plan of Care  Patient       Patient will benefit from skilled therapeutic intervention in order to improve the following deficits and impairments:  Decreased knowledge of precautions, Decreased range of motion, Decreased scar mobility, Increased edema, Decreased strength, Increased fascial restricitons, Pain, Postural dysfunction  Visit Diagnosis: Localized edema  Aftercare following surgery for neoplasm  Abnormal posture     Problem List Patient Active Problem List   Diagnosis Date Noted  . Breast cancer, right ([place]) 09/16/2018  . Genetic testing 09/09/2018  . Malignant neoplasm of upper-inner quadrant of right  breast in female, estrogen receptor positive ([place]) 08/19/2018  . Family history of breast cancer   . History of penicillin allergy 09/24/2012  . Exposure to strep  throat 09/24/2012  . Perioral dermatitis 09/24/2012  . Left otitis media with effusion 08/16/2011  . Hx of antibiotic allergy 05/03/2011  . Unspecified disorder of the teeth and supporting structures 03/30/2008  . ALLERGIC RHINITIS 02/17/2008  . ENDOMETRIOSIS 02/17/2008  . HEADACHE 02/17/2008  . Hx gestational diabetes 02/17/2008  . POSITIVE PPD 12/17/2007    Allyson Sabal [facility] 12/31/2018, 10:26 AM  [place], [place], [address] Phone: [phone]   Fax:  [phone]  Name: Megan Armstrong MRN: [id number] Date of Birth: [birth year]  Manus Gunning, PT 12/31/18 10:26 AM

## 2019-01-02 ENCOUNTER — Encounter: Payer: Managed Care, Other (non HMO) | Admitting: Physical Therapy

## 2019-01-04 ENCOUNTER — Other Ambulatory Visit: Payer: Self-pay

## 2019-01-04 ENCOUNTER — Ambulatory Visit: Payer: Managed Care, Other (non HMO)

## 2019-01-04 ENCOUNTER — Ambulatory Visit (INDEPENDENT_AMBULATORY_CARE_PROVIDER_SITE_OTHER): Payer: Managed Care, Other (non HMO)

## 2019-01-04 DIAGNOSIS — Z23 Encounter for immunization: Secondary | ICD-10-CM | POA: Diagnosis not present

## 2019-01-07 ENCOUNTER — Encounter: Payer: Managed Care, Other (non HMO) | Admitting: Physical Therapy

## 2019-01-07 ENCOUNTER — Telehealth: Payer: Self-pay | Admitting: Adult Health

## 2019-01-07 ENCOUNTER — Telehealth: Payer: Self-pay

## 2019-01-07 NOTE — Telephone Encounter (Signed)
Entered by Wilber Bihari C on 01/07/2019 at 8:38 AM  Priority: Routine  <No visit type provided>  Department: [facility]  Provider: Gardenia Phlegm, NP  Appointment Notes:  10/16 1pm Mychart video SCP, I already confirmed date and time with patient, no need to call

## 2019-01-07 NOTE — Telephone Encounter (Signed)
RN returned call regarding scheduling appointment.  Left voicemail informing pt scheduling staff would be notified.  Scheduling message sent.

## 2019-01-09 ENCOUNTER — Other Ambulatory Visit: Payer: Self-pay

## 2019-01-09 ENCOUNTER — Ambulatory Visit: Payer: Managed Care, Other (non HMO) | Attending: General Surgery | Admitting: Physical Therapy

## 2019-01-09 ENCOUNTER — Encounter: Payer: Self-pay | Admitting: Physical Therapy

## 2019-01-09 DIAGNOSIS — M79604 Pain in right leg: Secondary | ICD-10-CM | POA: Insufficient documentation

## 2019-01-09 DIAGNOSIS — R293 Abnormal posture: Secondary | ICD-10-CM | POA: Insufficient documentation

## 2019-01-09 DIAGNOSIS — Z483 Aftercare following surgery for neoplasm: Secondary | ICD-10-CM

## 2019-01-09 DIAGNOSIS — R600 Localized edema: Secondary | ICD-10-CM | POA: Insufficient documentation

## 2019-01-09 DIAGNOSIS — M6281 Muscle weakness (generalized): Secondary | ICD-10-CM | POA: Insufficient documentation

## 2019-01-09 DIAGNOSIS — M25612 Stiffness of left shoulder, not elsewhere classified: Secondary | ICD-10-CM | POA: Insufficient documentation

## 2019-01-09 DIAGNOSIS — M25611 Stiffness of right shoulder, not elsewhere classified: Secondary | ICD-10-CM | POA: Diagnosis present

## 2019-01-09 DIAGNOSIS — M25512 Pain in left shoulder: Secondary | ICD-10-CM | POA: Insufficient documentation

## 2019-01-09 DIAGNOSIS — M25511 Pain in right shoulder: Secondary | ICD-10-CM | POA: Insufficient documentation

## 2019-01-09 NOTE — Therapy (Signed)
[place], [place], [address] Phone: [phone]   Fax:  [phone]  Physical Therapy Treatment  Patient Details  Name: Megan Armstrong MRN: [id number] Date of Birth: 1966/03/11 Referring Provider (PT): Ave Filter Date: 01/09/2019  PT End of Session - 01/09/19 1213    Visit Number  22    Number of Visits  26    Date for PT Re-Evaluation  01/28/19    PT Start Time  [id number]    PT Stop Time  1214    PT Time Calculation (min)  43 min    Activity Tolerance  Patient tolerated treatment well    Behavior During Therapy  [facility] for tasks assessed/performed       Past Medical History:  Diagnosis Date  . Allergic rhinitis   . Endometrial hyperplasia   . Endometriosis   . Family history of breast cancer   . Family history of breast cancer   . Hx of endometriosis    rectal vag with laser surgery   . Seasonal allergies   . 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 DIAGNOSTIC LAPAROSCOPY    . MASTECTOMY W/ SENTINEL NODE BIOPSY Bilateral 09/16/2018   Procedure: RIGHT MASTECTOMY WITH RIGHT AXILLARY SENTINEL LYMPH NODE BIOPSY AND LEFT RISK REDUCING MASTECTOMY;  Surgeon: Rolm Bookbinder, MD;  Location: [facility];  Service: General;  Laterality: Bilateral;    There were no vitals filed for this visit.  Subjective Assessment - 01/09/19 1132    Subjective  I am doing good. I still have some stiffness and swelling under the arm but it is certainly diminishing. The piriformis is still not causing any pain.    Pertinent History  R breast cancer with bilateral mastectomy on 09/16/18 and SLNB (2 nodes both negative), does not need chemo or radiation, endometriosis    Patient Stated Goals  to resume normal functions, drive again, typing, get ROM back    Currently in Pain?  Yes    Pain Score   2     Pain Location  Chest    Pain Orientation  Right;Left    Pain Descriptors / Indicators  Discomfort    Pain Type  Acute pain    Pain Onset  1 to 4 weeks ago                       [facility] Adult PT Treatment/Exercise - 01/09/19 0001      Shoulder Exercises: Supine   Horizontal ABduction  Strengthening;Both;10 reps;Theraband    Theraband Level (Shoulder Horizontal ABduction)  Level 3 (Green)    External Rotation  Strengthening;Both;10 reps;Theraband   v/c to keep elbows at sides   Theraband Level (Shoulder External Rotation)  Level 3 (Green)    Flexion  Strengthening;Both;10 reps;Theraband   narrow/wide grip, v/c for correct technique and to hold stre   Theraband Level (Shoulder Flexion)  Level 3 (Green)    Diagonals  Strengthening;Both;10 reps;Theraband    Theraband Level (Shoulder Diagonals)  Level 3 (Green)      Manual Therapy   Soft tissue mobilization  with pt lying over foam roll performed soft tissue massage across bilateral pecs to help decrease tightness    Manual Lymphatic Drainage (MLD)  in supine: 5 diagmatic breaths, short neck, left inguinal nodes and establishment of axillo inguinal pathway,  L lateral trunk with focus on area of increased swelling at lateral mastectomy scar, moving fluid towards pathways                  PT Long Term Goals - 12/31/18 0947      PT LONG TERM GOAL #1   Title  Pt will demonstrate 160 degrees of bilateral shoulder flexion to allow her to reach overhead.    Baseline  R 115, L 105, 10/31/18- R 160 L 156, 820/20- R 166 L 162    Period  Weeks    Status  Achieved      PT LONG TERM GOAL #2   Title  Pt will demonstrate 160 degrees of bilateral shoulder abduction to allow her to reach out to the side.    Baseline  R 91, L 77, 10/31/18- R 175 L 180    Time  4    Period  Weeks    Status  Achieved      PT LONG TERM GOAL #3   Title  Pt will be independent in a home exercise program for continued strengthening and  stretching    Baseline  12/31/18- pt is independent and obtained a foam roller for stretches at home    Time  4    Period  Weeks    Status  Achieved      PT LONG TERM GOAL #4   Title  Pt will report a 75% improvement in pain across chest to allow her to return to prior level of function.    Baseline  10/31/18- 75% improvement    Time  4    Period  Weeks    Status  Achieved      PT LONG TERM GOAL #5   Title  Pt will report a 75% improvement in swelling in bilateral lateral trunk and inferior axilla to allow improved comfort.    Baseline  10/31/18- 60% improvement, 11/28/18- pt has developed swelling over her vacation- so no improvement as of now, 12/31/18- 40% improved    Time  4    Period  Weeks      PT LONG TERM GOAL #6   Title  Pt will experience no pain from sciatica in RLE during the day to allow pt to return to PLOF    Baseline  10/31/18- currently pain is between 5 and 6/10, 11/28/18- 4 or 5/10, 12/31/18- pt is no longer having pain    Time  4    Period  Weeks    Status  Achieved            Plan - 01/09/19 1213    Clinical Impression Statement  Pt is still having some tightness across chest and minimal swelling under her axillas that is causing discomfort. She reports that she is not feeling much tension with her bands so increased resistance today to green band and assessed pt's form. She required verbal cues to do exercises correctly but did feel better with increased resistance.    Rehab Potential  Excellent    PT Frequency  1x / week    PT Duration  4 weeks    PT Treatment/Interventions  ADLs/Self Care Home Management;Therapeutic activities;Therapeutic exercise;Patient/family education;Manual techniques;Manual lymph drainage;Scar mobilization;Passive range of motion;Taping;Joint Manipulations    PT Next Visit Plan  STM to bilateral pecs, scar massage, with Continue PROM to bilateral shoulders to pt's tolerance    PT Home Exercise Plan  post op breast exercises, supine cane  exercises, supine scap,  Strength ABC program    Consulted and Agree with Plan of Care  Patient       Patient will benefit from skilled therapeutic intervention in order to improve the following deficits and impairments:  Decreased knowledge of precautions, Decreased range of motion, Decreased scar mobility, Increased edema, Decreased strength, Increased fascial restricitons, Pain, Postural dysfunction  Visit Diagnosis: Localized edema  Aftercare following surgery for neoplasm  Abnormal posture     Problem List Patient Active Problem List   Diagnosis Date Noted  . Breast cancer, right ([place]) 09/16/2018  . Genetic testing 09/09/2018  . Malignant neoplasm of upper-inner quadrant of right breast in female, estrogen receptor positive ([place]) 08/19/2018  . Family history of breast cancer   . History of penicillin allergy 09/24/2012  . Exposure to strep throat 09/24/2012  . Perioral dermatitis 09/24/2012  . Left otitis media with effusion 08/16/2011  . Hx of antibiotic allergy 05/03/2011  . Unspecified disorder of the teeth and supporting structures 03/30/2008  . ALLERGIC RHINITIS 02/17/2008  . ENDOMETRIOSIS 02/17/2008  . HEADACHE 02/17/2008  . Hx gestational diabetes 02/17/2008  . POSITIVE PPD 12/17/2007    Allyson Sabal [facility] 01/09/2019, 12:24 PM  [place], [place], [address] Phone: [phone]   Fax:  [phone]  Name: Megan Armstrong MRN: [id number] Date of Birth: 23-Apr-1965  Manus Gunning, PT 01/09/19 12:24 PM

## 2019-01-10 ENCOUNTER — Other Ambulatory Visit: Payer: Self-pay | Admitting: Hematology and Oncology

## 2019-01-16 ENCOUNTER — Ambulatory Visit: Payer: Managed Care, Other (non HMO) | Admitting: Physical Therapy

## 2019-01-16 ENCOUNTER — Encounter: Payer: Self-pay | Admitting: Physical Therapy

## 2019-01-16 ENCOUNTER — Other Ambulatory Visit: Payer: Self-pay

## 2019-01-16 DIAGNOSIS — R600 Localized edema: Secondary | ICD-10-CM

## 2019-01-16 NOTE — Therapy (Signed)
[place], [place], [address] Phone: [phone]   Fax:  [phone]  Physical Therapy Treatment  Patient Details  Name: Megan Armstrong MRN: [id number] Date of Birth: 03-03-1966 Referring Provider (PT): Ave Filter Date: 01/16/2019  PT End of Session - 01/16/19 0955    Visit Number  23    Number of Visits  26    Date for PT Re-Evaluation  01/28/19    PT Start Time  0909   pt arrived late   PT Stop Time  0948    PT Time Calculation (min)  39 min    Activity Tolerance  Patient tolerated treatment well    Behavior During Therapy  [facility] for tasks assessed/performed       Past Medical History:  Diagnosis Date  . Allergic rhinitis   . Endometrial hyperplasia   . Endometriosis   . Family history of breast cancer   . Family history of breast cancer   . Hx of endometriosis    rectal vag with laser surgery   . Seasonal allergies   . 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 DIAGNOSTIC LAPAROSCOPY    . MASTECTOMY W/ SENTINEL NODE BIOPSY Bilateral 09/16/2018   Procedure: RIGHT MASTECTOMY WITH RIGHT AXILLARY SENTINEL LYMPH NODE BIOPSY AND LEFT RISK REDUCING MASTECTOMY;  Surgeon: Rolm Bookbinder, MD;  Location: [place];  Service: General;  Laterality: Bilateral;    There were no vitals filed for this visit.  Subjective Assessment - 01/16/19 0911    Subjective  The tightness across my chest is doing better but under my arms is still uncomfortable.    Pertinent History  R breast cancer with bilateral mastectomy on 09/16/18 and SLNB (2 nodes both negative), does not need chemo or radiation, endometriosis    Patient Stated Goals  to resume normal functions, drive again, typing, get ROM back    Currently in Pain?  Yes    Pain Score  1     Pain Location  Axilla    Pain  Orientation  Right;Left    Pain Descriptors / Indicators  Tightness;Discomfort    Pain Type  Acute pain    Pain Onset  1 to 4 weeks ago    Pain Frequency  Constant    Aggravating Factors   sitting in poor posture for too longs with arms forward, like on computer    Pain Relieving Factors  stretch arm out    Effect of Pain on Daily Activities  needs to stretch to decrease pain                       [facility] Adult PT Treatment/Exercise - 01/16/19 0001      Manual Therapy   Manual Therapy  Manual Lymphatic Drainage (MLD);Compression Bandaging    Manual Lymphatic Drainage (MLD)  in supine: 5 diagmatic breaths, short neck, left inguinal nodes and establishment of axillo inguinal pathway, L lateral trunk with focus on area of increased swelling at lateral mastectomy scar, moving fluid towards pathways, repeated on opposite side    Compression Bandaging  wrapped 12 cm short stretch bandage across chest to apply compression to bilateral trunk and inferior axilla- pt felt immediate relief with this                  PT  Long Term Goals - 12/31/18 0947      PT LONG TERM GOAL #1   Title  Pt will demonstrate 160 degrees of bilateral shoulder flexion to allow her to reach overhead.    Baseline  R 115, L 105, 10/31/18- R 160 L 156, 820/20- R 166 L 162    Period  Weeks    Status  Achieved      PT LONG TERM GOAL #2   Title  Pt will demonstrate 160 degrees of bilateral shoulder abduction to allow her to reach out to the side.    Baseline  R 91, L 77, 10/31/18- R 175 L 180    Time  4    Period  Weeks    Status  Achieved      PT LONG TERM GOAL #3   Title  Pt will be independent in a home exercise program for continued strengthening and stretching    Baseline  12/31/18- pt is independent and obtained a foam roller for stretches at home    Time  4    Period  Weeks    Status  Achieved      PT LONG TERM GOAL #4   Title  Pt will report a 75% improvement in pain across chest to  allow her to return to prior level of function.    Baseline  10/31/18- 75% improvement    Time  4    Period  Weeks    Status  Achieved      PT LONG TERM GOAL #5   Title  Pt will report a 75% improvement in swelling in bilateral lateral trunk and inferior axilla to allow improved comfort.    Baseline  10/31/18- 60% improvement, 11/28/18- pt has developed swelling over her vacation- so no improvement as of now, 12/31/18- 40% improved    Time  4    Period  Weeks      PT LONG TERM GOAL #6   Title  Pt will experience no pain from sciatica in RLE during the day to allow pt to return to PLOF    Baseline  10/31/18- currently pain is between 5 and 6/10, 11/28/18- 4 or 5/10, 12/31/18- pt is no longer having pain    Time  4    Period  Weeks    Status  Achieved            Plan - 01/16/19 0956    Clinical Impression Statement  Pt's tightness across chest has diminished but she is having increased tightness and discomfort in bilateral axilla. She feels the swelling in this area causes the discomfort. After performing MLD to this area, therapist applied a 12cm short stretch compression bandage around chest to see if this would improve pt comfort.    Rehab Potential  Excellent    PT Frequency  1x / week    PT Duration  4 weeks    PT Next Visit Plan  focus on MLD to bilateral trunk if pt is still having discomfort with this, see how short stretch bandage worked, STM to bilateral pecs, scar massage, with Continue PROM to bilateral shoulders to pt's tolerance    PT Home Exercise Plan  post op breast exercises, supine cane exercises, supine scap, Strength ABC program    Consulted and Agree with Plan of Care  Patient       Patient will benefit from skilled therapeutic intervention in order to improve the following deficits and impairments:  Decreased knowledge of precautions, Decreased  range of motion, Decreased scar mobility, Increased edema, Decreased strength, Increased fascial restricitons, Pain,  Postural dysfunction  Visit Diagnosis: Localized edema     Problem List Patient Active Problem List   Diagnosis Date Noted  . Breast cancer, right ([place]) 09/16/2018  . Genetic testing 09/09/2018  . Malignant neoplasm of upper-inner quadrant of right breast in female, estrogen receptor positive ([place]) 08/19/2018  . Family history of breast cancer   . History of penicillin allergy 09/24/2012  . Exposure to strep throat 09/24/2012  . Perioral dermatitis 09/24/2012  . Left otitis media with effusion 08/16/2011  . Hx of antibiotic allergy 05/03/2011  . Unspecified disorder of the teeth and supporting structures 03/30/2008  . ALLERGIC RHINITIS 02/17/2008  . ENDOMETRIOSIS 02/17/2008  . HEADACHE 02/17/2008  . Hx gestational diabetes 02/17/2008  . POSITIVE PPD 12/17/2007    Allyson Sabal [facility] 01/16/2019, 9:58 AM  [place], [place], [address] Phone: [phone]   Fax:  [phone]  Name: Megan Armstrong MRN: [id number] Date of Birth: Feb 11, 1966  Manus Gunning, PT 01/16/19 9:58 AM

## 2019-01-23 ENCOUNTER — Encounter: Payer: Self-pay | Admitting: Physical Therapy

## 2019-01-23 ENCOUNTER — Ambulatory Visit: Payer: Managed Care, Other (non HMO) | Admitting: Physical Therapy

## 2019-01-23 ENCOUNTER — Other Ambulatory Visit: Payer: Self-pay

## 2019-01-23 DIAGNOSIS — R293 Abnormal posture: Secondary | ICD-10-CM

## 2019-01-23 DIAGNOSIS — Z483 Aftercare following surgery for neoplasm: Secondary | ICD-10-CM

## 2019-01-23 DIAGNOSIS — R600 Localized edema: Secondary | ICD-10-CM

## 2019-01-23 NOTE — Therapy (Signed)
[place], [place], [address] Phone: [phone]   Fax:  [phone]  Physical Therapy Treatment  Patient Details  Name: Megan Armstrong MRN: [id number] Date of Birth: 1966-04-09 Referring Provider (PT): Ave Filter Date: 01/23/2019  PT End of Session - 01/23/19 1611    Visit Number  24    Number of Visits  26    Date for PT Re-Evaluation  01/28/19    PT Start Time  [id number]    PT Stop Time  1448    PT Time Calculation (min)  40 min    Activity Tolerance  Patient tolerated treatment well    Behavior During Therapy  [facility] for tasks assessed/performed       Past Medical History:  Diagnosis Date  . Allergic rhinitis   . Endometrial hyperplasia   . Endometriosis   . Family history of breast cancer   . Family history of breast cancer   . Hx of endometriosis    rectal vag with laser surgery   . Seasonal allergies   . 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 DIAGNOSTIC LAPAROSCOPY    . MASTECTOMY W/ SENTINEL NODE BIOPSY Bilateral 09/16/2018   Procedure: RIGHT MASTECTOMY WITH RIGHT AXILLARY SENTINEL LYMPH NODE BIOPSY AND LEFT RISK REDUCING MASTECTOMY;  Surgeon: Rolm Bookbinder, MD;  Location: [place];  Service: General;  Laterality: Bilateral;    There were no vitals filed for this visit.  Subjective Assessment - 01/23/19 1610    Subjective  Pt states she liked the compression bandage around her chest, but she needs to have help to put it on.    Pertinent History  R breast cancer with bilateral mastectomy on 09/16/18 and SLNB (2 nodes both negative), does not need chemo or radiation, endometriosis    Patient Stated Goals  to resume normal functions, drive again, typing, get ROM back    Currently in Pain?  No/denies                       [facility] Adult PT  Treatment/Exercise - 01/23/19 0001      Self-Care   Self-Care  Other Self-Care Comments    Other Self-Care Comments   gave pt information about knitted knockers . She will be going to the [place] and will look into getting them then       Shoulder Exercises: Supine   Other Supine Exercises  purple ball at thoracic spine and folded pillow at head for pt to do chest opening stretch       Manual Therapy   Manual Therapy  Joint mobilization;Manual Lymphatic Drainage (MLD);Compression Bandaging    Joint Mobilization  gentle oscillations at right A-C joint and coracoid process with stretchint to pec minor     Soft tissue mobilization  gentle, but firmer than MLD stroke at right pec major.     Manual Lymphatic Drainage (MLD)  in supine: 5 diagmatic breaths, short neck, left inguinal nodes and establishment of axillo inguinal pathway, L lateral trunk with focus on area of increased swelling at lateral mastectomy scar, moving fluid towards pathways, repeated on opposite side     Compression Bandaging  pt had good results with short stretch bandage but had trouble applying it.  Gave her chest strap that had been donated to our clnic  and it fit her well and she will be able to apply it herself  Pt pleased with this and will try it at hoe     Passive ROM  diagonal elevation stretch to riight shoudler in supine and assisted with thoracic rotation and proction with right shoulder from left sidelying                   PT Long Term Goals - 12/31/18 0947      PT LONG TERM GOAL #1   Title  Pt will demonstrate 160 degrees of bilateral shoulder flexion to allow her to reach overhead.    Baseline  R 115, L 105, 10/31/18- R 160 L 156, 820/20- R 166 L 162    Period  Weeks    Status  Achieved      PT LONG TERM GOAL #2   Title  Pt will demonstrate 160 degrees of bilateral shoulder abduction to allow her to reach out to the side.    Baseline  R 91, L 77, 10/31/18- R 175 L 180    Time  4     Period  Weeks    Status  Achieved      PT LONG TERM GOAL #3   Title  Pt will be independent in a home exercise program for continued strengthening and stretching    Baseline  12/31/18- pt is independent and obtained a foam roller for stretches at home    Time  4    Period  Weeks    Status  Achieved      PT LONG TERM GOAL #4   Title  Pt will report a 75% improvement in pain across chest to allow her to return to prior level of function.    Baseline  10/31/18- 75% improvement    Time  4    Period  Weeks    Status  Achieved      PT LONG TERM GOAL #5   Title  Pt will report a 75% improvement in swelling in bilateral lateral trunk and inferior axilla to allow improved comfort.    Baseline  10/31/18- 60% improvement, 11/28/18- pt has developed swelling over her vacation- so no improvement as of now, 12/31/18- 40% improved    Time  4    Period  Weeks      PT LONG TERM GOAL #6   Title  Pt will experience no pain from sciatica in RLE during the day to allow pt to return to PLOF    Baseline  10/31/18- currently pain is between 5 and 6/10, 11/28/18- 4 or 5/10, 12/31/18- pt is no longer having pain    Time  4    Period  Weeks    Status  Achieved            Plan - 01/23/19 1612    Clinical Impression Statement  Pt had good skin mobility at incisions, but still has visible fullness above both incisions at pec area, R>L  extra time and firmner presssure used on right pec today with mobilizaion at clavice and coracoid process to stretch into scapular retraction.  Feel that pt could decrease to every other week to continue to monitor how she does with chest strap and self management of fullness at chest and axillae    Personal Factors and Comorbidities  Time since onset of injury/illness/exacerbation    Examination-Activity Limitations  Bathing;Lift;Reach Overhead;Carry    Rehab Potential  Excellent    PT Frequency  1x /  week    PT Duration  4 weeks    PT Treatment/Interventions  ADLs/Self Care  Home Management;Therapeutic activities;Therapeutic exercise;Patient/family education;Manual techniques;Manual lymph drainage;Scar mobilization;Passive range of motion;Taping;Joint Manipulations    PT Next Visit Plan  consider recert for every other week ?? how did chest strap work?  should she try to add chip packs under chest strap for more proximal compression? focus on MLD to bilateral trunk if pt is still having discomfort with this, see how short stretch bandage worked, STM to bilateral pecs, scar massage, with Continue PROM to bilateral shoulders to pt's tolerance    PT Home Exercise Plan  post op breast exercises, supine cane exercises, supine scap, Strength ABC program    Consulted and Agree with Plan of Care  Patient       Patient will benefit from skilled therapeutic intervention in order to improve the following deficits and impairments:  Decreased knowledge of precautions, Decreased range of motion, Decreased scar mobility, Increased edema, Decreased strength, Increased fascial restricitons, Pain, Postural dysfunction  Visit Diagnosis: Localized edema  Aftercare following surgery for neoplasm  Abnormal posture     Problem List Patient Active Problem List   Diagnosis Date Noted  . Breast cancer, right ([place]) 09/16/2018  . Genetic testing 09/09/2018  . Malignant neoplasm of upper-inner quadrant of right breast in female, estrogen receptor positive ([place]) 08/19/2018  . Family history of breast cancer   . History of penicillin allergy 09/24/2012  . Exposure to strep throat 09/24/2012  . Perioral dermatitis 09/24/2012  . Left otitis media with effusion 08/16/2011  . Hx of antibiotic allergy 05/03/2011  . Unspecified disorder of the teeth and supporting structures 03/30/2008  . ALLERGIC RHINITIS 02/17/2008  . ENDOMETRIOSIS 02/17/2008  . HEADACHE 02/17/2008  . Hx gestational diabetes 02/17/2008  . POSITIVE PPD 12/17/2007   Donato Heinz. Owens Shark PT  [place] 01/23/2019, 4:20 PM  [place], [place], [address] Phone: [phone]   Fax:  [phone]  Name: Megan Armstrong MRN: [id number] Date of Birth: 02-25-1966

## 2019-01-24 ENCOUNTER — Encounter: Payer: Self-pay | Admitting: Adult Health

## 2019-01-24 ENCOUNTER — Inpatient Hospital Stay: Payer: Managed Care, Other (non HMO) | Attending: Hematology and Oncology | Admitting: Adult Health

## 2019-01-24 DIAGNOSIS — Z170 Estrogen receptor positive status [ER+]: Secondary | ICD-10-CM | POA: Diagnosis not present

## 2019-01-24 DIAGNOSIS — C50211 Malignant neoplasm of upper-inner quadrant of right female breast: Secondary | ICD-10-CM | POA: Diagnosis not present

## 2019-01-24 NOTE — Progress Notes (Signed)
SURVIVORSHIP VIRTUAL VISIT:  I connected with Megan Armstrong on 01/24/19 at  1:00 PM EDT by telephone and verified that I am speaking with the correct person using two identifiers.  I discussed the limitations, risks, security and privacy concerns of performing an evaluation and management service by telephone and the availability of in person appointments. I also discussed with the patient that there may be a patient responsible charge related to this service. The patient expressed understanding and agreed to proceed.   BRIEF ONCOLOGIC HISTORY:  Oncology History  Malignant neoplasm of upper-inner quadrant of right breast in female, estrogen receptor positive ([place])  08/13/2018 Initial Diagnosis   Palpable mass at 1230 to 1 o'clock position right breast with skin indentation and focal dimpling UOQ, ultrasound to masses.  12:30 position: 2 cm mass, at 1 o'clock position: 0.9 cm mass, together 3.4 cm.  UOQ: 2 additional masses 0.7 cm and 0.7 cm, indeterminate retroareolar calcifications.   08/13/2018 Pathology Results   Right breast needle core biopsy 1230: Grade 2 ILC with LCIS, UOQ 10:30 position: Grade 2 ILC with LCIS, ER 90%, PR 30%, Ki-67 10%, HER-2 -1+ by IHC.  T1c N0 stage Ia clinical stage   09/06/2018 Genetic Testing   Negative genetic testing on the CustomNext-Cancer+RNAinsight testing. 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  The CustomNext-Expanded gene panel offered by [facility] and includes sequencing and rearrangement analysis for the following 81 genes: AIP, ALK, APC*, ATM*, AXIN2, BAP1, BARD1, BLM, BMPR1A, BRCA1*, BRCA2*, BRIP1*, CDC73, CDH1*, CDK4, CDKN1B, CDKN2A, CHEK2*, CTNNA1, DICER1, FANCC, FH, FLCN, GALNT12, HOXB13, KIT, MAX, MEN1, MET, MLH1*, MRE11A, MSH2*, MSH6*, MUTYH*, NBN, NF1*, NF2, NTHL1, PALB2*, PDGFRA, PHOX2B, PMS2*, POLD1, POLE, POT1, PRKAR1A, PTCH1, PTEN*, RAD50, RAD51C*, RAD51D*, RB1, RET, SDHA, SDHAF2, SDHB, SDHC, SDHD, SMAD4, SMARCA4, SMARCB1, SMARCE1, STK11,  SUFU, TMEM127, TP53*, TSC1, TSC2, VHL and XRCC2 (sequencing and deletion/duplication); CASR, CFTR, CPA1, CTRC, EGFR, MITF, PRSS1 and SPINK1 (sequencing only); EPCAM and GREM1 (deletion/duplication only). DNA and RNA analyses performed for * genes.  The report date is Sep 06, 2018.   09/16/2018 Surgery   Bilateral mastectomies: Left mastectomy benign, right mastectomy: Invasive lobular cancer, multifocal, largest tumor 3.2 cm, grade 2, margins negative, 1/2 lymph nodes with isolated tumor cells, ER 90%, PR 70%, HER-2 -1+, Ki-67 10% T2 N0 I+ stage Ia   09/16/2018 Oncotype testing   17/5%   09/20/2018 Cancer Staging   Staging form: Breast, AJCC 8th Edition - Pathologic stage from 09/20/2018: Stage IA (pT2, pN0(i+)(sn), cM0, G2, ER+, PR+, HER2-) - Signed by Nicholas Lose, MD on 09/20/2018   09/2018 -  Anti-estrogen oral therapy   Tamoxifen daily     INTERVAL HISTORY:  Megan Armstrong to review her survivorship care plan detailing her treatment course for breast cancer, as well as monitoring long-term side effects of that treatment, education regarding health maintenance, screening, and overall wellness and health promotion.     Overall, Megan Armstrong reports feeling quite well.  She is mildly fatigued.  She is exercising regularly.  She is taking tamoxifen daily.  She has mild hot flashes that she is managing and tolerating well.  She sees PT regularly.  She is nervous about Tamoxifen considering her h/o uterine hyperplasia.    REVIEW OF SYSTEMS:  Review of Systems  Constitutional: Positive for fatigue. Negative for appetite change, chills and fever.  HENT:   Negative for hearing loss, lump/mass, sore throat and trouble swallowing.   Eyes: Negative for eye problems and icterus.  Respiratory: Negative for  chest tightness, cough and shortness of breath.   Cardiovascular: Negative for chest pain, leg swelling and palpitations.  Gastrointestinal: Negative for abdominal distention, abdominal pain,  constipation, diarrhea, nausea and vomiting.  Endocrine: Positive for hot flashes.  Genitourinary: Negative for difficulty urinating.   Musculoskeletal: Negative for arthralgias.  Skin: Negative for itching and rash.  Neurological: Negative for dizziness, extremity weakness and numbness.  Hematological: Negative for adenopathy. Does not bruise/bleed easily.  Psychiatric/Behavioral: Negative for depression. The patient is not nervous/anxious.   Breast: Denies any new nodularity, masses, tenderness, nipple changes, or nipple discharge.      ONCOLOGY TREATMENT TEAM:  1. Surgeon:  Dr. Donne Hazel at [facility] 2. Medical Oncologist: Dr. Lindi Adie  3. Plastic Surgeon: Dr. Iran Planas    PAST MEDICAL/SURGICAL HISTORY:  Past Medical History:  Diagnosis Date  . Allergic rhinitis   . Endometrial hyperplasia   . Endometriosis   . Family history of breast cancer   . Family history of breast cancer   . Hx of endometriosis    rectal vag with laser surgery   . Seasonal allergies   . 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 DIAGNOSTIC LAPAROSCOPY    . MASTECTOMY W/ SENTINEL NODE BIOPSY Bilateral 09/16/2018   Procedure: RIGHT MASTECTOMY WITH RIGHT AXILLARY SENTINEL LYMPH NODE BIOPSY AND LEFT RISK REDUCING MASTECTOMY;  Surgeon: Rolm Bookbinder, MD;  Location: [place];  Service: General;  Laterality: Bilateral;     ALLERGIES:  Allergies  Allergen Reactions  . Codeine   . Penicillins     REACTION: rash and swelling to amoxicillin  . Clindamycin/Lincomycin Rash  . Keflex [Cephalexin] Rash     CURRENT MEDICATIONS:  Outpatient Encounter Medications as of 01/24/2019  Medication Sig  . methocarbamol (ROBAXIN) 750 MG tablet Take 1 tablet (750 mg total) by mouth every 8 (eight) hours as needed (use for muscle cramps/pain). (Patient not taking: Reported on  11/12/2018)  . Multiple Vitamins-Minerals (CENTRUM ULTRA WOMENS PO) Take by mouth.  . oxyCODONE (OXY IR/ROXICODONE) 5 MG immediate release tablet Take 1 tablet (5 mg total) by mouth every 6 (six) hours as needed for moderate pain, severe pain or breakthrough pain. (Patient not taking: Reported on 10/10/2018)  . tamoxifen (NOLVADEX) 20 MG tablet TAKE 1 TABLET(20 MG) BY MOUTH DAILY  . vitamin C (ASCORBIC ACID) 250 MG tablet Take 250 mg by mouth daily.   No facility-administered encounter medications on file as of 01/24/2019.      ONCOLOGIC FAMILY HISTORY:  Family History  Problem Relation Age of Onset  . Migraines Sister   . Asthma Other   . Diabetes Other   . Hypertension Other   . Sudden death Other   . Breast cancer Cousin 22       mat first cousin, BRCA pos     GENETIC COUNSELING/TESTING: negative  SOCIAL HISTORY:  Social History   Socioeconomic History  . Marital status: Married    Spouse name: Not on file  . Number of children: Not on file  . Years of education: Not on file  . Highest education level: Not on file  Occupational History  . Not on file  Social Needs  . Financial resource strain: Not on file  . Food insecurity    Worry: Not on file    Inability: Not on file  . Transportation needs    Medical: Not  on file    Non-medical: Not on file  Tobacco Use  . Smoking status: Never Smoker  . Smokeless tobacco: Never Used  Substance and Sexual Activity  . Alcohol use: No  . Drug use: No  . Sexual activity: Not on file  Lifestyle  . Physical activity    Days per week: Not on file    Minutes per session: Not on file  . Stress: Not on file  Relationships  . Social Herbalist on phone: Not on file    Gets together: Not on file    Attends religious service: Not on file    Active member of club or organization: Not on file    Attends meetings of clubs or organizations: Not on file    Relationship status: Not on file  . Intimate partner violence     Fear of current or ex partner: Not on file    Emotionally abused: Not on file    Physically abused: Not on file    Forced sexual activity: Not on file  Other Topics Concern  . Not on file  Social History Narrative   Borderline spirometry   Married   Regular exercise   decff tea, some coke   2 children at home hh of 4    Pharmacy school internship   Family form [place]     OBSERVATIONS/OBJECTIVE:   Patient sounds well.  She is in no apparent distress. Mood and behavior are normal.  Speech is normal..  LABORATORY DATA:  None for this visit.  DIAGNOSTIC IMAGING:  None for this visit.      ASSESSMENT AND PLAN:  Megan Armstrong is a pleasant 53 y.o. 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  She presents to the Survivorship Clinic for our initial meeting and routine follow-up post-completion of treatment for breast cancer.   1. Stage IA right breast cancer:  Megan Armstrong is continuing to recover from definitive treatment for breast cancer. She will follow-up with her medical oncologist, Dr. Lindi Adie in 6 months with history and physical exam per surveillance protocol.  She will continue her anti-estrogen therapy with Tamoxifen. Thus far, she is tolerating the Tamoxifen well, with minimal side effects. She was instructed to make Dr. Lindi Adie or myself aware if she begins to experience any worsening side effects of the medication and I could see her back in clinic to help manage those side effects, as needed. Today, a comprehensive survivorship care plan and treatment summary was reviewed with the patient today detailing her breast cancer diagnosis, treatment course, potential late/long-term effects of treatment, appropriate follow-up care with recommendations for the future, and patient education resources.  A copy of this summary, along with  a letter will be sent to the patient's primary care provider via mail/fax/In Basket message after today's visit.    2. Cancer screening:  Due to Megan Armstrong's history and her age, she should receive screening for skin cancers, colon cancer, and gynecologic cancers.  The information and recommendations are listed on the patient's comprehensive care plan/treatment summary and were reviewed in detail with the patient.    3. Health maintenance and wellness promotion: Megan Armstrong was encouraged to consume 5-7 servings of fruits and vegetables per day. We reviewed the "Nutrition Rainbow" handout, as well as the handout "Take Control of Your Health and Reduce Your Cancer Risk" from the American Cancer  Society.  She was also encouraged to engage in moderate to vigorous exercise for 30 minutes per day most days of the week. We discussed the [employer] fitness program, which is designed for cancer survivors to help them become more physically fit after cancer treatments.  She was instructed to limit her alcohol consumption and continue to abstain from tobacco use.     4. Support services/counseling: It is not uncommon for this period of the patient's cancer care trajectory to be one of many emotions and stressors.  We discussed how this can be increasingly difficult during the times of quarantine and social distancing due to the COVID-19 pandemic.   She was given information regarding our available services and encouraged to contact me with any questions or for help enrolling in any of our support group/programs.   5.  Uterine Hyperplasia:  I let Kamethri know that we will send a letter to Dr. Ronita Hipps about doing an ultrasound to evaluate the thickness of the lining of the uterus by transvaginal ultrasound annually.     Follow up instructions:    -Return to cancer center in 03/2019 for f/u with Dr. Lindi Adie  -F/u with Dr. Donne Hazel in 09/2019 -She is welcome to return back to the Survivorship Clinic at any time;  no additional follow-up needed at this time.  -Consider referral back to survivorship as a long-term survivor for continued surveillance  The patient was provided an opportunity to ask questions and all were answered. The patient agreed with the plan and demonstrated an understanding of the instructions.   The patient was advised to call back or seek an in-person evaluation if the symptoms worsen or if the condition fails to improve as anticipated.   I provided 30 minutes of non face-to-face telephone visit time during this encounter, and > 50% was spent counseling as documented under my assessment & plan.  Scot Dock, NP

## 2019-01-27 ENCOUNTER — Telehealth: Payer: Self-pay | Admitting: Adult Health

## 2019-01-27 NOTE — Telephone Encounter (Signed)
Called patient to inform her of her scheduled appt dates and time.  Spoke with pt and she is aware of her appt date and time.

## 2019-01-28 ENCOUNTER — Other Ambulatory Visit: Payer: Self-pay

## 2019-01-28 ENCOUNTER — Ambulatory Visit: Payer: Managed Care, Other (non HMO) | Admitting: Physical Therapy

## 2019-01-28 ENCOUNTER — Encounter: Payer: Self-pay | Admitting: Physical Therapy

## 2019-01-28 DIAGNOSIS — M25611 Stiffness of right shoulder, not elsewhere classified: Secondary | ICD-10-CM

## 2019-01-28 DIAGNOSIS — M6281 Muscle weakness (generalized): Secondary | ICD-10-CM

## 2019-01-28 DIAGNOSIS — R293 Abnormal posture: Secondary | ICD-10-CM

## 2019-01-28 DIAGNOSIS — R600 Localized edema: Secondary | ICD-10-CM

## 2019-01-28 DIAGNOSIS — M25512 Pain in left shoulder: Secondary | ICD-10-CM

## 2019-01-28 DIAGNOSIS — M25511 Pain in right shoulder: Secondary | ICD-10-CM

## 2019-01-28 DIAGNOSIS — Z483 Aftercare following surgery for neoplasm: Secondary | ICD-10-CM

## 2019-01-28 DIAGNOSIS — M25612 Stiffness of left shoulder, not elsewhere classified: Secondary | ICD-10-CM

## 2019-01-28 DIAGNOSIS — M79604 Pain in right leg: Secondary | ICD-10-CM

## 2019-01-28 NOTE — Therapy (Signed)
[place], [place], [address] Phone: [phone]   Fax:  [phone]  Physical Therapy Treatment  Patient Details  Name: Megan Armstrong MRN: [id number] Date of Birth: February 17, 1966 Referring Provider (PT): Ave Filter Date: 01/28/2019  PT End of Session - 01/28/19 1454    Visit Number  25    Number of Visits  29    Date for PT Re-Evaluation  02/25/19    PT Start Time  [id number]   pt arrived late   PT Stop Time  1453    PT Time Calculation (min)  46 min    Activity Tolerance  Patient tolerated treatment well    Behavior During Therapy  [facility] for tasks assessed/performed       Past Medical History:  Diagnosis Date  . Allergic rhinitis   . Endometrial hyperplasia   . Endometriosis   . Family history of breast cancer   . Family history of breast cancer   . Hx of endometriosis    rectal vag with laser surgery   . Seasonal allergies   . 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 DIAGNOSTIC LAPAROSCOPY    . MASTECTOMY W/ SENTINEL NODE BIOPSY Bilateral 09/16/2018   Procedure: RIGHT MASTECTOMY WITH RIGHT AXILLARY SENTINEL LYMPH NODE BIOPSY AND LEFT RISK REDUCING MASTECTOMY;  Surgeon: Rolm Bookbinder, MD;  Location: [facility];  Service: General;  Laterality: Bilateral;    There were no vitals filed for this visit.  Subjective Assessment - 01/28/19 1408    Subjective  The swelling is still there so I think I need to continue therapy. It really has not gotten any better.    Pertinent History  R breast cancer with bilateral mastectomy on 09/16/18 and SLNB (2 nodes both negative), does not need chemo or radiation, endometriosis    Patient Stated Goals  to resume normal functions, drive again, typing, get ROM back    Currently in Pain?  Yes    Pain Score  3     Pain Location  Axilla     Pain Orientation  Right;Left    Pain Descriptors / Indicators  Shooting;Radiating    Pain Type  Acute pain                       [facility] Adult PT Treatment/Exercise - 01/28/19 0001      Manual Therapy   Soft tissue mobilization  to left serratus anterior in area of tightness    Manual Lymphatic Drainage (MLD)  in supine: short neck, superficial and deep abdominals, left inguinal nodes and establishment of axillo inguinal pathway, L lateral trunk with focus on area of increased swelling at lateral mastectomy scar, moving fluid towards pathways, repeated on opposite side                   PT Long Term Goals - 01/28/19 1411      PT LONG TERM GOAL #1   Title  Pt will demonstrate 160 degrees of bilateral shoulder flexion to allow her to reach overhead.    Baseline  R 115, L 105, 10/31/18- R 160 L 156, 820/20- R 166 L 162    Time  4    Period  Weeks    Status  Achieved      PT LONG TERM GOAL #2  Title  Pt will demonstrate 160 degrees of bilateral shoulder abduction to allow her to reach out to the side.    Baseline  R 91, L 77, 10/31/18- R 175 L 180    Time  4    Period  Weeks    Status  Achieved      PT LONG TERM GOAL #3   Title  Pt will be independent in a home exercise program for continued strengthening and stretching    Baseline  12/31/18- pt is independent and obtained a foam roller for stretches at home    Time  4    Period  Weeks    Status  Achieved      PT LONG TERM GOAL #4   Title  Pt will report a 75% improvement in pain across chest to allow her to return to prior level of function.    Baseline  10/31/18- 75% improvement    Time  4    Period  Weeks    Status  Achieved      PT LONG TERM GOAL #5   Title  Pt will report a 75% improvement in swelling in bilateral lateral trunk and inferior axilla to allow improved comfort.    Baseline  10/31/18- 60% improvement, 11/28/18- pt has developed swelling over her vacation- so no improvement as of now,  12/31/18- 40% improved, 01/28/19- 40%    Time  4    Period  Weeks    Status  On-going      PT LONG TERM GOAL #6   Title  Pt will experience no pain from sciatica in RLE during the day to allow pt to return to PLOF    Baseline  10/31/18- currently pain is between 5 and 6/10, 11/28/18- 4 or 5/10, 12/31/18- pt is no longer having pain    Time  4    Period  Weeks    Status  Achieved            Plan - 01/28/19 1455    Clinical Impression Statement  Continued to focus on decreasing bilateral lateral trunk swelling since pt is still having discomfort in this area. She reports that the band given at last session to help with this slides down and that the compression bandaging holds up better but is difficult to apply. Pt would benefit from continued skilled PT services to continue to work on decreasing discomfort from lateral trunk swelling and serratus tightness. Will continue 1x/wk for another 4 weeks.    PT Frequency  1x / week    PT Duration  4 weeks    PT Treatment/Interventions  ADLs/Self Care Home Management;Therapeutic activities;Therapeutic exercise;Patient/family education;Manual techniques;Manual lymph drainage;Scar mobilization;Passive range of motion;Taping;Joint Manipulations    PT Next Visit Plan  see if pt brought chest strap,   should she try to add chip packs under chest strap for more proximal compression? focus on MLD to bilateral trunk if pt is still having discomfort with this, see how short stretch bandage worked, STM to bilateral pecs, scar massage, with Continue PROM to bilateral shoulders to pt's tolerance    PT Home Exercise Plan  post op breast exercises, supine cane exercises, supine scap, Strength ABC program    Consulted and Agree with Plan of Care  Patient       Patient will benefit from skilled therapeutic intervention in order to improve the following deficits and impairments:  Decreased knowledge of precautions, Decreased range of motion, Decreased scar mobility,  Increased edema, Decreased  strength, Increased fascial restricitons, Pain, Postural dysfunction  Visit Diagnosis: Localized edema  Aftercare following surgery for neoplasm  Abnormal posture  Pain in right leg  Stiffness of right shoulder, not elsewhere classified  Acute pain of right shoulder  Stiffness of left shoulder, not elsewhere classified  Acute pain of left shoulder  Muscle weakness (generalized)     Problem List Patient Active Problem List   Diagnosis Date Noted  . Breast cancer, right ([place]) 09/16/2018  . Genetic testing 09/09/2018  . Malignant neoplasm of upper-inner quadrant of right breast in female, estrogen receptor positive ([place]) 08/19/2018  . Family history of breast cancer   . History of penicillin allergy 09/24/2012  . Exposure to strep throat 09/24/2012  . Perioral dermatitis 09/24/2012  . Left otitis media with effusion 08/16/2011  . Hx of antibiotic allergy 05/03/2011  . Unspecified disorder of the teeth and supporting structures 03/30/2008  . ALLERGIC RHINITIS 02/17/2008  . ENDOMETRIOSIS 02/17/2008  . HEADACHE 02/17/2008  . Hx gestational diabetes 02/17/2008  . POSITIVE PPD 12/17/2007    Allyson Sabal [facility] 01/28/2019, 2:58 PM  [place], [place], [address] Phone: [phone]   Fax:  [phone]  Name: Megan Armstrong MRN: [id number] Date of Birth: 04-20-1965  Manus Gunning, PT 01/28/19 2:58 PM

## 2019-02-04 ENCOUNTER — Encounter: Payer: Self-pay | Admitting: Rehabilitation

## 2019-02-04 ENCOUNTER — Ambulatory Visit: Payer: Managed Care, Other (non HMO) | Admitting: Rehabilitation

## 2019-02-04 ENCOUNTER — Other Ambulatory Visit: Payer: Self-pay

## 2019-02-04 DIAGNOSIS — R600 Localized edema: Secondary | ICD-10-CM

## 2019-02-04 DIAGNOSIS — M25512 Pain in left shoulder: Secondary | ICD-10-CM

## 2019-02-04 DIAGNOSIS — R293 Abnormal posture: Secondary | ICD-10-CM

## 2019-02-04 DIAGNOSIS — M25612 Stiffness of left shoulder, not elsewhere classified: Secondary | ICD-10-CM

## 2019-02-04 DIAGNOSIS — Z483 Aftercare following surgery for neoplasm: Secondary | ICD-10-CM

## 2019-02-05 NOTE — Therapy (Signed)
[place], [place], [address] Phone: [phone]   Fax:  [phone]  Physical Therapy Treatment  Patient Details  Name: Megan Armstrong MRN: [id number] Date of Birth: [birth year] Referring Provider (PT): Ave Filter Date: 02/04/2019  PT End of Session - 02/04/19 1553    Visit Number  26    Number of Visits  29    Date for PT Re-Evaluation  02/25/19    PT Start Time  1402    PT Stop Time  1500    PT Time Calculation (min)  58 min    Activity Tolerance  Patient tolerated treatment well    Behavior During Therapy  [facility] for tasks assessed/performed       Past Medical History:  Diagnosis Date  . Allergic rhinitis   . Endometrial hyperplasia   . Endometriosis   . Family history of breast cancer   . Family history of breast cancer   . Hx of endometriosis    rectal vag with laser surgery   . Seasonal allergies   . 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 DIAGNOSTIC LAPAROSCOPY    . MASTECTOMY W/ SENTINEL NODE BIOPSY Bilateral 09/16/2018   Procedure: RIGHT MASTECTOMY WITH RIGHT AXILLARY SENTINEL LYMPH NODE BIOPSY AND LEFT RISK REDUCING MASTECTOMY;  Surgeon: Rolm Bookbinder, MD;  Location: [place];  Service: General;  Laterality: Bilateral;    There were no vitals filed for this visit.  Subjective Assessment - 02/04/19 1405    Subjective  No changes.  I use the band at night and the bra during the day.  It builds up tightness during the day and then I can stretch it out.    Pertinent History  R breast cancer with bilateral mastectomy on 09/16/18 and SLNB (2 nodes both negative), does not need chemo or radiation, endometriosis    Patient Stated Goals  to resume normal functions, drive again, typing, get ROM back    Currently in Pain?  Yes    Pain Score  2     Pain  Location  Axilla    Pain Orientation  Left;Right    Pain Descriptors / Indicators  Tightness    Pain Type  Surgical pain    Pain Onset  More than a month ago    Pain Frequency  Intermittent                       [facility] Adult PT Treatment/Exercise - 02/05/19 0001      Shoulder Exercises: Stretch   Other Shoulder Stretches  showed pt latissimus counter stretch and doorway stretch with instruction on medbridge      Manual Therapy   Soft tissue mobilization  to left serratus and latissimus in area of tightness with overhead [place] flexion held    Manual Lymphatic Drainage (MLD)  in supine: short neck, superficial and deep abdominals, left inguinal nodes and establishment of axillo inguinal pathway, L lateral trunk with focus on area of increased swelling at lateral mastectomy scar, moving fluid towards pathways, repeated on opposite side                   PT Long Term Goals - 01/28/19 1411      PT LONG TERM GOAL #1   Title  Pt will demonstrate 160 degrees of  bilateral shoulder flexion to allow her to reach overhead.    Baseline  R 115, L 105, 10/31/18- R 160 L 156, 820/20- R 166 L 162    Time  4    Period  Weeks    Status  Achieved      PT LONG TERM GOAL #2   Title  Pt will demonstrate 160 degrees of bilateral shoulder abduction to allow her to reach out to the side.    Baseline  R 91, L 77, 10/31/18- R 175 L 180    Time  4    Period  Weeks    Status  Achieved      PT LONG TERM GOAL #3   Title  Pt will be independent in a home exercise program for continued strengthening and stretching    Baseline  12/31/18- pt is independent and obtained a foam roller for stretches at home    Time  4    Period  Weeks    Status  Achieved      PT LONG TERM GOAL #4   Title  Pt will report a 75% improvement in pain across chest to allow her to return to prior level of function.    Baseline  10/31/18- 75% improvement    Time  4    Period  Weeks    Status  Achieved      PT  LONG TERM GOAL #5   Title  Pt will report a 75% improvement in swelling in bilateral lateral trunk and inferior axilla to allow improved comfort.    Baseline  10/31/18- 60% improvement, 11/28/18- pt has developed swelling over her vacation- so no improvement as of now, 12/31/18- 40% improved, 01/28/19- 40%    Time  4    Period  Weeks    Status  On-going      PT LONG TERM GOAL #6   Title  Pt will experience no pain from sciatica in RLE during the day to allow pt to return to PLOF    Baseline  10/31/18- currently pain is between 5 and 6/10, 11/28/18- 4 or 5/10, 12/31/18- pt is no longer having pain    Time  4    Period  Weeks    Status  Achieved            Plan - 02/04/19 1553    Clinical Impression Statement  continued bilateral MLD for lateral trunk swelling.  Pt reports tightness and pain concordant to palpation at puckering near latissimus border slightly onto more anterior chest.    PT Frequency  1x / week    PT Duration  4 weeks    PT Treatment/Interventions  ADLs/Self Care Home Management;Therapeutic activities;Therapeutic exercise;Patient/family education;Manual techniques;Manual lymph drainage;Scar mobilization;Passive range of motion;Taping;Joint Manipulations    PT Next Visit Plan  focus on MLD to bilateral trunk if pt is still having discomfort with this, see how short stretch bandage worked, STM to bilateral pecs, scar massage, with Continue PROM to bilateral shoulders to pt's tolerance    PT Home Exercise Plan  post op breast exercises, supine cane exercises, supine scap, Strength ABC program    Consulted and Agree with Plan of Care  Patient       Patient will benefit from skilled therapeutic intervention in order to improve the following deficits and impairments:     Visit Diagnosis: Aftercare following surgery for neoplasm  Abnormal posture  Localized edema  Stiffness of left shoulder, not elsewhere classified  Acute pain of  left shoulder     Problem  List Patient Active Problem List   Diagnosis Date Noted  . Breast cancer, right ([place]) 09/16/2018  . Genetic testing 09/09/2018  . Malignant neoplasm of upper-inner quadrant of right breast in female, estrogen receptor positive ([place]) 08/19/2018  . Family history of breast cancer   . History of penicillin allergy 09/24/2012  . Exposure to strep throat 09/24/2012  . Perioral dermatitis 09/24/2012  . Left otitis media with effusion 08/16/2011  . Hx of antibiotic allergy 05/03/2011  . Unspecified disorder of the teeth and supporting structures 03/30/2008  . ALLERGIC RHINITIS 02/17/2008  . ENDOMETRIOSIS 02/17/2008  . HEADACHE 02/17/2008  . Hx gestational diabetes 02/17/2008  . POSITIVE PPD 12/17/2007    Stark Bray 02/05/2019, 3:55 PM  [place], [place], [address] Phone: [phone]   Fax:  [phone]  Name: Megan Armstrong MRN: [id number] Date of Birth: Jun 05, 1965

## 2019-02-11 ENCOUNTER — Encounter: Payer: Self-pay | Admitting: Physical Therapy

## 2019-02-11 ENCOUNTER — Ambulatory Visit: Payer: Managed Care, Other (non HMO) | Attending: General Surgery | Admitting: Physical Therapy

## 2019-02-11 ENCOUNTER — Other Ambulatory Visit: Payer: Self-pay

## 2019-02-11 DIAGNOSIS — M6281 Muscle weakness (generalized): Secondary | ICD-10-CM | POA: Insufficient documentation

## 2019-02-11 DIAGNOSIS — M79604 Pain in right leg: Secondary | ICD-10-CM | POA: Diagnosis present

## 2019-02-11 DIAGNOSIS — M25512 Pain in left shoulder: Secondary | ICD-10-CM | POA: Diagnosis present

## 2019-02-11 DIAGNOSIS — R293 Abnormal posture: Secondary | ICD-10-CM | POA: Diagnosis present

## 2019-02-11 DIAGNOSIS — M25511 Pain in right shoulder: Secondary | ICD-10-CM | POA: Insufficient documentation

## 2019-02-11 DIAGNOSIS — R600 Localized edema: Secondary | ICD-10-CM

## 2019-02-11 DIAGNOSIS — M25611 Stiffness of right shoulder, not elsewhere classified: Secondary | ICD-10-CM | POA: Diagnosis present

## 2019-02-11 DIAGNOSIS — Z483 Aftercare following surgery for neoplasm: Secondary | ICD-10-CM | POA: Insufficient documentation

## 2019-02-11 DIAGNOSIS — M25612 Stiffness of left shoulder, not elsewhere classified: Secondary | ICD-10-CM | POA: Diagnosis present

## 2019-02-11 NOTE — Therapy (Signed)
[place], [place], [address] Phone: [phone]   Fax:  [phone]  Physical Therapy Treatment  Patient Details  Name: Megan Armstrong MRN: [id number] Date of Birth: 06-15-1965 Referring Provider (PT): Ave Filter Date: 02/11/2019  PT End of Session - 02/11/19 1452    Visit Number  27    Number of Visits  29    Date for PT Re-Evaluation  02/25/19    PT Start Time  [id number]    PT Stop Time  1451    PT Time Calculation (min)  49 min    Activity Tolerance  Patient tolerated treatment well    Behavior During Therapy  [facility] for tasks assessed/performed       Past Medical History:  Diagnosis Date  . Allergic rhinitis   . Endometrial hyperplasia   . Endometriosis   . Family history of breast cancer   . Family history of breast cancer   . Hx of endometriosis    rectal vag with laser surgery   . Seasonal allergies   . 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 DIAGNOSTIC LAPAROSCOPY    . MASTECTOMY W/ SENTINEL NODE BIOPSY Bilateral 09/16/2018   Procedure: RIGHT MASTECTOMY WITH RIGHT AXILLARY SENTINEL LYMPH NODE BIOPSY AND LEFT RISK REDUCING MASTECTOMY;  Surgeon: Rolm Bookbinder, MD;  Location: [place];  Service: General;  Laterality: Bilateral;    There were no vitals filed for this visit.  Subjective Assessment - 02/11/19 1403    Subjective  I think the swelling has dimished a little bit.    Pertinent History  R breast cancer with bilateral mastectomy on 09/16/18 and SLNB (2 nodes both negative), does not need chemo or radiation, endometriosis    Patient Stated Goals  to resume normal functions, drive again, typing, get ROM back    Currently in Pain?  No/denies    Pain Score  0-No pain                       [facility] Adult PT Treatment/Exercise - 02/11/19  0001      Manual Therapy   Soft tissue mobilization  to left and right serratus and latissimus in area of tightness with overhead [place] flexion held    Manual Lymphatic Drainage (MLD)  in supine: short neck, 5 diaphragmatic breaths, left inguinal nodes and establishment of axillo inguinal pathway, L lateral trunk with focus on area of increased swelling at lateral mastectomy scar, moving fluid towards pathways, repeated on opposite side                   PT Long Term Goals - 01/28/19 1411      PT LONG TERM GOAL #1   Title  Pt will demonstrate 160 degrees of bilateral shoulder flexion to allow her to reach overhead.    Baseline  R 115, L 105, 10/31/18- R 160 L 156, 820/20- R 166 L 162    Time  4    Period  Weeks    Status  Achieved      PT LONG TERM GOAL #2   Title  Pt will demonstrate 160 degrees of bilateral shoulder abduction to allow her to reach out to the side.    Baseline  R 91, L 77, 10/31/18- R 175 L 180  Time  4    Period  Weeks    Status  Achieved      PT LONG TERM GOAL #3   Title  Pt will be independent in a home exercise program for continued strengthening and stretching    Baseline  12/31/18- pt is independent and obtained a foam roller for stretches at home    Time  4    Period  Weeks    Status  Achieved      PT LONG TERM GOAL #4   Title  Pt will report a 75% improvement in pain across chest to allow her to return to prior level of function.    Baseline  10/31/18- 75% improvement    Time  4    Period  Weeks    Status  Achieved      PT LONG TERM GOAL #5   Title  Pt will report a 75% improvement in swelling in bilateral lateral trunk and inferior axilla to allow improved comfort.    Baseline  10/31/18- 60% improvement, 11/28/18- pt has developed swelling over her vacation- so no improvement as of now, 12/31/18- 40% improved, 01/28/19- 40%    Time  4    Period  Weeks    Status  On-going      PT LONG TERM GOAL #6   Title  Pt will experience no pain from  sciatica in RLE during the day to allow pt to return to PLOF    Baseline  10/31/18- currently pain is between 5 and 6/10, 11/28/18- 4 or 5/10, 12/31/18- pt is no longer having pain    Time  4    Period  Weeks    Status  Achieved            Plan - 02/11/19 1452    Clinical Impression Statement  Continued working on MLD to bilateral trunk. Overall, pt's swelling is improving. She is having tightness and discomfort in area of serratus bilaterally so performed soft tissue mobilization to this area to help decrease discomfort.    PT Frequency  1x / week    PT Duration  4 weeks    PT Treatment/Interventions  ADLs/Self Care Home Management;Therapeutic activities;Therapeutic exercise;Patient/family education;Manual techniques;Manual lymph drainage;Scar mobilization;Passive range of motion;Taping;Joint Manipulations    PT Next Visit Plan  focus on MLD to bilateral trunk if pt is still having discomfort with this,  STM to bilateral pecs, scar massage, with Continue PROM to bilateral shoulders to pt's tolerance    PT Home Exercise Plan  post op breast exercises, supine cane exercises, supine scap, Strength ABC program    Consulted and Agree with Plan of Care  Patient       Patient will benefit from skilled therapeutic intervention in order to improve the following deficits and impairments:  Decreased knowledge of precautions, Decreased range of motion, Decreased scar mobility, Increased edema, Decreased strength, Increased fascial restricitons, Pain, Postural dysfunction  Visit Diagnosis: Aftercare following surgery for neoplasm  Localized edema     Problem List Patient Active Problem List   Diagnosis Date Noted  . Breast cancer, right ([place]) 09/16/2018  . Genetic testing 09/09/2018  . Malignant neoplasm of upper-inner quadrant of right breast in female, estrogen receptor positive ([place]) 08/19/2018  . Family history of breast cancer   . History of penicillin allergy 09/24/2012  . Exposure  to strep throat 09/24/2012  . Perioral dermatitis 09/24/2012  . Left otitis media with effusion 08/16/2011  . Hx of antibiotic allergy 05/03/2011  .  Unspecified disorder of the teeth and supporting structures 03/30/2008  . ALLERGIC RHINITIS 02/17/2008  . ENDOMETRIOSIS 02/17/2008  . HEADACHE 02/17/2008  . Hx gestational diabetes 02/17/2008  . POSITIVE PPD 12/17/2007    Allyson Sabal [facility] 02/11/2019, 2:54 PM  [place], [place], [address] Phone: [phone]   Fax:  [phone]  Name: Megan Armstrong MRN: [id number] Date of Birth: [birth year]  Manus Gunning, PT 02/11/19 2:54 PM

## 2019-02-18 ENCOUNTER — Other Ambulatory Visit: Payer: Self-pay

## 2019-02-18 ENCOUNTER — Encounter: Payer: Self-pay | Admitting: Physical Therapy

## 2019-02-18 ENCOUNTER — Ambulatory Visit: Payer: Managed Care, Other (non HMO) | Admitting: Physical Therapy

## 2019-02-18 DIAGNOSIS — Z483 Aftercare following surgery for neoplasm: Secondary | ICD-10-CM | POA: Diagnosis not present

## 2019-02-18 DIAGNOSIS — R600 Localized edema: Secondary | ICD-10-CM

## 2019-02-18 NOTE — Therapy (Signed)
[place], [place], [address] Phone: [phone]   Fax:  [phone]  Physical Therapy Treatment  Patient Details  Name: Megan Armstrong MRN: [id number] Date of Birth: [birth year] Referring Provider (PT): Ave Filter Date: 02/18/2019  PT End of Session - 02/18/19 1449    Visit Number  28    Number of Visits  29    Date for PT Re-Evaluation  02/25/19    PT Start Time  [id number]    PT Stop Time  1450    PT Time Calculation (min)  45 min    Activity Tolerance  Patient tolerated treatment well    Behavior During Therapy  [facility] for tasks assessed/performed       Past Medical History:  Diagnosis Date  . Allergic rhinitis   . Endometrial hyperplasia   . Endometriosis   . Family history of breast cancer   . Family history of breast cancer   . Hx of endometriosis    rectal vag with laser surgery   . Seasonal allergies   . 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 DIAGNOSTIC LAPAROSCOPY    . MASTECTOMY W/ SENTINEL NODE BIOPSY Bilateral 09/16/2018   Procedure: RIGHT MASTECTOMY WITH RIGHT AXILLARY SENTINEL LYMPH NODE BIOPSY AND LEFT RISK REDUCING MASTECTOMY;  Surgeon: Rolm Bookbinder, MD;  Location: [place];  Service: General;  Laterality: Bilateral;    There were no vitals filed for this visit.  Subjective Assessment - 02/18/19 1407    Subjective  I think the swelling is pretty much the same if not dimished a little. I feel some siffness today under the armpits.    Pertinent History  R breast cancer with bilateral mastectomy on 09/16/18 and SLNB (2 nodes both negative), does not need chemo or radiation, endometriosis    Patient Stated Goals  to resume normal functions, drive again, typing, get ROM back    Currently in Pain?  No/denies    Pain Score  0-No pain                        [facility] Adult PT Treatment/Exercise - 02/18/19 0001      Manual Therapy   Myofascial Release  to bilateral axilla with prolonged holds into flexion    Manual Lymphatic Drainage (MLD)  in supine: short neck, 5 diaphragmatic breaths, left inguinal nodes and establishment of axillo inguinal pathway, L lateral trunk with focus on area of increased swelling at lateral mastectomy scar, moving fluid towards pathways, repeated on opposite side     Compression Bandaging  donned 12 cm compression bandage around chest to help decrease lateral trunk edema                  PT Long Term Goals - 01/28/19 1411      PT LONG TERM GOAL #1   Title  Pt will demonstrate 160 degrees of bilateral shoulder flexion to allow her to reach overhead.    Baseline  R 115, L 105, 10/31/18- R 160 L 156, 820/20- R 166 L 162    Time  4    Period  Weeks    Status  Achieved      PT LONG TERM GOAL #2   Title  Pt will demonstrate 160 degrees of bilateral shoulder abduction to allow her  to reach out to the side.    Baseline  R 91, L 77, 10/31/18- R 175 L 180    Time  4    Period  Weeks    Status  Achieved      PT LONG TERM GOAL #3   Title  Pt will be independent in a home exercise program for continued strengthening and stretching    Baseline  12/31/18- pt is independent and obtained a foam roller for stretches at home    Time  4    Period  Weeks    Status  Achieved      PT LONG TERM GOAL #4   Title  Pt will report a 75% improvement in pain across chest to allow her to return to prior level of function.    Baseline  10/31/18- 75% improvement    Time  4    Period  Weeks    Status  Achieved      PT LONG TERM GOAL #5   Title  Pt will report a 75% improvement in swelling in bilateral lateral trunk and inferior axilla to allow improved comfort.    Baseline  10/31/18- 60% improvement, 11/28/18- pt has developed swelling over her vacation- so no improvement as of now, 12/31/18- 40%  improved, 01/28/19- 40%    Time  4    Period  Weeks    Status  On-going      PT LONG TERM GOAL #6   Title  Pt will experience no pain from sciatica in RLE during the day to allow pt to return to PLOF    Baseline  10/31/18- currently pain is between 5 and 6/10, 11/28/18- 4 or 5/10, 12/31/18- pt is no longer having pain    Time  4    Period  Weeks    Status  Achieved            Plan - 02/18/19 1451    Clinical Impression Statement  Pt having more tightness in bilateral axilla with end range of motion. Focused today on myofascial release to bilateral axilla to help decrease tightness then performed MLD to bilateral axilla and trunk in area of swelling. Donned pt's compression bandage around chest to help with swelling.    PT Frequency  1x / week    PT Duration  4 weeks    PT Treatment/Interventions  ADLs/Self Care Home Management;Therapeutic activities;Therapeutic exercise;Patient/family education;Manual techniques;Manual lymph drainage;Scar mobilization;Passive range of motion;Taping;Joint Manipulations    PT Next Visit Plan  focus on MLD to bilateral trunk if pt is still having discomfort with this,  STM to bilateral pecs, scar massage, with Continue PROM to bilateral shoulders to pt's tolerance    PT Home Exercise Plan  post op breast exercises, supine cane exercises, supine scap, Strength ABC program    Consulted and Agree with Plan of Care  Patient       Patient will benefit from skilled therapeutic intervention in order to improve the following deficits and impairments:  Decreased knowledge of precautions, Decreased range of motion, Decreased scar mobility, Increased edema, Decreased strength, Increased fascial restricitons, Pain, Postural dysfunction  Visit Diagnosis: Aftercare following surgery for neoplasm  Localized edema     Problem List Patient Active Problem List   Diagnosis Date Noted  . Breast cancer, right ([place]) 09/16/2018  . Genetic testing 09/09/2018  .  Malignant neoplasm of upper-inner quadrant of right breast in female, estrogen receptor positive ([place]) 08/19/2018  . Family history of breast cancer   .  History of penicillin allergy 09/24/2012  . Exposure to strep throat 09/24/2012  . Perioral dermatitis 09/24/2012  . Left otitis media with effusion 08/16/2011  . Hx of antibiotic allergy 05/03/2011  . Unspecified disorder of the teeth and supporting structures 03/30/2008  . ALLERGIC RHINITIS 02/17/2008  . ENDOMETRIOSIS 02/17/2008  . HEADACHE 02/17/2008  . Hx gestational diabetes 02/17/2008  . POSITIVE PPD 12/17/2007    Allyson Sabal [facility] 02/18/2019, 2:53 PM  [place], [place], [address] Phone: [phone]   Fax:  [phone]  Name: Kinnidi Gratton MRN: [id number] Date of Birth: 05-03-1965  Manus Gunning, PT 02/18/19 2:53 PM

## 2019-02-25 ENCOUNTER — Encounter: Payer: Self-pay | Admitting: Physical Therapy

## 2019-02-25 ENCOUNTER — Other Ambulatory Visit: Payer: Self-pay

## 2019-02-25 ENCOUNTER — Ambulatory Visit: Payer: Managed Care, Other (non HMO) | Admitting: Physical Therapy

## 2019-02-25 DIAGNOSIS — M6281 Muscle weakness (generalized): Secondary | ICD-10-CM

## 2019-02-25 DIAGNOSIS — M25612 Stiffness of left shoulder, not elsewhere classified: Secondary | ICD-10-CM

## 2019-02-25 DIAGNOSIS — M25611 Stiffness of right shoulder, not elsewhere classified: Secondary | ICD-10-CM

## 2019-02-25 DIAGNOSIS — Z483 Aftercare following surgery for neoplasm: Secondary | ICD-10-CM

## 2019-02-25 DIAGNOSIS — R600 Localized edema: Secondary | ICD-10-CM

## 2019-02-25 DIAGNOSIS — M79604 Pain in right leg: Secondary | ICD-10-CM

## 2019-02-25 DIAGNOSIS — R293 Abnormal posture: Secondary | ICD-10-CM

## 2019-02-25 DIAGNOSIS — M25511 Pain in right shoulder: Secondary | ICD-10-CM

## 2019-02-25 DIAGNOSIS — M25512 Pain in left shoulder: Secondary | ICD-10-CM

## 2019-02-25 NOTE — Therapy (Signed)
[place], [place], [address] Phone: [phone]   Fax:  [phone]  Physical Therapy Treatment  Patient Details  Name: Megan Armstrong MRN: [id number] Date of Birth: 1965-08-23 Referring Provider (PT): Ave Filter Date: 02/25/2019  PT End of Session - 02/25/19 1453    Visit Number  29    Number of Visits  33    Date for PT Re-Evaluation  04/01/19    PT Start Time  1302    PT Stop Time  1350    PT Time Calculation (min)  48 min    Activity Tolerance  Patient tolerated treatment well    Behavior During Therapy  [facility] for tasks assessed/performed       Past Medical History:  Diagnosis Date  . Allergic rhinitis   . Endometrial hyperplasia   . Endometriosis   . Family history of breast cancer   . Family history of breast cancer   . Hx of endometriosis    rectal vag with laser surgery   . Seasonal allergies   . 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 DIAGNOSTIC LAPAROSCOPY    . MASTECTOMY W/ SENTINEL NODE BIOPSY Bilateral 09/16/2018   Procedure: RIGHT MASTECTOMY WITH RIGHT AXILLARY SENTINEL LYMPH NODE BIOPSY AND LEFT RISK REDUCING MASTECTOMY;  Surgeon: Rolm Bookbinder, MD;  Location: [facility];  Service: General;  Laterality: Bilateral;    There were no vitals filed for this visit.  Subjective Assessment - 02/25/19 1404    Subjective  I think I am feeling good but I am still having discomfort under the armpits. It still fluctuates.    Pertinent History  R breast cancer with bilateral mastectomy on 09/16/18 and SLNB (2 nodes both negative), does not need chemo or radiation, endometriosis    Patient Stated Goals  to resume normal functions, drive again, typing, get ROM back    Currently in Pain?  No/denies    Pain Score  0-No pain                        [facility] Adult PT Treatment/Exercise - 02/25/19 0001      Manual Therapy   Edema Management  created new chip packs for pt to wear in bra since others had flattened out    Soft tissue mobilization  to bilateral trunk and inferior axilla along serratus anterior to area of tightness and discomfort                  PT Long Term Goals - 02/25/19 1405      PT LONG TERM GOAL #1   Title  Pt will demonstrate 160 degrees of bilateral shoulder flexion to allow her to reach overhead.    Baseline  R 115, L 105, 10/31/18- R 160 L 156, 820/20- R 166 L 162    Time  4    Period  Weeks    Status  Achieved      PT LONG TERM GOAL #2   Title  Pt will demonstrate 160 degrees of bilateral shoulder abduction to allow her to reach out to the side.    Baseline  R 91, L 77, 10/31/18- R 175 L 180    Time  4    Period  Weeks    Status  Achieved  PT LONG TERM GOAL #3   Title  Pt will be independent in a home exercise program for continued strengthening and stretching    Baseline  12/31/18- pt is independent and obtained a foam roller for stretches at home    Time  4    Period  Weeks    Status  Achieved      PT LONG TERM GOAL #4   Title  Pt will report a 75% improvement in pain across chest to allow her to return to prior level of function.    Baseline  10/31/18- 75% improvement    Time  4    Period  Weeks    Status  Achieved      PT LONG TERM GOAL #5   Title  Pt will report a 75% improvement in swelling in bilateral lateral trunk and inferior axilla to allow improved comfort.    Baseline  10/31/18- 60% improvement, 11/28/18- pt has developed swelling over her vacation- so no improvement as of now, 12/31/18- 40% improved, 01/28/19- 40%, 02/25/19- 50% improved    Time  4    Status  On-going      PT LONG TERM GOAL #6   Title  Pt will experience no pain from sciatica in RLE during the day to allow pt to return to PLOF    Baseline  10/31/18- currently pain is  between 5 and 6/10, 11/28/18- 4 or 5/10, 12/31/18- pt is no longer having pain    Time  4    Period  Weeks    Status  Achieved            Plan - 02/25/19 1454    Clinical Impression Statement  Pt is continuing to have tightness in bilateral axilla and lateral trunk. She is progressing towards her goal with this but still feels discomfort in this area. She does feel that therapy is helping improve it slowly. She has been compliant with wearing compression because it provides her comfort. Created new chip packs for pt today since the others had flattened. Pt would benefit from additional skilled PT visits to continue to decrease tightness and discomfort in bilateral trunk as well as manage swelling in these areas as needed.    Rehab Potential  Excellent    PT Frequency  1x / week    PT Duration  --   5 weeks   PT Treatment/Interventions  ADLs/Self Care Home Management;Therapeutic activities;Therapeutic exercise;Patient/family education;Manual techniques;Manual lymph drainage;Scar mobilization;Passive range of motion;Taping;Joint Manipulations    PT Next Visit Plan  focus on MLD to bilateral trunk if pt is still having discomfort with this,  STM to bilateral pecs, scar massage, with Continue PROM to bilateral shoulders to pt's tolerance    PT Home Exercise Plan  post op breast exercises, supine cane exercises, supine scap, Strength ABC program    Consulted and Agree with Plan of Care  Patient       Patient will benefit from skilled therapeutic intervention in order to improve the following deficits and impairments:  Decreased knowledge of precautions, Decreased range of motion, Decreased scar mobility, Increased edema, Decreased strength, Increased fascial restricitons, Pain, Postural dysfunction  Visit Diagnosis: Aftercare following surgery for neoplasm  Localized edema  Abnormal posture  Stiffness of left shoulder, not elsewhere classified  Acute pain of left shoulder  Pain in  right leg  Stiffness of right shoulder, not elsewhere classified  Acute pain of right shoulder  Muscle weakness (generalized)     Problem List  Patient Active Problem List   Diagnosis Date Noted  . Breast cancer, right ([place]) 09/16/2018  . Genetic testing 09/09/2018  . Malignant neoplasm of upper-inner quadrant of right breast in female, estrogen receptor positive ([place]) 08/19/2018  . Family history of breast cancer   . History of penicillin allergy 09/24/2012  . Exposure to strep throat 09/24/2012  . Perioral dermatitis 09/24/2012  . Left otitis media with effusion 08/16/2011  . Hx of antibiotic allergy 05/03/2011  . Unspecified disorder of the teeth and supporting structures 03/30/2008  . ALLERGIC RHINITIS 02/17/2008  . ENDOMETRIOSIS 02/17/2008  . HEADACHE 02/17/2008  . Hx gestational diabetes 02/17/2008  . POSITIVE PPD 12/17/2007    Allyson Sabal [facility] 02/25/2019, 2:57 PM  [place], [place], [address] Phone: [phone]   Fax:  [phone]  Name: Nekoda Lehane MRN: [id number] Date of Birth: 11/28/1965  Manus Gunning, PT 02/25/19 2:57 PM

## 2019-03-11 ENCOUNTER — Encounter: Payer: Self-pay | Admitting: Physical Therapy

## 2019-03-11 ENCOUNTER — Ambulatory Visit: Payer: Managed Care, Other (non HMO) | Attending: General Surgery | Admitting: Physical Therapy

## 2019-03-11 ENCOUNTER — Other Ambulatory Visit: Payer: Self-pay

## 2019-03-11 ENCOUNTER — Telehealth: Payer: Self-pay | Admitting: *Deleted

## 2019-03-11 DIAGNOSIS — R600 Localized edema: Secondary | ICD-10-CM | POA: Diagnosis present

## 2019-03-11 DIAGNOSIS — Z483 Aftercare following surgery for neoplasm: Secondary | ICD-10-CM

## 2019-03-11 NOTE — Telephone Encounter (Signed)
Copied from [place] [phone]. Topic: Appointment Scheduling - Scheduling Inquiry for Clinic >> Mar 11, 2019  5:10 PM Percell Belt A wrote: Reason for CRM: pt called in at 5:10, she stated that she was returning a call to someone that left her a vm to move up her cpe  Best number  [phone] Pt stated she would be able to take the 3 pm cpe on 12/2 that was left on her vm.

## 2019-03-11 NOTE — Therapy (Signed)
[place], [place], [address] Phone: [phone]   Fax:  [phone]  Physical Therapy Treatment  Patient Details  Name: Megan Armstrong MRN: [id number] Date of Birth: July 28, 1965 Referring Provider (PT): Ave Filter Date: 03/11/2019  PT End of Session - 03/11/19 1448    Visit Number  30    Number of Visits  33    Date for PT Re-Evaluation  04/01/19    PT Start Time  1402    PT Stop Time  1450    PT Time Calculation (min)  48 min    Activity Tolerance  Patient tolerated treatment well    Behavior During Therapy  [facility] for tasks assessed/performed       Past Medical History:  Diagnosis Date  . Allergic rhinitis   . Endometrial hyperplasia   . Endometriosis   . Family history of breast cancer   . Family history of breast cancer   . Hx of endometriosis    rectal vag with laser surgery   . Seasonal allergies   . 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 DIAGNOSTIC LAPAROSCOPY    . MASTECTOMY W/ SENTINEL NODE BIOPSY Bilateral 09/16/2018   Procedure: RIGHT MASTECTOMY WITH RIGHT AXILLARY SENTINEL LYMPH NODE BIOPSY AND LEFT RISK REDUCING MASTECTOMY;  Surgeon: Rolm Bookbinder, MD;  Location: [facility];  Service: General;  Laterality: Bilateral;    There were no vitals filed for this visit.  Subjective Assessment - 03/11/19 1403    Subjective  The discomfort went completely away for a couple of days but then it came back.    Pertinent History  R breast cancer with bilateral mastectomy on 09/16/18 and SLNB (2 nodes both negative), does not need chemo or radiation, endometriosis    Patient Stated Goals  to resume normal functions, drive again, typing, get ROM back    Currently in Pain?  No/denies    Pain Score  0-No pain                       [facility] Adult PT  Treatment/Exercise - 03/11/19 0001      Manual Therapy   Soft tissue mobilization  using biotone to bilateral trunk and inferior axilla along serratus anterior and lattisimus in area of tightness and discomfort    Manual Lymphatic Drainage (MLD)  in supine: short neck, 5 diaphragmatic breaths, left inguinal nodes and establishment of axillo inguinal pathway, L lateral trunk moving fluid towards pathways, repeated on opposite side                   PT Long Term Goals - 02/25/19 1405      PT LONG TERM GOAL #1   Title  Pt will demonstrate 160 degrees of bilateral shoulder flexion to allow her to reach overhead.    Baseline  R 115, L 105, 10/31/18- R 160 L 156, 820/20- R 166 L 162    Time  4    Period  Weeks    Status  Achieved      PT LONG TERM GOAL #2   Title  Pt will demonstrate 160 degrees of bilateral shoulder abduction to allow her to reach out to the side.    Baseline  R 91, L 77, 10/31/18- R 175 L 180    Time  4    Period  Weeks    Status  Achieved      PT LONG TERM GOAL #3   Title  Pt will be independent in a home exercise program for continued strengthening and stretching    Baseline  12/31/18- pt is independent and obtained a foam roller for stretches at home    Time  4    Period  Weeks    Status  Achieved      PT LONG TERM GOAL #4   Title  Pt will report a 75% improvement in pain across chest to allow her to return to prior level of function.    Baseline  10/31/18- 75% improvement    Time  4    Period  Weeks    Status  Achieved      PT LONG TERM GOAL #5   Title  Pt will report a 75% improvement in swelling in bilateral lateral trunk and inferior axilla to allow improved comfort.    Baseline  10/31/18- 60% improvement, 11/28/18- pt has developed swelling over her vacation- so no improvement as of now, 12/31/18- 40% improved, 01/28/19- 40%, 02/25/19- 50% improved    Time  4    Status  On-going      PT LONG TERM GOAL #6   Title  Pt will experience no pain from  sciatica in RLE during the day to allow pt to return to PLOF    Baseline  10/31/18- currently pain is between 5 and 6/10, 11/28/18- 4 or 5/10, 12/31/18- pt is no longer having pain    Time  4    Period  Weeks    Status  Achieved            Plan - 03/11/19 1451    Clinical Impression Statement  Pt presenting with tightness in bilateral serratus and lattissiums. Performed soft tissue mobilization to these areas with pt reporting a release. Also, pt has some visible swelling in lateral trunk extending slightly posterior so ended session with MLD to these areas bilaterally.    PT Frequency  1x / week    PT Duration  --   5 weeks   PT Treatment/Interventions  ADLs/Self Care Home Management;Therapeutic activities;Therapeutic exercise;Patient/family education;Manual techniques;Manual lymph drainage;Scar mobilization;Passive range of motion;Taping;Joint Manipulations    PT Next Visit Plan  focus on MLD to bilateral trunk if pt is still having discomfort with this,  STM to bilateral pecs, scar massage, with Continue PROM to bilateral shoulders to pt's tolerance    PT Home Exercise Plan  post op breast exercises, supine cane exercises, supine scap, Strength ABC program    Consulted and Agree with Plan of Care  Patient       Patient will benefit from skilled therapeutic intervention in order to improve the following deficits and impairments:  Decreased knowledge of precautions, Decreased range of motion, Decreased scar mobility, Increased edema, Decreased strength, Increased fascial restricitons, Pain, Postural dysfunction  Visit Diagnosis: Aftercare following surgery for neoplasm  Localized edema     Problem List Patient Active Problem List   Diagnosis Date Noted  . Breast cancer, right ([place]) 09/16/2018  . Genetic testing 09/09/2018  . Malignant neoplasm of upper-inner quadrant of right breast in female, estrogen receptor positive ([place]) 08/19/2018  . Family history of breast cancer   .  History of penicillin allergy 09/24/2012  . Exposure to strep throat 09/24/2012  . Perioral dermatitis 09/24/2012  . Left otitis media with effusion 08/16/2011  . Hx  of antibiotic allergy 05/03/2011  . Unspecified disorder of the teeth and supporting structures 03/30/2008  . ALLERGIC RHINITIS 02/17/2008  . ENDOMETRIOSIS 02/17/2008  . HEADACHE 02/17/2008  . Hx gestational diabetes 02/17/2008  . POSITIVE PPD 12/17/2007    Allyson Sabal [facility] 03/11/2019, 2:57 PM  [place], [place], [address] Phone: [phone]   Fax:  [phone]  Name: Megan Armstrong MRN: [id number] Date of Birth: 1965/12/24  Manus Gunning, PT 03/11/19 2:57 PM

## 2019-03-12 ENCOUNTER — Ambulatory Visit (INDEPENDENT_AMBULATORY_CARE_PROVIDER_SITE_OTHER): Payer: Managed Care, Other (non HMO) | Admitting: Internal Medicine

## 2019-03-12 ENCOUNTER — Encounter: Payer: Self-pay | Admitting: Internal Medicine

## 2019-03-12 VITALS — BP 108/62 | HR 92 | Temp 98.0°F | Ht 62.0 in | Wt 119.2 lb

## 2019-03-12 DIAGNOSIS — Z0000 Encounter for general adult medical examination without abnormal findings: Secondary | ICD-10-CM | POA: Diagnosis not present

## 2019-03-12 DIAGNOSIS — Z008 Encounter for other general examination: Secondary | ICD-10-CM | POA: Diagnosis not present

## 2019-03-12 DIAGNOSIS — Z8632 Personal history of gestational diabetes: Secondary | ICD-10-CM | POA: Diagnosis not present

## 2019-03-12 DIAGNOSIS — C50211 Malignant neoplasm of upper-inner quadrant of right female breast: Secondary | ICD-10-CM

## 2019-03-12 DIAGNOSIS — Z170 Estrogen receptor positive status [ER+]: Secondary | ICD-10-CM

## 2019-03-12 NOTE — Telephone Encounter (Signed)
Pt has been called and rescheduled.

## 2019-03-12 NOTE — Patient Instructions (Signed)
Glad you are doing well .  Continue lifestyle intervention healthy eating and exercise .  Weight bearing  Get appt for fasting lab.     Health Maintenance, Female Adopting a healthy lifestyle and getting preventive care are important in promoting health and wellness. Ask your health care provider about:  The right schedule for you to have regular tests and exams.  Things you can do on your own to prevent diseases and keep yourself healthy. What should I know about diet, weight, and exercise? Eat a healthy diet   Eat a diet that includes plenty of vegetables, fruits, low-fat dairy products, and lean protein.  Do not eat a lot of foods that are high in solid fats, added sugars, or sodium. Maintain a healthy weight Body mass index (BMI) is used to identify weight problems. It estimates body fat based on height and weight. Your health care provider can help determine your BMI and help you achieve or maintain a healthy weight. Get regular exercise Get regular exercise. This is one of the most important things you can do for your health. Most adults should:  Exercise for at least 150 minutes each week. The exercise should increase your heart rate and make you sweat (moderate-intensity exercise).  Do strengthening exercises at least twice a week. This is in addition to the moderate-intensity exercise.  Spend less time sitting. Even light physical activity can be beneficial. Watch cholesterol and blood lipids Have your blood tested for lipids and cholesterol at 53 years of age, then have this test every 5 years. Have your cholesterol levels checked more often if:  Your lipid or cholesterol levels are high.  You are older than 53 years of age.  You are at high risk for heart disease. What should I know about cancer screening? Depending on your health history and family history, you may need to have cancer screening at various ages. This may include screening for:  Breast cancer.   Cervical cancer.  Colorectal cancer.  Skin cancer.  Lung cancer. What should I know about heart disease, diabetes, and high blood pressure? Blood pressure and heart disease  High blood pressure causes heart disease and increases the risk of stroke. This is more likely to develop in people who have high blood pressure readings, are of African descent, or are overweight.  Have your blood pressure checked: ? Every 3-5 years if you are 51-55 years of age. ? Every year if you are 66 years old or older. Diabetes Have regular diabetes screenings. This checks your fasting blood sugar level. Have the screening done:  Once every three years after age 50 if you are at a normal weight and have a low risk for diabetes.  More often and at a younger age if you are overweight or have a high risk for diabetes. What should I know about preventing infection? Hepatitis B If you have a higher risk for hepatitis B, you should be screened for this virus. Talk with your health care provider to find out if you are at risk for hepatitis B infection. Hepatitis C Testing is recommended for:  Everyone born from [birth year] through 1965.  Anyone with known risk factors for hepatitis C. Sexually transmitted infections (STIs)  Get screened for STIs, including gonorrhea and chlamydia, if: ? You are sexually active and are younger than 53 years of age. ? You are older than 53 years of age and your health care provider tells you that you are at risk for this type of  infection. ? Your sexual activity has changed since you were last screened, and you are at increased risk for chlamydia or gonorrhea. Ask your health care provider if you are at risk.  Ask your health care provider about whether you are at high risk for HIV. Your health care provider may recommend a prescription medicine to help prevent HIV infection. If you choose to take medicine to prevent HIV, you should first get tested for HIV. You should then be tested  every 3 months for as long as you are taking the medicine. Pregnancy  If you are about to stop having your period (premenopausal) and you may become pregnant, seek counseling before you get pregnant.  Take 400 to 800 micrograms (mcg) of folic acid every day if you become pregnant.  Ask for birth control (contraception) if you want to prevent pregnancy. Osteoporosis and menopause Osteoporosis is a disease in which the bones lose minerals and strength with aging. This can result in bone fractures. If you are 56 years old or older, or if you are at risk for osteoporosis and fractures, ask your health care provider if you should:  Be screened for bone loss.  Take a calcium or vitamin D supplement to lower your risk of fractures.  Be given hormone replacement therapy (HRT) to treat symptoms of menopause. Follow these instructions at home: Lifestyle  Do not use any products that contain nicotine or tobacco, such as cigarettes, e-cigarettes, and chewing tobacco. If you need help quitting, ask your health care provider.  Do not use street drugs.  Do not share needles.  Ask your health care provider for help if you need support or information about quitting drugs. Alcohol use  Do not drink alcohol if: ? Your health care provider tells you not to drink. ? You are pregnant, may be pregnant, or are planning to become pregnant.  If you drink alcohol: ? Limit how much you use to 0-1 drink a day. ? Limit intake if you are breastfeeding.  Be aware of how much alcohol is in your drink. In the [place] one drink equals one 12 oz bottle of beer (355 mL), one 5 oz glass of wine (148 mL), or one 1 oz glass of hard liquor (44 mL). General instructions  Schedule regular health, dental, and eye exams.  Stay current with your vaccines.  Tell your health care provider if: ? You often feel depressed. ? You have ever been abused or do not feel safe at home. Summary  Adopting a healthy lifestyle and  getting preventive care are important in promoting health and wellness.  Follow your health care provider's instructions about healthy diet, exercising, and getting tested or screened for diseases.  Follow your health care provider's instructions on monitoring your cholesterol and blood pressure. This information is not intended to replace advice given to you by your health care provider. Make sure you discuss any questions you have with your health care provider. Document Released: 10/10/2010 Document Revised: 03/20/2018 Document Reviewed: 03/20/2018 Elsevier Patient Education  2020 [employer].

## 2019-03-12 NOTE — Progress Notes (Addendum)
This visit occurred during the SARS-CoV-2 public health emergency.  Safety protocols were in place, including screening questions prior to the visit, additional usage of staff PPE, and extensive cleaning of exam room while observing appropriate contact time as indicated for disinfecting solutions.    Chief Complaint  Patient presents with   Annual Exam    Pt wants a neoplasm on left thigh looked at that has been there since she was a teenager     HPI: Patient  Megan Armstrong  53 y.o. comes in today for Preventive Health Care visit   r mastectomy with axillar ln bcx and left mastectomy  dooing ok on provera for now  Pt   Doing better  Still has periods    Not as much  headahce   Dr Collene Mares has done colonoscopy  Asks about getting earlier than 10 years   OB gyne   Dr Ronita Hipps.     Hx of  endometrial hyperplasia.  In 2016    rx with  Progesterone.  And not on tamoxifen.    Concern.  Has ? Check spot lef thigh a has had since younger may be darker no change size Health Maintenance  Topic Date Due   HIV Screening  10/30/1980   COLONOSCOPY  10/31/2015   PAP SMEAR-Modifier  03/12/2019 (Originally 03/10/2016)   MAMMOGRAM  01/19/2020   TETANUS/TDAP  02/05/2024   INFLUENZA VACCINE  Completed   Health Maintenance Review LIFESTYLE:  Exercise:   Yes PT  Walking  Good  Tobacco/ETS:N    Alcohol:  n Sugar beverages:   Tea  Sleep:  Since surgery more  7-8  Drug use: no HH of   4  Work:   From home   .   80 %  Back     ROS:  GEN/ HEENT: No fever, significant weight changes sweats headaches vision problems hearing changes, CV/ PULM; No chest pain shortness of breath cough, syncope,edema  change in exercise tolerance. GI /GU: No adominal pain, vomiting, change in bowel habits. No blood in the stool. No significant GU symptoms. SKIN/HEME: ,no acute skin rashes suspicious lesions or bleeding. No lymphadenopathy, nodules, masses.  NEURO/ PSYCH:  No neurologic signs such as weakness  numbness. No depression anxiety. IMM/ Allergy: No unusual infections.  Allergy .   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    Past Surgical History:  Procedure Laterality Date   DIAGNOSTIC LAPAROSCOPY     MASTECTOMY W/ SENTINEL NODE BIOPSY Bilateral 09/16/2018   Procedure: RIGHT MASTECTOMY WITH RIGHT AXILLARY SENTINEL LYMPH NODE BIOPSY AND LEFT RISK REDUCING MASTECTOMY;  Surgeon: Rolm Bookbinder, MD;  Location: [facility];  Service: General;  Laterality: Bilateral;    Family History  Problem Relation Age of Onset   Migraines Sister    Asthma Other    Diabetes Other    Hypertension Other    Sudden death Other    Breast cancer Cousin 15       mat first cousin, BRCA pos    Social History  Socioeconomic History   Marital status: Married    Spouse name: Not on file   Number of children: Not on file   Years of education: Not on file   Highest education level: Not on file  Occupational History   Not on file  Social Needs   Financial resource strain: Not on file   Food insecurity    Worry: Not on file    Inability: Not on file   Transportation needs    Medical: Not on file    Non-medical: Not on file  Tobacco Use   Smoking status: Never Smoker   Smokeless tobacco: Never Used  Substance and Sexual Activity   Alcohol use: No   Drug use: No   Sexual activity: Not on file  Lifestyle   Physical activity    Days per week: Not on file    Minutes per session: Not on file   Stress: Not on file  Relationships   Social connections    Talks on phone: Not on file    Gets  together: Not on file    Attends religious service: Not on file    Active member of club or organization: Not on file    Attends meetings of clubs or organizations: Not on file    Relationship status: Not on file  Other Topics Concern   Not on file  Social History Narrative   Borderline spirometry   Married   Regular exercise   decff tea, some coke   2 children at home hh of 4    Pharmacy school internship   Family form [place]    Outpatient Medications Prior to Visit  Medication Sig Dispense Refill   Multiple Vitamins-Minerals (CENTRUM ULTRA WOMENS PO) Take by mouth.     tamoxifen (NOLVADEX) 20 MG tablet TAKE 1 TABLET(20 MG) BY MOUTH DAILY 90 tablet 0   vitamin C (ASCORBIC ACID) 250 MG tablet Take 250 mg by mouth daily.     methocarbamol (ROBAXIN) 750 MG tablet Take 1 tablet (750 mg total) by mouth every 8 (eight) hours as needed (use for muscle cramps/pain). (Patient not taking: Reported on 03/12/2019) 20 tablet 1   oxyCODONE (OXY IR/ROXICODONE) 5 MG immediate release tablet Take 1 tablet (5 mg total) by mouth every 6 (six) hours as needed for moderate pain, severe pain or breakthrough pain. (Patient not taking: Reported on 03/12/2019) 12 tablet 0   No facility-administered medications prior to visit.      EXAM:  BP 108/62 (BP Location: Right Arm, Patient Position: Sitting, Cuff Size: Normal)    Pulse 92    Temp 98 F (36.7 C) (Temporal)    Ht 5' 2"  (1.575 m)    Wt 119 lb 3.2 oz (54.1 kg)    SpO2 98%    BMI 21.80 kg/m   Body mass index is 21.8 kg/m. Wt Readings from Last 3 Encounters:  03/12/19 119 lb 3.2 oz (54.1 kg)  09/16/18 115 lb 1.3 oz (52.2 kg)  08/19/18 116 lb 4.8 oz (52.8 kg)    Physical Exam: Vital signs reviewed TYO:MAYO is a well-developed well-nourished alert cooperative    who appearsr stated age in no acute distress.  HEENT: normocephalic atraumatic , Eyes: PERRL EOM's full, conjunctiva clear, , Ears: no deformity EAC's clear TMs with normal  landmarks. Mouth: masked  NECK: supple without masses, thyromegaly or bruits. CHEST/PULM:  Clear to auscultation and percussion breath sounds equal no wheeze , rales or rhonchi. chest wall   Scars  Right some stretching and on bump feeling like keloid or scar tissue  No other deformities or tenderness. Breast: . CV: PMI is nondisplaced, S1 S2 no gallops, murmurs, rubs. Peripheral pulses are full without delay.No JVD .  ABDOMEN: Bowel sounds normal nontender  No guard or rebound, no hepato splenomegal no CVA tenderness.  No hernia. Extremtities:  No clubbing cyanosis or edema, no acute joint swelling or redness no focal atrophy NEURO:  Oriented x3, cranial nerves 3-12 appear to be intact, no obvious focal weakness,gait within normal limits no abnormal reflexes or asymmetrical SKIN: No acute rashes normal turgor, color, no bruising or petechiae. 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 LN: no cervical axillary inguinal adenopathy  Lab Results  Component Value Date   WBC 7.0 04/08/2018   HGB 13.2 04/08/2018   HCT 39.7 04/08/2018   PLT 261.0 04/08/2018   GLUCOSE 86 04/08/2018   CHOL 219 (H) 04/08/2018   TRIG 93.0 04/08/2018   HDL 46.90 04/08/2018   LDLCALC 153 (H) 04/08/2018   ALT 11 04/08/2018   AST 12 04/08/2018   NA 135 04/08/2018   K 4.1 04/08/2018   CL 103 04/08/2018   CREATININE 0.81 04/08/2018   BUN 16 04/08/2018   CO2 26 04/08/2018   TSH 1.33 04/08/2018   HGBA1C 5.4 04/08/2018    BP Readings from Last 3 Encounters:  03/12/19 108/62  09/17/18 107/66  08/19/18 128/68    Lab plan reviewed with patient  Needs  Screening form done  Will come back for fasting labs   ASSESSMENT AND PLAN:  Discussed the following assessment and plan:    ICD-10-CM   1. Visit for preventive health examination  S96.28 Basic metabolic panel     CBC with Differential    Hemoglobin A1c    Hepatic function panel    Lipid panel    TSH  2. Malignant neoplasm of upper-inner quadrant of right breast in female, estrogen receptor positive ([facility])  Z66.294 Basic metabolic panel   T65.4 CBC with Differential    Hemoglobin A1c    Hepatic function panel    Lipid panel    TSH  3. Hx gestational diabetes  Y50.35 Basic metabolic panel    CBC with Differential    Hemoglobin A1c    Hepatic function panel    Lipid panel    TSH  4. Encounter for biometric screening  W65.6 Basic metabolic panel    CBC with Differential    Hemoglobin A1c    Hepatic function panel    Lipid panel    TSH   Reviewed ?s  As best possible  And hcm  Is utd  (Had varicella vaccine and not disease ) Skin area seems like fibroma  Watch and fu if progressive  Patient Care Team: Tenleigh Byer, Standley Brooking, MD as PCP - General Brien Few, MD as Consulting Physician (Obstetrics and Gynecology) Juanita Craver, MD as Consulting Physician (Gastroenterology) Nicholas Lose, MD as Consulting Physician (Hematology and Oncology) Irene Limbo, MD as Consulting Physician (Plastic Surgery) Rolm Bookbinder, MD as Consulting Physician (General Surgery) Patient Instructions  Glad you are doing well .  Continue lifestyle intervention healthy eating and exercise .  Weight bearing  Get appt for fasting lab.     Health Maintenance, Female Adopting a healthy lifestyle and getting preventive care are important in promoting health and  wellness. Ask your health care provider about:  The right schedule for you to have regular tests and exams.  Things you can do on your own to prevent diseases and keep yourself healthy. What should I know about diet, weight, and exercise? Eat a healthy diet   Eat a diet that includes plenty of vegetables, fruits, low-fat dairy products, and lean protein.  Do not eat a lot of foods that are high in solid fats, added sugars, or sodium. Maintain  a healthy weight Body mass index (BMI) is used to identify weight problems. It estimates body fat based on height and weight. Your health care provider can help determine your BMI and help you achieve or maintain a healthy weight. Get regular exercise Get regular exercise. This is one of the most important things you can do for your health. Most adults should:  Exercise for at least 150 minutes each week. The exercise should increase your heart rate and make you sweat (moderate-intensity exercise).  Do strengthening exercises at least twice a week. This is in addition to the moderate-intensity exercise.  Spend less time sitting. Even light physical activity can be beneficial. Watch cholesterol and blood lipids Have your blood tested for lipids and cholesterol at 53 years of age, then have this test every 5 years. Have your cholesterol levels checked more often if:  Your lipid or cholesterol levels are high.  You are older than 53 years of age.  You are at high risk for heart disease. What should I know about cancer screening? Depending on your health history and family history, you may need to have cancer screening at various ages. This may include screening for:  Breast cancer.  Cervical cancer.  Colorectal cancer.  Skin cancer.  Lung cancer. What should I know about heart disease, diabetes, and high blood pressure? Blood pressure and heart disease  High blood pressure causes heart disease and increases the risk of stroke. This is more likely to develop in people who have high blood pressure readings, are of African descent, or are overweight.  Have your blood pressure checked: ? Every 3-5 years if you are 33-71 years of age. ? Every year if you are 31 years old or older. Diabetes Have regular diabetes screenings. This checks your fasting blood sugar level. Have the screening done:  Once every three years after age 51 if you are at a normal weight and have a low risk for  diabetes.  More often and at a younger age if you are overweight or have a high risk for diabetes. What should I know about preventing infection? Hepatitis B If you have a higher risk for hepatitis B, you should be screened for this virus. Talk with your health care provider to find out if you are at risk for hepatitis B infection. Hepatitis C Testing is recommended for:  Everyone born from [birth year] through 1965.  Anyone with known risk factors for hepatitis C. Sexually transmitted infections (STIs)  Get screened for STIs, including gonorrhea and chlamydia, if: ? You are sexually active and are younger than 53 years of age. ? You are older than 53 years of age and your health care provider tells you that you are at risk for this type of infection. ? Your sexual activity has changed since you were last screened, and you are at increased risk for chlamydia or gonorrhea. Ask your health care provider if you are at risk.  Ask your health care provider about whether you are at high  risk for HIV. Your health care provider may recommend a prescription medicine to help prevent HIV infection. If you choose to take medicine to prevent HIV, you should first get tested for HIV. You should then be tested every 3 months for as long as you are taking the medicine. Pregnancy  If you are about to stop having your period (premenopausal) and you may become pregnant, seek counseling before you get pregnant.  Take 400 to 800 micrograms (mcg) of folic acid every day if you become pregnant.  Ask for birth control (contraception) if you want to prevent pregnancy. Osteoporosis and menopause Osteoporosis is a disease in which the bones lose minerals and strength with aging. This can result in bone fractures. If you are 30 years old or older, or if you are at risk for osteoporosis and fractures, ask your health care provider if you should:  Be screened for bone loss.  Take a calcium or vitamin D supplement to lower  your risk of fractures.  Be given hormone replacement therapy (HRT) to treat symptoms of menopause. Follow these instructions at home: Lifestyle  Do not use any products that contain nicotine or tobacco, such as cigarettes, e-cigarettes, and chewing tobacco. If you need help quitting, ask your health care provider.  Do not use street drugs.  Do not share needles.  Ask your health care provider for help if you need support or information about quitting drugs. Alcohol use  Do not drink alcohol if: ? Your health care provider tells you not to drink. ? You are pregnant, may be pregnant, or are planning to become pregnant.  If you drink alcohol: ? Limit how much you use to 0-1 drink a day. ? Limit intake if you are breastfeeding.  Be aware of how much alcohol is in your drink. In the [place] one drink equals one 12 oz bottle of beer (355 mL), one 5 oz glass of wine (148 mL), or one 1 oz glass of hard liquor (44 mL). General instructions  Schedule regular health, dental, and eye exams.  Stay current with your vaccines.  Tell your health care provider if: ? You often feel depressed. ? You have ever been abused or do not feel safe at home. Summary  Adopting a healthy lifestyle and getting preventive care are important in promoting health and wellness.  Follow your health care provider's instructions about healthy diet, exercising, and getting tested or screened for diseases.  Follow your health care provider's instructions on monitoring your cholesterol and blood pressure. This information is not intended to replace advice given to you by your health care provider. Make sure you discuss any questions you have with your health care provider. Document Released: 10/10/2010 Document Revised: 03/20/2018 Document Reviewed: 03/20/2018 Elsevier Patient Education  2020 [place] Kesa Birky M.D.  form completed

## 2019-03-12 NOTE — Telephone Encounter (Signed)
Please advise I think you were the one to call

## 2019-03-13 ENCOUNTER — Other Ambulatory Visit: Payer: Self-pay

## 2019-03-14 ENCOUNTER — Other Ambulatory Visit (INDEPENDENT_AMBULATORY_CARE_PROVIDER_SITE_OTHER): Payer: Managed Care, Other (non HMO)

## 2019-03-14 DIAGNOSIS — Z0000 Encounter for general adult medical examination without abnormal findings: Secondary | ICD-10-CM | POA: Diagnosis not present

## 2019-03-14 DIAGNOSIS — Z170 Estrogen receptor positive status [ER+]: Secondary | ICD-10-CM

## 2019-03-14 DIAGNOSIS — Z8632 Personal history of gestational diabetes: Secondary | ICD-10-CM | POA: Diagnosis not present

## 2019-03-14 DIAGNOSIS — Z008 Encounter for other general examination: Secondary | ICD-10-CM

## 2019-03-14 DIAGNOSIS — C50211 Malignant neoplasm of upper-inner quadrant of right female breast: Secondary | ICD-10-CM

## 2019-03-14 LAB — HEPATIC FUNCTION PANEL
ALT: 21 U/L (ref 0–35)
AST: 15 U/L (ref 0–37)
Albumin: 3.8 g/dL (ref 3.5–5.2)
Alkaline Phosphatase: 39 U/L (ref 39–117)
Bilirubin, Direct: 0.1 mg/dL (ref 0.0–0.3)
Total Bilirubin: 0.4 mg/dL (ref 0.2–1.2)
Total Protein: 6.4 g/dL (ref 6.0–8.3)

## 2019-03-14 LAB — CBC WITH DIFFERENTIAL/PLATELET
Basophils Absolute: 0 10*3/uL (ref 0.0–0.1)
Basophils Relative: 0.5 % (ref 0.0–3.0)
Eosinophils Absolute: 0.1 10*3/uL (ref 0.0–0.7)
Eosinophils Relative: 1.3 % (ref 0.0–5.0)
HCT: 38.2 % (ref 36.0–46.0)
Hemoglobin: 12.5 g/dL (ref 12.0–15.0)
Lymphocytes Relative: 29.3 % (ref 12.0–46.0)
Lymphs Abs: 1.8 10*3/uL (ref 0.7–4.0)
MCHC: 32.7 g/dL (ref 30.0–36.0)
MCV: 85.8 fl (ref 78.0–100.0)
Monocytes Absolute: 0.4 10*3/uL (ref 0.1–1.0)
Monocytes Relative: 6.3 % (ref 3.0–12.0)
Neutro Abs: 3.8 10*3/uL (ref 1.4–7.7)
Neutrophils Relative %: 62.6 % (ref 43.0–77.0)
Platelets: 282 10*3/uL (ref 150.0–400.0)
RBC: 4.45 Mil/uL (ref 3.87–5.11)
RDW: 14.7 % (ref 11.5–15.5)
WBC: 6.1 10*3/uL (ref 4.0–10.5)

## 2019-03-14 LAB — LIPID PANEL
Cholesterol: 192 mg/dL (ref 0–200)
HDL: 40.1 mg/dL (ref >39.00)
LDL Cholesterol: 115 mg/dL — ABNORMAL HIGH (ref 0–99)
NonHDL: 151.46
Total CHOL/HDL Ratio: 5
Triglycerides: 183 mg/dL — ABNORMAL HIGH (ref 0.0–149.0)
VLDL: 36.6 mg/dL (ref 0.0–40.0)

## 2019-03-14 LAB — BASIC METABOLIC PANEL
BUN: 14 mg/dL (ref 6–23)
CO2: 26 mEq/L (ref 19–32)
Calcium: 8.9 mg/dL (ref 8.4–10.5)
Chloride: 105 mEq/L (ref 96–112)
Creatinine, Ser: 0.79 mg/dL (ref 0.40–1.20)
GFR: 76.02 mL/min (ref >60.00)
Glucose, Bld: 92 mg/dL (ref 70–99)
Potassium: 4.3 mEq/L (ref 3.5–5.1)
Sodium: 138 mEq/L (ref 135–145)

## 2019-03-14 LAB — TSH: TSH: 1.65 u[IU]/mL (ref 0.35–4.50)

## 2019-03-14 LAB — HEMOGLOBIN A1C: Hgb A1c MFr Bld: 5.5 % (ref 4.6–6.5)

## 2019-03-17 ENCOUNTER — Telehealth: Payer: Self-pay

## 2019-03-17 NOTE — Progress Notes (Signed)
Pt notified and form has been faxed

## 2019-03-17 NOTE — Telephone Encounter (Signed)
Pt informed pt's form is up front ready for pickup and has been faxed to [phone]

## 2019-03-18 ENCOUNTER — Encounter: Payer: Self-pay | Admitting: Physical Therapy

## 2019-03-18 ENCOUNTER — Ambulatory Visit: Payer: Managed Care, Other (non HMO) | Admitting: Physical Therapy

## 2019-03-18 ENCOUNTER — Other Ambulatory Visit: Payer: Self-pay

## 2019-03-18 DIAGNOSIS — Z483 Aftercare following surgery for neoplasm: Secondary | ICD-10-CM

## 2019-03-18 NOTE — Therapy (Signed)
[place], [place], [address] Phone: [phone]   Fax:  [phone]  Physical Therapy Treatment  Patient Details  Name: Megan Armstrong MRN: [id number] Date of Birth: 12/19/1965 Referring Provider (PT): Ave Filter Date: 03/18/2019  PT End of Session - 03/18/19 1454    Visit Number  31    Number of Visits  33    Date for PT Re-Evaluation  04/01/19    PT Start Time  1400    PT Stop Time  1454    PT Time Calculation (min)  54 min    Activity Tolerance  Patient tolerated treatment well    Behavior During Therapy  [facility] for tasks assessed/performed       Past Medical History:  Diagnosis Date  . Allergic rhinitis   . Endometrial hyperplasia   . Endometriosis   . Family history of breast cancer   . Family history of breast cancer   . Hx of endometriosis    rectal vag with laser surgery   . Seasonal allergies   . 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 DIAGNOSTIC LAPAROSCOPY    . MASTECTOMY W/ SENTINEL NODE BIOPSY Bilateral 09/16/2018   Procedure: RIGHT MASTECTOMY WITH RIGHT AXILLARY SENTINEL LYMPH NODE BIOPSY AND LEFT RISK REDUCING MASTECTOMY;  Surgeon: Rolm Bookbinder, MD;  Location: [facility];  Service: General;  Laterality: Bilateral;    There were no vitals filed for this visit.  Subjective Assessment - 03/18/19 1402    Subjective  I have a bigger relief across the chest but still have discomfort under the armpits. It goes away and then comes back. The pain stays gone for 48 hours after a session of PT and then comes back.    Pertinent History  R breast cancer with bilateral mastectomy on 09/16/18 and SLNB (2 nodes both negative), does not need chemo or radiation, endometriosis    Patient Stated Goals  to resume normal functions, drive again, typing, get ROM back     Currently in Pain?  No/denies    Pain Score  0-No pain                       [facility] Adult PT Treatment/Exercise - 03/18/19 0001      Manual Therapy   Soft tissue mobilization  in L s/l using biotone to bilateral trunk and inferior axilla along serratus anterior and lattisimus in area of tightness and discomfort then to opposite side, pt had increased palpable tightness bilaterally especially along serratus                  PT Long Term Goals - 02/25/19 1405      PT LONG TERM GOAL #1   Title  Pt will demonstrate 160 degrees of bilateral shoulder flexion to allow her to reach overhead.    Baseline  R 115, L 105, 10/31/18- R 160 L 156, 820/20- R 166 L 162    Time  4    Period  Weeks    Status  Achieved      PT LONG TERM GOAL #2   Title  Pt will demonstrate 160 degrees of bilateral shoulder abduction to allow her to reach out to the side.    Baseline  R 91, L 77, 10/31/18- R 175 L 180  Time  4    Period  Weeks    Status  Achieved      PT LONG TERM GOAL #3   Title  Pt will be independent in a home exercise program for continued strengthening and stretching    Baseline  12/31/18- pt is independent and obtained a foam roller for stretches at home    Time  4    Period  Weeks    Status  Achieved      PT LONG TERM GOAL #4   Title  Pt will report a 75% improvement in pain across chest to allow her to return to prior level of function.    Baseline  10/31/18- 75% improvement    Time  4    Period  Weeks    Status  Achieved      PT LONG TERM GOAL #5   Title  Pt will report a 75% improvement in swelling in bilateral lateral trunk and inferior axilla to allow improved comfort.    Baseline  10/31/18- 60% improvement, 11/28/18- pt has developed swelling over her vacation- so no improvement as of now, 12/31/18- 40% improved, 01/28/19- 40%, 02/25/19- 50% improved    Time  4    Status  On-going      PT LONG TERM GOAL #6   Title  Pt will experience no pain from  sciatica in RLE during the day to allow pt to return to PLOF    Baseline  10/31/18- currently pain is between 5 and 6/10, 11/28/18- 4 or 5/10, 12/31/18- pt is no longer having pain    Time  4    Period  Weeks    Status  Achieved            Plan - 03/18/19 1450    Clinical Impression Statement  Pt with palpable tightness and muscle "knots" along bilateral serratus that is causing discomfort. She reports relief from therapy lasts 48 hours before it begins hurting again. Her swelling seems more controlled today but the tightness is more palpable than it had been. Pt reports the tightness is completely relieved at end of session.    PT Frequency  1x / week    PT Duration  --   5 weeks   PT Treatment/Interventions  ADLs/Self Care Home Management;Therapeutic activities;Therapeutic exercise;Patient/family education;Manual techniques;Manual lymph drainage;Scar mobilization;Passive range of motion;Taping;Joint Manipulations    PT Next Visit Plan  myofascial and STM to bilateral serratus and edge of lats    PT Home Exercise Plan  post op breast exercises, supine cane exercises, supine scap, Strength ABC program    Consulted and Agree with Plan of Care  Patient       Patient will benefit from skilled therapeutic intervention in order to improve the following deficits and impairments:  Decreased knowledge of precautions, Decreased range of motion, Decreased scar mobility, Increased edema, Decreased strength, Increased fascial restricitons, Pain, Postural dysfunction  Visit Diagnosis: Aftercare following surgery for neoplasm     Problem List Patient Active Problem List   Diagnosis Date Noted  . Breast cancer, right ([facility]) 09/16/2018  . Genetic testing 09/09/2018  . Malignant neoplasm of upper-inner quadrant of right breast in female, estrogen receptor positive ([facility]) 08/19/2018  . Family history of breast cancer   . History of penicillin allergy 09/24/2012  . Exposure to strep throat  09/24/2012  . Perioral dermatitis 09/24/2012  . Left otitis media with effusion 08/16/2011  . Hx of antibiotic allergy 05/03/2011  . Unspecified disorder of  the teeth and supporting structures 03/30/2008  . ALLERGIC RHINITIS 02/17/2008  . ENDOMETRIOSIS 02/17/2008  . HEADACHE 02/17/2008  . Hx gestational diabetes 02/17/2008  . POSITIVE PPD 12/17/2007    Allyson Sabal [facility] 03/18/2019, 2:55 PM  [place], [place], [address] Phone: [phone]   Fax:  [phone]  Name: Megan Armstrong MRN: [id number] Date of Birth: 1965/05/26  Manus Gunning, PT 03/18/19 2:55 PM

## 2019-03-25 ENCOUNTER — Encounter: Payer: Self-pay | Admitting: Physical Therapy

## 2019-03-25 ENCOUNTER — Ambulatory Visit: Payer: Managed Care, Other (non HMO) | Admitting: Physical Therapy

## 2019-03-25 ENCOUNTER — Other Ambulatory Visit: Payer: Self-pay

## 2019-03-25 DIAGNOSIS — Z483 Aftercare following surgery for neoplasm: Secondary | ICD-10-CM | POA: Diagnosis not present

## 2019-03-25 NOTE — Therapy (Signed)
[place], [place], [address] Phone: [phone]   Fax:  [phone]  Physical Therapy Treatment  Patient Details  Name: Megan Armstrong MRN: [id number] Date of Birth: Apr 05, 1966 Referring Provider (PT): Ave Filter Date: 03/25/2019  PT End of Session - 03/25/19 1454    Visit Number  32    Number of Visits  33    Date for PT Re-Evaluation  04/01/19    PT Start Time  [id number]    PT Stop Time  1457    PT Time Calculation (min)  54 min    Activity Tolerance  Patient tolerated treatment well    Behavior During Therapy  [facility] for tasks assessed/performed       Past Medical History:  Diagnosis Date  . Allergic rhinitis   . Endometrial hyperplasia   . Endometriosis   . Family history of breast cancer   . Family history of breast cancer   . Hx of endometriosis    rectal vag with laser surgery   . Seasonal allergies   . 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 DIAGNOSTIC LAPAROSCOPY    . MASTECTOMY W/ SENTINEL NODE BIOPSY Bilateral 09/16/2018   Procedure: RIGHT MASTECTOMY WITH RIGHT AXILLARY SENTINEL LYMPH NODE BIOPSY AND LEFT RISK REDUCING MASTECTOMY;  Surgeon: Rolm Bookbinder, MD;  Location: [place];  Service: General;  Laterality: Bilateral;    There were no vitals filed for this visit.  Subjective Assessment - 03/25/19 1405    Subjective  I am fine. I may be a little bit better than last week. A little bit looser.    Pertinent History  R breast cancer with bilateral mastectomy on 09/16/18 and SLNB (2 nodes both negative), does not need chemo or radiation, endometriosis    Patient Stated Goals  to resume normal functions, drive again, typing, get ROM back    Currently in Pain?  No/denies    Pain Score  0-No pain                       [facility] Adult PT  Treatment/Exercise - 03/25/19 0001      Manual Therapy   Soft tissue mobilization  in L s/l using biotone to bilateral trunk and inferior axilla along serratus anterior and lattisimus in area of tightness and discomfort then to opposite side, pt had increased palpable tightness bilaterally especially along serratus                  PT Long Term Goals - 02/25/19 1405      PT LONG TERM GOAL #1   Title  Pt will demonstrate 160 degrees of bilateral shoulder flexion to allow her to reach overhead.    Baseline  R 115, L 105, 10/31/18- R 160 L 156, 820/20- R 166 L 162    Time  4    Period  Weeks    Status  Achieved      PT LONG TERM GOAL #2   Title  Pt will demonstrate 160 degrees of bilateral shoulder abduction to allow her to reach out to the side.    Baseline  R 91, L 77, 10/31/18- R 175 L 180    Time  4    Period  Weeks    Status  Achieved  PT LONG TERM GOAL #3   Title  Pt will be independent in a home exercise program for continued strengthening and stretching    Baseline  12/31/18- pt is independent and obtained a foam roller for stretches at home    Time  4    Period  Weeks    Status  Achieved      PT LONG TERM GOAL #4   Title  Pt will report a 75% improvement in pain across chest to allow her to return to prior level of function.    Baseline  10/31/18- 75% improvement    Time  4    Period  Weeks    Status  Achieved      PT LONG TERM GOAL #5   Title  Pt will report a 75% improvement in swelling in bilateral lateral trunk and inferior axilla to allow improved comfort.    Baseline  10/31/18- 60% improvement, 11/28/18- pt has developed swelling over her vacation- so no improvement as of now, 12/31/18- 40% improved, 01/28/19- 40%, 02/25/19- 50% improved    Time  4    Status  On-going      PT LONG TERM GOAL #6   Title  Pt will experience no pain from sciatica in RLE during the day to allow pt to return to PLOF    Baseline  10/31/18- currently pain is between 5 and 6/10,  11/28/18- 4 or 5/10, 12/31/18- pt is no longer having pain    Time  4    Period  Weeks    Status  Achieved            Plan - 03/25/19 1455    Clinical Impression Statement  Pt still with palpable knots along bilateral trunk and inferior axilla that released with treatment. Pt was educated on how to buy a roller ball for massage to continue to work on this area at home after d/c from PT. She plans on ordering one. She does report being compliant with her stretches and states the stretches do help to decrease discomfort.    PT Frequency  1x / week    PT Duration  --   5 weeks   PT Treatment/Interventions  ADLs/Self Care Home Management;Therapeutic activities;Therapeutic exercise;Patient/family education;Manual techniques;Manual lymph drainage;Scar mobilization;Passive range of motion;Taping;Joint Manipulations    PT Next Visit Plan  d/c ?myofascial and STM to bilateral serratus and edge of lats    PT Home Exercise Plan  post op breast exercises, supine cane exercises, supine scap, Strength ABC program       Patient will benefit from skilled therapeutic intervention in order to improve the following deficits and impairments:  Decreased knowledge of precautions, Decreased range of motion, Decreased scar mobility, Increased edema, Decreased strength, Increased fascial restricitons, Pain, Postural dysfunction  Visit Diagnosis: Aftercare following surgery for neoplasm     Problem List Patient Active Problem List   Diagnosis Date Noted  . Breast cancer, right ([place]) 09/16/2018  . Genetic testing 09/09/2018  . Malignant neoplasm of upper-inner quadrant of right breast in female, estrogen receptor positive ([place]) 08/19/2018  . Family history of breast cancer   . History of penicillin allergy 09/24/2012  . Exposure to strep throat 09/24/2012  . Perioral dermatitis 09/24/2012  . Left otitis media with effusion 08/16/2011  . Hx of antibiotic allergy 05/03/2011  . Unspecified disorder of the  teeth and supporting structures 03/30/2008  . ALLERGIC RHINITIS 02/17/2008  . ENDOMETRIOSIS 02/17/2008  . HEADACHE 02/17/2008  . Hx gestational  diabetes 02/17/2008  . POSITIVE PPD 12/17/2007    Allyson Sabal [facility] 03/25/2019, 2:59 PM  [place], [place], [address] Phone: [phone]   Fax:  [phone]  Name: Megan Armstrong MRN: [id number] Date of Birth: Jan 29, 1966  Manus Gunning, PT 03/25/19 3:00 PM

## 2019-03-25 NOTE — Progress Notes (Signed)
Patient Care Team: Panosh, Standley Brooking, MD as PCP - General Megan Few, MD as Consulting Physician (Obstetrics and Gynecology) Megan Craver, MD as Consulting Physician (Gastroenterology) Megan Lose, MD as Consulting Physician (Hematology and Oncology) Megan Limbo, MD as Consulting Physician (Plastic Surgery) Megan Bookbinder, MD as Consulting Physician (General Surgery)  DIAGNOSIS:    ICD-10-CM   1. Malignant neoplasm of upper-inner quadrant of right breast in female, estrogen receptor positive ([place])  C50.211    Z17.0     SUMMARY OF ONCOLOGIC HISTORY: Oncology History  Malignant neoplasm of upper-inner quadrant of right breast in female, estrogen receptor positive ([place])  08/13/2018 Initial Diagnosis   Palpable mass at 1230 to 1 o'clock position right breast with skin indentation and focal dimpling UOQ, ultrasound to masses.  12:30 position: 2 cm mass, at 1 o'clock position: 0.9 cm mass, together 3.4 cm.  UOQ: 2 additional masses 0.7 cm and 0.7 cm, indeterminate retroareolar calcifications.   08/13/2018 Pathology Results   Right breast needle core biopsy 1230: Grade 2 ILC with LCIS, UOQ 10:30 position: Grade 2 ILC with LCIS, ER 90%, PR 30%, Ki-67 10%, HER-2 -1+ by IHC.  T1c N0 stage Ia clinical stage   09/06/2018 Genetic Testing   Negative genetic testing on the CustomNext-Cancer+RNAinsight testing. 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  The CustomNext-Expanded gene panel offered by [facility] and includes sequencing and rearrangement analysis for the following 81 genes: AIP, ALK, APC*, ATM*, AXIN2, BAP1, BARD1, BLM, BMPR1A, BRCA1*, BRCA2*, BRIP1*, CDC73, CDH1*, CDK4, CDKN1B, CDKN2A, CHEK2*, CTNNA1, DICER1, FANCC, FH, FLCN, GALNT12, HOXB13, KIT, MAX, MEN1, MET, MLH1*, MRE11A, MSH2*, MSH6*, MUTYH*, NBN, NF1*, NF2, NTHL1, PALB2*, PDGFRA, PHOX2B, PMS2*, POLD1, POLE, POT1, PRKAR1A, PTCH1, PTEN*, RAD50, RAD51C*, RAD51D*, RB1, RET, SDHA, SDHAF2, SDHB, SDHC, SDHD, SMAD4, SMARCA4,  SMARCB1, SMARCE1, STK11, SUFU, TMEM127, TP53*, TSC1, TSC2, VHL and XRCC2 (sequencing and deletion/duplication); CASR, CFTR, CPA1, CTRC, EGFR, MITF, PRSS1 and SPINK1 (sequencing only); EPCAM and GREM1 (deletion/duplication only). DNA and RNA analyses performed for * genes.  The report date is Sep 06, 2018.   09/16/2018 Surgery   Bilateral mastectomies: Left mastectomy benign, right mastectomy: Invasive lobular cancer, multifocal, largest tumor 3.2 cm, grade 2, margins negative, 1/2 lymph nodes with isolated tumor cells, ER 90%, PR 70%, HER-2 -1+, Ki-67 10% T2 N0 I+ stage Ia   09/16/2018 Oncotype testing   17/5%   09/20/2018 Cancer Staging   Staging form: Breast, AJCC 8th Edition - Pathologic stage from 09/20/2018: Stage IA (pT2, pN0(i+)(sn), cM0, G2, ER+, PR+, HER2-) - Signed by Megan Lose, MD on 09/20/2018   09/2018 -  Anti-estrogen oral therapy   Tamoxifen daily     CHIEF COMPLIANT: Follow-up of right breast cancer on tamoxifen  INTERVAL HISTORY: Megan Armstrong is a 53 y.o. with above-mentioned history of right breast cancer who underwent bilateral mastectomies and is currently on anti-estrogen therapy with tamoxifen. She presents to the clinic today for follow-up.   REVIEW OF SYSTEMS:   Constitutional: Denies fevers, chills or abnormal weight loss Eyes: Denies blurriness of vision Ears, nose, mouth, throat, and face: Denies mucositis or sore throat Respiratory: Denies cough, dyspnea or wheezes Cardiovascular: Denies palpitation, chest discomfort Gastrointestinal: Denies nausea, heartburn or change in bowel habits Skin: Denies abnormal skin rashes Lymphatics: Denies new lymphadenopathy or easy bruising Neurological: Denies numbness, tingling or new weaknesses Behavioral/Psych: Mood is stable, no new changes  Extremities: No lower extremity edema Breast: denies any pain or lumps or nodules in either breasts All other systems were  reviewed with the patient and are negative.  I have  reviewed the past medical history, past surgical history, social history and family history with the patient and they are unchanged from previous note.  ALLERGIES:  is allergic to codeine; penicillins; clindamycin/lincomycin; and keflex [cephalexin].  MEDICATIONS:  Current Outpatient Medications  Medication Sig Dispense Refill  . Multiple Vitamins-Minerals (CENTRUM ULTRA WOMENS PO) Take by mouth.    . tamoxifen (NOLVADEX) 20 MG tablet TAKE 1 TABLET(20 MG) BY MOUTH DAILY 90 tablet 0  . vitamin C (ASCORBIC ACID) 250 MG tablet Take 250 mg by mouth daily.     No current facility-administered medications for this visit.    PHYSICAL EXAMINATION: ECOG PERFORMANCE STATUS: 1 - Symptomatic but completely ambulatory  Vitals:   03/26/19 0941  BP: 128/76  Pulse: 73  Resp: 18  Temp: 98.3 F (36.8 C)  SpO2: 100%   Filed Weights   03/26/19 0941  Weight: 118 lb 9.6 oz (53.8 kg)    GENERAL: alert, no distress and comfortable SKIN: skin color, texture, turgor are normal, no rashes or significant lesions EYES: normal, Conjunctiva are pink and non-injected, sclera clear OROPHARYNX: no exudate, no erythema and lips, buccal mucosa, and tongue normal  NECK: supple, thyroid normal size, non-tender, without nodularity LYMPH: no palpable lymphadenopathy in the cervical, axillary or inguinal LUNGS: clear to auscultation and percussion with normal breathing effort HEART: regular rate & rhythm and no murmurs and no lower extremity edema ABDOMEN: abdomen soft, non-tender and normal bowel sounds MUSCULOSKELETAL: no cyanosis of digits and no clubbing  NEURO: alert & oriented x 3 with fluent speech, no focal motor/sensory deficits EXTREMITIES: No lower extremity edema  LABORATORY DATA:  I have reviewed the data as listed CMP Latest Ref Rng & Units 03/14/2019 04/08/2018 01/22/2017  Glucose 70 - 99 mg/dL 92 86 97  BUN 6 - 23 mg/dL _0 Creatinine 0.40 - 1.20 mg/dL 0.79 0.81 0.74  Sodium 135 - 145  mEq/L 138 135 138  Potassium 3.5 - 5.1 mEq/L 4.3 4.1 4.8  Chloride 96 - 112 mEq/L 105 103 105  CO2 19 - 32 mEq/L _1 Calcium 8.4 - 10.5 mg/dL 8.9 9.2 9.1  Total Protein 6.0 - 8.3 g/dL 6.4 6.7 6.5  Total Bilirubin 0.2 - 1.2 mg/dL 0.4 0.4 0.5  Alkaline Phos 39 - 117 U/L 39 40 41  AST 0 - 37 U/L _2 ALT 0 - 35 U/L _3 Lab Results  Component Value Date   WBC 6.1 03/14/2019   HGB 12.5 03/14/2019   HCT 38.2 03/14/2019   MCV 85.8 03/14/2019   PLT 282.0 03/14/2019   NEUTROABS 3.8 03/14/2019    ASSESSMENT & PLAN:  Malignant neoplasm of upper-inner quadrant of right breast in female, estrogen receptor positive ([facility]) 08/13/2018:Palpable mass at 1230 to 1 o'clock position right breast with skin indentation and focal dimpling UOQ, ultrasound to masses. 12:30 position: 2 cm mass, at 1 o'clock position: 0.9 cm mass, together 3.4 cm. UOQ: 2 additional masses 0.7 cm and 0.7 cm, indeterminate retroareolar calcifications.  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 T1c N0 stage Ia clinical stage  09/16/2018:Bilateral mastectomies: Left mastectomy benign, right mastectomy: Invasive lobular cancer, multifocal, largest tumor 3.2 cm, grade 2, margins negative, 1/2 lymph nodes with isolated tumor cells, ER 90%, PR  70%, HER-2 -1+, Ki-67 10% T2 N0 I+ stage Ia  Recommendations: 1.Oncotype DX score: 17, risk of distant recurrence 5% at 9 years: No chemo 2. Adjuvant antiestrogen therapywith tamoxifen since she is still premenopausal started 09/28/2018 --------------------------------------------------------------------------------------------------------------------------------- Tamoxifen toxicities: Denies any lumps or nodules  Breast cancer surveillance: 1.  Chest wall examination 03/26/2019: No palpable lumps or nodules of concern 2.  No role of imaging studies because she had  bilateral mastectomies. given her history of uterine hypertrophy, I recommended annual eval on her uterus (sent message to [facility])  Return to clinic in 1 year for follow-up    No orders of the defined types were placed in this encounter.  The patient has a good understanding of the overall plan. she agrees with it. she will call with any problems that may develop before the next visit here.  Megan Lose, MD 03/26/2019  Julious Oka Dorshimer, am acting as scribe for Dr. Nicholas Armstrong.  I have reviewed the above document for accuracy and completeness, and I agree with the above.

## 2019-03-26 ENCOUNTER — Inpatient Hospital Stay: Payer: Managed Care, Other (non HMO) | Attending: Hematology and Oncology | Admitting: Hematology and Oncology

## 2019-03-26 ENCOUNTER — Other Ambulatory Visit: Payer: Self-pay

## 2019-03-26 DIAGNOSIS — Z170 Estrogen receptor positive status [ER+]: Secondary | ICD-10-CM | POA: Diagnosis not present

## 2019-03-26 DIAGNOSIS — Z79810 Long term (current) use of selective estrogen receptor modulators (SERMs): Secondary | ICD-10-CM | POA: Insufficient documentation

## 2019-03-26 DIAGNOSIS — C50211 Malignant neoplasm of upper-inner quadrant of right female breast: Secondary | ICD-10-CM | POA: Insufficient documentation

## 2019-03-26 NOTE — Assessment & Plan Note (Signed)
08/13/2018:Palpable mass at 1230 to 1 o'clock position right breast with skin indentation and focal dimpling UOQ, ultrasound to masses. 12:30 position: 2 cm mass, at 1 o'clock position: 0.9 cm mass, together 3.4 cm. UOQ: 2 additional masses 0.7 cm and 0.7 cm, indeterminate retroareolar calcifications.  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 T1c N0 stage Ia clinical stage  09/16/2018:Bilateral mastectomies: Left mastectomy benign, right mastectomy: Invasive lobular cancer, multifocal, largest tumor 3.2 cm, grade 2, margins negative, 1/2 lymph nodes with isolated tumor cells, ER 90%, PR 70%, HER-2 -1+, Ki-67 10% T2 N0 I+ stage Ia  Recommendations: 1.Oncotype DX score: 17, risk of distant recurrence 5% at 9 years: No chemo 2. Adjuvant antiestrogen therapywith tamoxifen since she is still premenopausal started 09/28/2018 --------------------------------------------------------------------------------------------------------------------------------- Tamoxifen toxicities:  Breast cancer surveillance: 1.  Chest wall examination 03/26/2019: No palpable lumps or nodules of concern 2.  No role of imaging studies because she had bilateral mastectomies.  Return to clinic in 1 year for follow-up

## 2019-03-27 ENCOUNTER — Telehealth: Payer: Self-pay | Admitting: Hematology and Oncology

## 2019-03-27 NOTE — Telephone Encounter (Signed)
I talk with the patient regarding schedule

## 2019-04-01 ENCOUNTER — Other Ambulatory Visit: Payer: Self-pay

## 2019-04-01 ENCOUNTER — Ambulatory Visit: Payer: Managed Care, Other (non HMO) | Admitting: Physical Therapy

## 2019-04-01 ENCOUNTER — Encounter: Payer: Self-pay | Admitting: Physical Therapy

## 2019-04-01 DIAGNOSIS — Z483 Aftercare following surgery for neoplasm: Secondary | ICD-10-CM

## 2019-04-01 DIAGNOSIS — R600 Localized edema: Secondary | ICD-10-CM

## 2019-04-01 NOTE — Therapy (Signed)
[place], [place], [address] Phone: [phone]   Fax:  [phone]  Physical Therapy Treatment  Patient Details  Name: Megan Armstrong MRN: [id number] Date of Birth: October 13, 1965 Referring Provider (PT): Donne Hazel   Encounter Date: 04/01/2019  PT End of Session - 04/01/19 1451    Visit Number  33    Number of Visits  33    Date for PT Re-Evaluation  04/01/19    PT Start Time  8003    PT Stop Time  1452    PT Time Calculation (min)  45 min    Activity Tolerance  Patient tolerated treatment well    Behavior During Therapy  [facility] for tasks assessed/performed       Past Medical History:  Diagnosis Date  . Allergic rhinitis   . Endometrial hyperplasia   . Endometriosis   . Family history of breast cancer   . Family history of breast cancer   . Hx of endometriosis    rectal vag with laser surgery   . Seasonal allergies   . 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 DIAGNOSTIC LAPAROSCOPY    . MASTECTOMY W/ SENTINEL NODE BIOPSY Bilateral 09/16/2018   Procedure: RIGHT MASTECTOMY WITH RIGHT AXILLARY SENTINEL LYMPH NODE BIOPSY AND LEFT RISK REDUCING MASTECTOMY;  Surgeon: Rolm Bookbinder, MD;  Location: [place];  Service: General;  Laterality: Bilateral;    There were no vitals filed for this visit.  Subjective Assessment - 04/01/19 1409    Subjective  I feel pretty much about the same. I was fine until yesterday. I had some tightness and pulling.    Pertinent History  R breast cancer with bilateral mastectomy on 09/16/18 and SLNB (2 nodes both negative), does not need chemo or radiation, endometriosis    Patient Stated Goals  to resume normal functions, drive again, typing, get ROM back    Currently in Pain?  No/denies    Pain Score  0-No pain                        [facility] Adult PT Treatment/Exercise - 04/01/19 0001      Manual Therapy   Edema Management  educated pt about purchasing a compression shirt from a sporting good store to help with axillary edema on left and wearing chip pack over the area    Soft tissue mobilization  in L s/l using biotone to bilateral trunk and inferior axilla along serratus anterior and lattisimus in area of tightness and discomfort then to opposite side, pt had more tightness along right lateral trunk but this has overall improved since last visit    Manual Lymphatic Drainage (MLD)  in supine: short neck, 5 diaphragmatic breaths, left inguinal nodes and establishment of axillo inguinal pathway, L lateral trunk and axilla moving fluid towards pathway then repeating all steps                  PT Long Term Goals - 04/01/19 1411      PT LONG TERM GOAL #1   Title  Pt will demonstrate 160 degrees of bilateral shoulder flexion to allow her to reach overhead.    Baseline  R 115, L 105, 10/31/18- R 160 L 156, 820/20- R 166 L 162    Time  4    Period  Weeks    Status  Achieved      PT LONG TERM GOAL #2   Title  Pt will demonstrate 160 degrees of bilateral shoulder abduction to allow her to reach out to the side.    Baseline  R 91, L 77, 10/31/18- R 175 L 180    Time  4    Period  Weeks    Status  Achieved      PT LONG TERM GOAL #3   Title  Pt will be independent in a home exercise program for continued strengthening and stretching    Baseline  12/31/18- pt is independent and obtained a foam roller for stretches at home    Time  4    Period  Weeks    Status  Achieved      PT LONG TERM GOAL #4   Title  Pt will report a 75% improvement in pain across chest to allow her to return to prior level of function.    Baseline  10/31/18- 75% improvement    Time  4    Period  Weeks    Status  Achieved      PT LONG TERM GOAL #5   Title  Pt will report a 75% improvement in swelling in  bilateral lateral trunk and inferior axilla to allow improved comfort.    Baseline  10/31/18- 60% improvement, 11/28/18- pt has developed swelling over her vacation- so no improvement as of now, 12/31/18- 40% improved, 01/28/19- 40%, 02/25/19- 50% improved, 04/01/19- 60% improvement    Period  Weeks    Status  Partially met     PT LONG TERM GOAL #6   Title  Pt will experience no pain from sciatica in RLE during the day to allow pt to return to PLOF    Baseline  10/31/18- currently pain is between 5 and 6/10, 11/28/18- 4 or 5/10, 12/31/18- pt is no longer having pain    Time  4    Period  Weeks    Status  Achieved            Plan - 04/01/19 1454    Clinical Impression Statement  Pt has been doing her stretches and the tightness has been more controlled. She is still having swelling mostly in her left axilla which was not the side the lymph nodes were removed on. Educated pt about purchasing a compression shirt from a sporting good store and using her chip pack to help manage this. Pt still plans on purchasing a roller ball to massage bilateral lateral trunk in area of tightness. At this point she is ready for d/c from skilled PT services.    PT Frequency  1x / week    PT Duration  --   5 weeks   PT Treatment/Interventions  ADLs/Self Care Home Management;Therapeutic activities;Therapeutic exercise;Patient/family education;Manual techniques;Manual lymph drainage;Scar mobilization;Passive range of motion;Taping;Joint Manipulations    PT Next Visit Plan  d/c this visit    PT Home Exercise Plan  post op breast exercises, supine cane exercises, supine scap, Strength ABC program    Consulted and Agree with Plan of Care  Patient       Patient will benefit from skilled therapeutic intervention in order to improve the following deficits and impairments:  Decreased knowledge of precautions, Decreased range of motion, Decreased scar mobility, Increased edema, Decreased strength, Increased fascial  restricitons, Pain, Postural dysfunction  Visit Diagnosis: Aftercare following surgery for neoplasm  Localized edema     Problem List  Patient Active Problem List   Diagnosis Date Noted  . Breast cancer, right ([place]) 09/16/2018  . Genetic testing 09/09/2018  . Malignant neoplasm of upper-inner quadrant of right breast in female, estrogen receptor positive ([place]) 08/19/2018  . Family history of breast cancer   . History of penicillin allergy 09/24/2012  . Exposure to strep throat 09/24/2012  . Perioral dermatitis 09/24/2012  . Left otitis media with effusion 08/16/2011  . Hx of antibiotic allergy 05/03/2011  . Unspecified disorder of the teeth and supporting structures 03/30/2008  . ALLERGIC RHINITIS 02/17/2008  . ENDOMETRIOSIS 02/17/2008  . HEADACHE 02/17/2008  . Hx gestational diabetes 02/17/2008  . POSITIVE PPD 12/17/2007    Allyson Sabal [facility] 04/01/2019, 2:58 PM  [place], [place], [address] Phone: [phone]   Fax:  [phone]  Name: Megan Armstrong MRN: [id number] Date of Birth: [birth year]  Manus Gunning, PT 04/01/19 2:58 PM  PHYSICAL THERAPY DISCHARGE SUMMARY  Visits from Start of Care: 33  Current functional level related to goals / functional outcomes: All goals met, one goal partially met, pt with full ROM   Remaining deficits: Some swelling still in left axilla   Education / Equipment: HEP, self MLD, compression Plan: Patient agrees to discharge.  Patient goals were met. Patient is being discharged due to meeting the stated rehab goals.  ?????    Allyson Sabal [place], [place] 04/01/19 2:59 PM

## 2019-04-15 ENCOUNTER — Other Ambulatory Visit: Payer: Self-pay | Admitting: Hematology and Oncology

## 2019-04-21 ENCOUNTER — Encounter: Payer: Managed Care, Other (non HMO) | Admitting: Internal Medicine

## 2019-07-27 ENCOUNTER — Other Ambulatory Visit: Payer: Self-pay | Admitting: Hematology and Oncology

## 2019-10-15 ENCOUNTER — Telehealth: Payer: Self-pay | Admitting: Hematology and Oncology

## 2019-10-15 NOTE — Telephone Encounter (Signed)
Scheduled appt per 7/7 sch msg - pt is aware of appt date and time

## 2019-10-19 NOTE — Progress Notes (Signed)
Patient Care Team: Panosh, Standley Brooking, MD as PCP - General Brien Few, MD as Consulting Physician (Obstetrics and Gynecology) Juanita Craver, MD as Consulting Physician (Gastroenterology) Nicholas Lose, MD as Consulting Physician (Hematology and Oncology) Irene Limbo, MD as Consulting Physician (Plastic Surgery) Rolm Bookbinder, MD as Consulting Physician (General Surgery)  DIAGNOSIS:    ICD-10-CM   1. Malignant neoplasm of upper-inner quadrant of right breast in female, estrogen receptor positive ([place])  C50.211    Z17.0     SUMMARY OF ONCOLOGIC HISTORY: Oncology History  Malignant neoplasm of upper-inner quadrant of right breast in female, estrogen receptor positive ([place])  08/13/2018 Initial Diagnosis   Palpable mass at 1230 to 1 o'clock position right breast with skin indentation and focal dimpling UOQ, ultrasound to masses.  12:30 position: 2 cm mass, at 1 o'clock position: 0.9 cm mass, together 3.4 cm.  UOQ: 2 additional masses 0.7 cm and 0.7 cm, indeterminate retroareolar calcifications.   08/13/2018 Pathology Results   Right breast needle core biopsy 1230: Grade 2 ILC with LCIS, UOQ 10:30 position: Grade 2 ILC with LCIS, ER 90%, PR 30%, Ki-67 10%, HER-2 -1+ by IHC.  T1c N0 stage Ia clinical stage   09/06/2018 Genetic Testing   Negative genetic testing on the CustomNext-Cancer+RNAinsight testing. 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  The CustomNext-Expanded gene panel offered by [facility] and includes sequencing and rearrangement analysis for the following 81 genes: AIP, ALK, APC*, ATM*, AXIN2, BAP1, BARD1, BLM, BMPR1A, BRCA1*, BRCA2*, BRIP1*, CDC73, CDH1*, CDK4, CDKN1B, CDKN2A, CHEK2*, CTNNA1, DICER1, FANCC, FH, FLCN, GALNT12, HOXB13, KIT, MAX, MEN1, MET, MLH1*, MRE11A, MSH2*, MSH6*, MUTYH*, NBN, NF1*, NF2, NTHL1, PALB2*, PDGFRA, PHOX2B, PMS2*, POLD1, POLE, POT1, PRKAR1A, PTCH1, PTEN*, RAD50, RAD51C*, RAD51D*, RB1, RET, SDHA, SDHAF2, SDHB, SDHC, SDHD, SMAD4, SMARCA4,  SMARCB1, SMARCE1, STK11, SUFU, TMEM127, TP53*, TSC1, TSC2, VHL and XRCC2 (sequencing and deletion/duplication); CASR, CFTR, CPA1, CTRC, EGFR, MITF, PRSS1 and SPINK1 (sequencing only); EPCAM and GREM1 (deletion/duplication only). DNA and RNA analyses performed for * genes.  The report date is Sep 06, 2018.   09/16/2018 Surgery   Bilateral mastectomies: Left mastectomy benign, right mastectomy: Invasive lobular cancer, multifocal, largest tumor 3.2 cm, grade 2, margins negative, 1/2 lymph nodes with isolated tumor cells, ER 90%, PR 70%, HER-2 -1+, Ki-67 10% T2 N0 I+ stage Ia   09/16/2018 Oncotype testing   17/5%   09/20/2018 Cancer Staging   Staging form: Breast, AJCC 8th Edition - Pathologic stage from 09/20/2018: Stage IA (pT2, pN0(i+)(sn), cM0, G2, ER+, PR+, HER2-) - Signed by Nicholas Lose, MD on 09/20/2018   09/2018 -  Anti-estrogen oral therapy   Tamoxifen daily     CHIEF COMPLIANT: Follow-up of right breast cancer on tamoxifen  INTERVAL HISTORY: Megan Armstrong is a 54 y.o. with above-mentioned history of right breast cancer who underwent bilateral mastectomies and is currently on anti-estrogen therapy with tamoxifen. She presents to the clinic today for follow-up.    ALLERGIES:  is allergic to codeine, penicillins, clindamycin/lincomycin, and keflex [cephalexin].  MEDICATIONS:  Current Outpatient Medications  Medication Sig Dispense Refill  . Multiple Vitamins-Minerals (CENTRUM ULTRA WOMENS PO) Take by mouth.    . tamoxifen (NOLVADEX) 20 MG tablet TAKE 1 TABLET(20 MG) BY MOUTH DAILY 90 tablet 0  . vitamin C (ASCORBIC ACID) 250 MG tablet Take 250 mg by mouth daily.     No current facility-administered medications for this visit.    PHYSICAL EXAMINATION: ECOG PERFORMANCE STATUS: 1 - Symptomatic but completely ambulatory  Vitals:  10/20/19 1234  BP: 134/73  Pulse: 80  Resp: 17  Temp: 99.1 F (37.3 C)  SpO2: 100%   Filed Weights   10/20/19 1234  Weight: 116 lb 12.8 oz (53  kg)    BREAST: No palpable masses or nodules in either right or left breasts. No palpable axillary supraclavicular or infraclavicular adenopathy no breast tenderness or nipple discharge. (exam performed in the presence of a chaperone)  LABORATORY DATA:  I have reviewed the data as listed CMP Latest Ref Rng & Units 03/14/2019 04/08/2018 01/22/2017  Glucose 70 - 99 mg/dL 92 86 97  BUN 6 - 23 mg/dL _0 Creatinine 0.40 - 1.20 mg/dL 0.79 0.81 0.74  Sodium 135 - 145 mEq/L 138 135 138  Potassium 3.5 - 5.1 mEq/L 4.3 4.1 4.8  Chloride 96 - 112 mEq/L 105 103 105  CO2 19 - 32 mEq/L _1 Calcium 8.4 - 10.5 mg/dL 8.9 9.2 9.1  Total Protein 6.0 - 8.3 g/dL 6.4 6.7 6.5  Total Bilirubin 0.2 - 1.2 mg/dL 0.4 0.4 0.5  Alkaline Phos 39 - 117 U/L 39 40 41  AST 0 - 37 U/L _2 ALT 0 - 35 U/L _3 Lab Results  Component Value Date   WBC 6.1 03/14/2019   HGB 12.5 03/14/2019   HCT 38.2 03/14/2019   MCV 85.8 03/14/2019   PLT 282.0 03/14/2019   NEUTROABS 3.8 03/14/2019    ASSESSMENT & PLAN:  Malignant neoplasm of upper-inner quadrant of right breast in female, estrogen receptor positive ([place]) 08/13/2018:Palpable mass at 1230 to 1 o'clock position right breast with skin indentation and focal dimpling UOQ, ultrasound to masses. 12:30 position: 2 cm mass, at 1 o'clock position: 0.9 cm mass, together 3.4 cm. UOQ: 2 additional masses 0.7 cm and 0.7 cm, indeterminate retroareolar calcifications.  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 T1c N0 stage Ia clinical stage  09/16/2018:Bilateral mastectomies: Left mastectomy benign, right mastectomy: Invasive lobular cancer, multifocal, largest tumor 3.2 cm, grade 2, margins negative, 1/2 lymph nodes with isolated tumor cells, ER 90%, PR 70%, HER-2 -1+, Ki-67 10% T2 N0 I+ stage Ia  Recommendations: 1.Oncotype DX score: 17, risk of  distant recurrence 5% at 9 years: No chemo 2. Adjuvant antiestrogen therapywith tamoxifen since she is still premenopausal started 09/28/2018 --------------------------------------------------------------------------------------------------------------------------------- Tamoxifen toxicities: Denies any lumps or nodules Patient is following with Dr. Elana Alm 1 for history of uterine hypertrophy  Breast cancer surveillance: 1.  Chest wall examination 10/20/2019: No palpable lumps or nodules of concern 2.  No role of imaging studies because she had bilateral mastectomies.  Return to clinic in 1 year for follow-up    No orders of the defined types were placed in this encounter.  The patient has a good understanding of the overall plan. she agrees with it. she will call with any problems that may develop before the next visit here.  Total time spent: 20 mins including face to face time and time spent for planning, charting and coordination of care  Nicholas Lose, MD 10/20/2019  I, Cloyde Reams Dorshimer, am acting as scribe for Dr. Nicholas Lose.  I have reviewed the above documentation for accuracy and completeness, and I agree with the above.

## 2019-10-20 ENCOUNTER — Other Ambulatory Visit: Payer: Self-pay

## 2019-10-20 ENCOUNTER — Inpatient Hospital Stay: Payer: Managed Care, Other (non HMO) | Attending: Hematology and Oncology | Admitting: Hematology and Oncology

## 2019-10-20 ENCOUNTER — Encounter: Payer: Self-pay | Admitting: *Deleted

## 2019-10-20 DIAGNOSIS — Z79810 Long term (current) use of selective estrogen receptor modulators (SERMs): Secondary | ICD-10-CM | POA: Diagnosis not present

## 2019-10-20 DIAGNOSIS — Z170 Estrogen receptor positive status [ER+]: Secondary | ICD-10-CM | POA: Diagnosis not present

## 2019-10-20 DIAGNOSIS — C50211 Malignant neoplasm of upper-inner quadrant of right female breast: Secondary | ICD-10-CM | POA: Insufficient documentation

## 2019-10-20 MED ORDER — TAMOXIFEN CITRATE 20 MG PO TABS: 20.0000 mg | ORAL_TABLET | Freq: Every day | ORAL | 5 refills | Status: DC

## 2019-10-20 NOTE — Progress Notes (Signed)
Pt on Tamoxifen with Hx of Uterine Hypertrophy.  MD requesting this RN call pt GYN Dr. Ronita Hipps with [facility] and request Dr. Ronita Hipps to preform vaginal [place] during pt next yearly f/u to evaluate pts uterus.  RN spoke with Olivia Mackie, RN with Dr. Ronita Hipps and states she will relay the message to MD and order vaginal [place].

## 2019-10-20 NOTE — Assessment & Plan Note (Signed)
08/13/2018:Palpable mass at 1230 to 1 o'clock position right breast with skin indentation and focal dimpling UOQ, ultrasound to masses. 12:30 position: 2 cm mass, at 1 o'clock position: 0.9 cm mass, together 3.4 cm. UOQ: 2 additional masses 0.7 cm and 0.7 cm, indeterminate retroareolar calcifications.  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 T1c N0 stage Ia clinical stage  09/16/2018:Bilateral mastectomies: Left mastectomy benign, right mastectomy: Invasive lobular cancer, multifocal, largest tumor 3.2 cm, grade 2, margins negative, 1/2 lymph nodes with isolated tumor cells, ER 90%, PR 70%, HER-2 -1+, Ki-67 10% T2 N0 I+ stage Ia  Recommendations: 1.Oncotype DX score: 17, risk of distant recurrence 5% at 9 years: No chemo 2. Adjuvant antiestrogen therapywith tamoxifen since she is still premenopausal started 09/28/2018 --------------------------------------------------------------------------------------------------------------------------------- Tamoxifen toxicities: Denies any lumps or nodules Patient is following with Dr. Elana Alm 1 for history of uterine hypertrophy  Breast cancer surveillance: 1.  Chest wall examination 10/20/2019: No palpable lumps or nodules of concern 2.  No role of imaging studies because she had bilateral mastectomies.  Return to clinic in 1 year for follow-up

## 2019-10-25 ENCOUNTER — Other Ambulatory Visit: Payer: Self-pay | Admitting: Hematology and Oncology

## 2019-12-09 ENCOUNTER — Ambulatory Visit: Payer: Managed Care, Other (non HMO) | Attending: Internal Medicine

## 2019-12-09 DIAGNOSIS — Z23 Encounter for immunization: Secondary | ICD-10-CM

## 2019-12-09 NOTE — Progress Notes (Signed)
   Covid-19 Vaccination Clinic  Name:  Darlisa Spruiell    MRN: [id number] DOB: 10/23/1965  12/09/2019  Ms. Tryon was observed post Covid-19 immunization for 30 minutes based on pre-vaccination screening without incident. She was provided with Vaccine Information Sheet and instruction to access the V-Safe system.   Ms. Madril was instructed to call 911 with any severe reactions post vaccine: Marland Kitchen Difficulty breathing  . Swelling of face and throat  . A fast heartbeat  . A bad rash all over body  . Dizziness and weakness

## 2019-12-16 NOTE — Progress Notes (Signed)
Chief Complaint  Patient presents with  . Annual Exam    Doing okay    HPI: Patient  Megan Armstrong  54 y.o. comes in today for [place] visit   Headache 2 x per month . heaviness  And dull.  Advil.  Poss related to dehydration yesterday pulled back lbp lifting crate   No radiation  Had 3rd covid injection  In August  Had endometrial bx  No results yet Health Maintenance  Topic Date Due  . Hepatitis C Screening  Never done  . HIV Screening  Never done  . COLONOSCOPY  Never done  . PAP SMEAR-Modifier  03/10/2016  . INFLUENZA VACCINE  11/09/2019  . MAMMOGRAM  01/19/2020  . TETANUS/TDAP  02/05/2024  . COVID-19 Vaccine  Completed   Health Maintenance Review LIFESTYLE:  Exercise:  Walks  2 c per week  Tobacco/ETS:n Alcohol: n Sugar beverages:rare Sleep: 7 hours  Drug use: no HH of 3+ Work: 30 hours per week  Not at home    ROS:  GEN/ HEENT: No fever, significant weight changes sweatsvision problems hearing changes, CV/ PULM; No chest pain shortness of breath cough, syncope,edema  change in exercise tolerance. GI /GU: No adominal pain, vomiting, change in bowel habits. No blood in the stool. No significant GU symptoms. SKIN/HEME: ,no acute skin rashes suspicious lesions or bleeding. No lymphadenopathy, nodules, masses.  NEURO/ PSYCH:  No neurologic signs such as weakness numbness. No depression anxiety. IMM/ Allergy: No unusual infections.  Allergy .   REST of 12 system review negative except as per HPI   Past Medical History:  Diagnosis Date  . Allergic rhinitis   . Endometrial hyperplasia   . Endometriosis   . Family history of breast cancer   . Family history of breast cancer   . Hx of endometriosis    rectal vag with laser surgery   . Seasonal allergies   . 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 DIAGNOSTIC LAPAROSCOPY    . MASTECTOMY  W/ SENTINEL NODE BIOPSY Bilateral 09/16/2018   Procedure: RIGHT MASTECTOMY WITH RIGHT AXILLARY SENTINEL LYMPH NODE BIOPSY AND LEFT RISK REDUCING MASTECTOMY;  Surgeon: Rolm Bookbinder, MD;  Location: [facility];  Service: General;  Laterality: Bilateral;    Family History  Problem Relation Age of Onset  . Migraines Sister   . Asthma Other   . Diabetes Other   . Hypertension Other   . Sudden death Other   . Breast cancer Cousin 57       mat first cousin, BRCA pos    Social History   Socioeconomic History  . Marital status: Married    Spouse name: Not on file  . Number of children: Not on file  . Years of education: Not on file  . Highest education level: Not on file  Occupational History  . Not on file  Tobacco Use  . Smoking status: Never Smoker  . Smokeless tobacco: Never Used  Vaping Use  . Vaping Use: Never used  Substance and Sexual Activity  . Alcohol use: No  . Drug use: No  . Sexual activity: Not on file  Other Topics Concern  . Not on file  Social History Narrative   Borderline spirometry   Married   Regular exercise   decff tea, some coke   2 children at home hh of  4    Pharmacy school internship   Family form [place]   Social Determinants of Health   Financial Resource Strain:   . Difficulty of Paying Living Expenses: Not on file  Food Insecurity:   . Worried About Charity fundraiser in the Last Year: Not on file  . Ran Out of Food in the Last Year: Not on file  Transportation Needs:   . Lack of Transportation (Medical): Not on file  . Lack of Transportation (Non-Medical): Not on file  Physical Activity:   . Days of Exercise per Week: Not on file  . Minutes of Exercise per Session: Not on file  Stress:   . Feeling of Stress : Not on file  Social Connections:   . Frequency of Communication with Friends and Family: Not on file  . Frequency of Social Gatherings with Friends and Family: Not on file  . Attends Religious  Services: Not on file  . Active Member of Clubs or Organizations: Not on file  . Attends Archivist Meetings: Not on file  . Marital Status: Not on file    Outpatient Medications Prior to Visit  Medication Sig Dispense Refill  . Multiple Vitamins-Minerals (CENTRUM ULTRA WOMENS PO) Take by mouth.    . tamoxifen (NOLVADEX) 20 MG tablet TAKE 1 TABLET(20 MG) BY MOUTH DAILY 90 tablet 5  . vitamin C (ASCORBIC ACID) 250 MG tablet Take 250 mg by mouth daily.     No facility-administered medications prior to visit.     EXAM:  BP 126/64   Pulse 84   Temp 98.5 F (36.9 C) (Oral)   Ht 5' 2.5" (1.588 m)   Wt 114 lb 9.6 oz (52 kg)   SpO2 99%   BMI 20.63 kg/m   Body mass index is 20.63 kg/m. Wt Readings from Last 3 Encounters:  12/17/19 114 lb 9.6 oz (52 kg)  10/20/19 116 lb 12.8 oz (53 kg)  03/26/19 118 lb 9.6 oz (53.8 kg)    Physical Exam: Vital signs reviewed EGB:TDVV is a well-developed well-nourished alert cooperative    who appearsr stated age in no acute distress.   Mild discomfort arising  From laying ( back)  HEENT: normocephalic atraumatic , Eyes: PERRL EOM's full, conjunctiva clear, Nares: paten,t no deformity discharge or tenderness., Ears: no deformity EAC's clear TMs with normal landmarks. Mouth: masked  NECK: supple without masses, thyromegaly or bruits. CHEST/PULM:  Clear to auscultation and percussion breath sounds equal no wheeze , rales or rhonchi. No chest wall deformities or tenderness.  breast absent  . CV: PMI is nondisplaced, S1 S2 no gallops, murmurs, rubs. Peripheral pulses are full without delay.No JVD .  ABDOMEN: Bowel sounds normal nontender  No guard or rebound, no hepato splenomegal no CVA tenderness.   Extremtities:  No clubbing cyanosis or edema, no acute joint swelling or redness no focal atrophy NEURO:  Oriented x3, cranial nerves 3-12 appear to be intact, no obvious focal weakness,gait within normal limits no abnormal reflexes or  asymmetrical SKIN: No acute rashes normal turgor, color, no bruising or petechiae. PSYCH: Oriented, good eye contact, no obvious depression anxiety, cognition and judgment appear normal. LN: no cervical axillary inguinal adenopathy  Lab Results  Component Value Date   WBC 6.1 03/14/2019   HGB 12.5 03/14/2019   HCT 38.2 03/14/2019   PLT 282.0 03/14/2019   GLUCOSE 92 03/14/2019   CHOL 192 03/14/2019   TRIG 183.0 (H) 03/14/2019   HDL 40.10 03/14/2019  LDLCALC 115 (H) 03/14/2019   ALT 21 03/14/2019   AST 15 03/14/2019   NA 138 03/14/2019   K 4.3 03/14/2019   CL 105 03/14/2019   CREATININE 0.79 03/14/2019   BUN 14 03/14/2019   CO2 26 03/14/2019   TSH 1.65 03/14/2019   HGBA1C 5.5 03/14/2019    BP Readings from Last 3 Encounters:  12/17/19 126/64  10/20/19 134/73  03/26/19 128/76    Lab plan  reviewed with patient   ASSESSMENT AND PLAN:  Discussed the following assessment and plan:    ICD-10-CM   1. Visit for preventive health examination  Z00.00 CBC with Differential/Platelet    Hemoglobin A1c    Hepatic function panel    Lipid panel    TSH    BASIC METABOLIC PANEL WITH GFR  2. Hx gestational diabetes  Z86.32 CBC with Differential/Platelet    Hemoglobin A1c    Hepatic function panel    Lipid panel    TSH    BASIC METABOLIC PANEL WITH GFR  3. Low back strain, initial encounter  S39.012A CBC with Differential/Platelet    Hemoglobin A1c    Hepatic function panel    Lipid panel    TSH    BASIC METABOLIC PANEL WITH GFR  4. Malignant neoplasm of upper-inner quadrant of right breast in female, estrogen receptor positive ([facility])  C50.211 CBC with Differential/Platelet   Z17.0 Hemoglobin A1c    Hepatic function panel    Lipid panel    TSH    BASIC METABOLIC PANEL WITH GFR  5. Medication management  Z79.899 CBC with Differential/Platelet    Hemoglobin A1c    Hepatic function panel    Lipid panel    TSH    BASIC METABOLIC PANEL WITH GFR  6. Screening, lipid   Z13.220 CBC with Differential/Platelet    Hemoglobin A1c    Hepatic function panel    Lipid panel    TSH    BASIC METABOLIC PANEL WITH GFR  7. Need for hepatitis C screening test  Z11.59 Hepatitis C antibody  back hygiene ice   Fu if  persistent or progressive Follow headaches    Return for fasting lab  and yearly if all ok.  Patient Care Team: Rashada Klontz, Standley Brooking, MD as PCP - General Brien Few, MD as Consulting Physician (Obstetrics and Gynecology) Juanita Craver, MD as Consulting Physician (Gastroenterology) Nicholas Lose, MD as Consulting Physician (Hematology and Oncology) Irene Limbo, MD as Consulting Physician (Plastic Surgery) Rolm Bookbinder, MD as Consulting Physician (General Surgery) Patient Instructions  Get flu vaccine  End of Septtember ok  Fasting labs   Back hygiene until heals.ics after activity . Stay  Hydrated .    Health Maintenance, Female Adopting a healthy lifestyle and getting preventive care are important in promoting health and wellness. Ask your health care provider about:  The right schedule for you to have regular tests and exams.  Things you can do on your own to prevent diseases and keep yourself healthy. What should I know about diet, weight, and exercise? Eat a healthy diet   Eat a diet that includes plenty of vegetables, fruits, low-fat dairy products, and lean protein.  Do not eat a lot of foods that are high in solid fats, added sugars, or sodium. Maintain a healthy weight Body mass index (BMI) is used to identify weight problems. It estimates body fat based on height and weight. Your health care provider can help determine your BMI and help you achieve or maintain  a healthy weight. Get regular exercise Get regular exercise. This is one of the most important things you can do for your health. Most adults should:  Exercise for at least 150 minutes each week. The exercise should increase your heart rate and make you sweat  (moderate-intensity exercise).  Do strengthening exercises at least twice a week. This is in addition to the moderate-intensity exercise.  Spend less time sitting. Even light physical activity can be beneficial. Watch cholesterol and blood lipids Have your blood tested for lipids and cholesterol at 54 years of age, then have this test every 5 years. Have your cholesterol levels checked more often if:  Your lipid or cholesterol levels are high.  You are older than 54 years of age.  You are at high risk for heart disease. What should I know about cancer screening? Depending on your health history and family history, you may need to have cancer screening at various ages. This may include screening for:  Breast cancer.  Cervical cancer.  Colorectal cancer.  Skin cancer.  Lung cancer. What should I know about heart disease, diabetes, and high blood pressure? Blood pressure and heart disease  High blood pressure causes heart disease and increases the risk of stroke. This is more likely to develop in people who have high blood pressure readings, are of African descent, or are overweight.  Have your blood pressure checked: ? Every 3-5 years if you are 77-48 years of age. ? Every year if you are 38 years old or older. Diabetes Have regular diabetes screenings. This checks your fasting blood sugar level. Have the screening done:  Once every three years after age 27 if you are at a normal weight and have a low risk for diabetes.  More often and at a younger age if you are overweight or have a high risk for diabetes. What should I know about preventing infection? Hepatitis B If you have a higher risk for hepatitis B, you should be screened for this virus. Talk with your health care provider to find out if you are at risk for hepatitis B infection. Hepatitis C Testing is recommended for:  Everyone born from [birth year] through 1965.  Anyone with known risk factors for hepatitis  C. Sexually transmitted infections (STIs)  Get screened for STIs, including gonorrhea and chlamydia, if: ? You are sexually active and are younger than 54 years of age. ? You are older than 54 years of age and your health care provider tells you that you are at risk for this type of infection. ? Your sexual activity has changed since you were last screened, and you are at increased risk for chlamydia or gonorrhea. Ask your health care provider if you are at risk.  Ask your health care provider about whether you are at high risk for HIV. Your health care provider may recommend a prescription medicine to help prevent HIV infection. If you choose to take medicine to prevent HIV, you should first get tested for HIV. You should then be tested every 3 months for as long as you are taking the medicine. Pregnancy  If you are about to stop having your period (premenopausal) and you may become pregnant, seek counseling before you get pregnant.  Take 400 to 800 micrograms (mcg) of folic acid every day if you become pregnant.  Ask for birth control (contraception) if you want to prevent pregnancy. Osteoporosis and menopause Osteoporosis is a disease in which the bones lose minerals and strength with aging. This can  result in bone fractures. If you are 21 years old or older, or if you are at risk for osteoporosis and fractures, ask your health care provider if you should:  Be screened for bone loss.  Take a calcium or vitamin D supplement to lower your risk of fractures.  Be given hormone replacement therapy (HRT) to treat symptoms of menopause. Follow these instructions at home: Lifestyle  Do not use any products that contain nicotine or tobacco, such as cigarettes, e-cigarettes, and chewing tobacco. If you need help quitting, ask your health care provider.  Do not use street drugs.  Do not share needles.  Ask your health care provider for help if you need support or information about quitting  drugs. Alcohol use  Do not drink alcohol if: ? Your health care provider tells you not to drink. ? You are pregnant, may be pregnant, or are planning to become pregnant.  If you drink alcohol: ? Limit how much you use to 0-1 drink a day. ? Limit intake if you are breastfeeding.  Be aware of how much alcohol is in your drink. In the [place] one drink equals one 12 oz bottle of beer (355 mL), one 5 oz glass of wine (148 mL), or one 1 oz glass of hard liquor (44 mL). General instructions  Schedule regular health, dental, and eye exams.  Stay current with your vaccines.  Tell your health care provider if: ? You often feel depressed. ? You have ever been abused or do not feel safe at home. Summary  Adopting a healthy lifestyle and getting preventive care are important in promoting health and wellness.  Follow your health care provider's instructions about healthy diet, exercising, and getting tested or screened for diseases.  Follow your health care provider's instructions on monitoring your cholesterol and blood pressure. This information is not intended to replace advice given to you by your health care provider. Make sure you discuss any questions you have with your health care provider. Document Revised: 03/20/2018 Document Reviewed: 03/20/2018 Elsevier Patient Education  2020 [place] Katlin Ciszewski M.D.

## 2019-12-17 ENCOUNTER — Ambulatory Visit (INDEPENDENT_AMBULATORY_CARE_PROVIDER_SITE_OTHER): Payer: Managed Care, Other (non HMO) | Admitting: Internal Medicine

## 2019-12-17 ENCOUNTER — Encounter: Payer: Self-pay | Admitting: Internal Medicine

## 2019-12-17 ENCOUNTER — Other Ambulatory Visit: Payer: Self-pay

## 2019-12-17 VITALS — BP 126/64 | HR 84 | Temp 98.5°F | Ht 62.5 in | Wt 114.6 lb

## 2019-12-17 DIAGNOSIS — Z0000 Encounter for general adult medical examination without abnormal findings: Secondary | ICD-10-CM

## 2019-12-17 DIAGNOSIS — Z170 Estrogen receptor positive status [ER+]: Secondary | ICD-10-CM

## 2019-12-17 DIAGNOSIS — C50211 Malignant neoplasm of upper-inner quadrant of right female breast: Secondary | ICD-10-CM

## 2019-12-17 DIAGNOSIS — Z8632 Personal history of gestational diabetes: Secondary | ICD-10-CM

## 2019-12-17 DIAGNOSIS — Z79899 Other long term (current) drug therapy: Secondary | ICD-10-CM

## 2019-12-17 DIAGNOSIS — S39012A Strain of muscle, fascia and tendon of lower back, initial encounter: Secondary | ICD-10-CM

## 2019-12-17 DIAGNOSIS — Z13220 Encounter for screening for lipoid disorders: Secondary | ICD-10-CM

## 2019-12-17 DIAGNOSIS — Z1159 Encounter for screening for other viral diseases: Secondary | ICD-10-CM

## 2019-12-17 NOTE — Patient Instructions (Signed)
Get flu vaccine  End of Septtember ok  Fasting labs   Back hygiene until heals.ics after activity . Stay  Hydrated .    Health Maintenance, Female Adopting a healthy lifestyle and getting preventive care are important in promoting health and wellness. Ask your health care provider about:  The right schedule for you to have regular tests and exams.  Things you can do on your own to prevent diseases and keep yourself healthy. What should I know about diet, weight, and exercise? Eat a healthy diet   Eat a diet that includes plenty of vegetables, fruits, low-fat dairy products, and lean protein.  Do not eat a lot of foods that are high in solid fats, added sugars, or sodium. Maintain a healthy weight Body mass index (BMI) is used to identify weight problems. It estimates body fat based on height and weight. Your health care provider can help determine your BMI and help you achieve or maintain a healthy weight. Get regular exercise Get regular exercise. This is one of the most important things you can do for your health. Most adults should:  Exercise for at least 150 minutes each week. The exercise should increase your heart rate and make you sweat (moderate-intensity exercise).  Do strengthening exercises at least twice a week. This is in addition to the moderate-intensity exercise.  Spend less time sitting. Even light physical activity can be beneficial. Watch cholesterol and blood lipids Have your blood tested for lipids and cholesterol at 54 years of age, then have this test every 5 years. Have your cholesterol levels checked more often if:  Your lipid or cholesterol levels are high.  You are older than 54 years of age.  You are at high risk for heart disease. What should I know about cancer screening? Depending on your health history and family history, you may need to have cancer screening at various ages. This may include screening for:  Breast cancer.  Cervical  cancer.  Colorectal cancer.  Skin cancer.  Lung cancer. What should I know about heart disease, diabetes, and high blood pressure? Blood pressure and heart disease  High blood pressure causes heart disease and increases the risk of stroke. This is more likely to develop in people who have high blood pressure readings, are of African descent, or are overweight.  Have your blood pressure checked: ? Every 3-5 years if you are 24-60 years of age. ? Every year if you are 93 years old or older. Diabetes Have regular diabetes screenings. This checks your fasting blood sugar level. Have the screening done:  Once every three years after age 40 if you are at a normal weight and have a low risk for diabetes.  More often and at a younger age if you are overweight or have a high risk for diabetes. What should I know about preventing infection? Hepatitis B If you have a higher risk for hepatitis B, you should be screened for this virus. Talk with your health care provider to find out if you are at risk for hepatitis B infection. Hepatitis C Testing is recommended for:  Everyone born from [birth year] through 1965.  Anyone with known risk factors for hepatitis C. Sexually transmitted infections (STIs)  Get screened for STIs, including gonorrhea and chlamydia, if: ? You are sexually active and are younger than 54 years of age. ? You are older than 54 years of age and your health care provider tells you that you are at risk for this type of infection. ?  Your sexual activity has changed since you were last screened, and you are at increased risk for chlamydia or gonorrhea. Ask your health care provider if you are at risk.  Ask your health care provider about whether you are at high risk for HIV. Your health care provider may recommend a prescription medicine to help prevent HIV infection. If you choose to take medicine to prevent HIV, you should first get tested for HIV. You should then be tested every 3  months for as long as you are taking the medicine. Pregnancy  If you are about to stop having your period (premenopausal) and you may become pregnant, seek counseling before you get pregnant.  Take 400 to 800 micrograms (mcg) of folic acid every day if you become pregnant.  Ask for birth control (contraception) if you want to prevent pregnancy. Osteoporosis and menopause Osteoporosis is a disease in which the bones lose minerals and strength with aging. This can result in bone fractures. If you are 30 years old or older, or if you are at risk for osteoporosis and fractures, ask your health care provider if you should:  Be screened for bone loss.  Take a calcium or vitamin D supplement to lower your risk of fractures.  Be given hormone replacement therapy (HRT) to treat symptoms of menopause. Follow these instructions at home: Lifestyle  Do not use any products that contain nicotine or tobacco, such as cigarettes, e-cigarettes, and chewing tobacco. If you need help quitting, ask your health care provider.  Do not use street drugs.  Do not share needles.  Ask your health care provider for help if you need support or information about quitting drugs. Alcohol use  Do not drink alcohol if: ? Your health care provider tells you not to drink. ? You are pregnant, may be pregnant, or are planning to become pregnant.  If you drink alcohol: ? Limit how much you use to 0-1 drink a day. ? Limit intake if you are breastfeeding.  Be aware of how much alcohol is in your drink. In the [place] one drink equals one 12 oz bottle of beer (355 mL), one 5 oz glass of wine (148 mL), or one 1 oz glass of hard liquor (44 mL). General instructions  Schedule regular health, dental, and eye exams.  Stay current with your vaccines.  Tell your health care provider if: ? You often feel depressed. ? You have ever been abused or do not feel safe at home. Summary  Adopting a healthy lifestyle and getting  preventive care are important in promoting health and wellness.  Follow your health care provider's instructions about healthy diet, exercising, and getting tested or screened for diseases.  Follow your health care provider's instructions on monitoring your cholesterol and blood pressure. This information is not intended to replace advice given to you by your health care provider. Make sure you discuss any questions you have with your health care provider. Document Revised: 03/20/2018 Document Reviewed: 03/20/2018 Elsevier Patient Education  2020 [employer].

## 2020-01-08 ENCOUNTER — Other Ambulatory Visit: Payer: Managed Care, Other (non HMO)

## 2020-01-08 ENCOUNTER — Other Ambulatory Visit: Payer: Self-pay

## 2020-01-08 DIAGNOSIS — Z1159 Encounter for screening for other viral diseases: Secondary | ICD-10-CM

## 2020-01-08 DIAGNOSIS — C50211 Malignant neoplasm of upper-inner quadrant of right female breast: Secondary | ICD-10-CM

## 2020-01-08 DIAGNOSIS — Z0000 Encounter for general adult medical examination without abnormal findings: Secondary | ICD-10-CM

## 2020-01-08 DIAGNOSIS — Z79899 Other long term (current) drug therapy: Secondary | ICD-10-CM

## 2020-01-08 DIAGNOSIS — S39012A Strain of muscle, fascia and tendon of lower back, initial encounter: Secondary | ICD-10-CM

## 2020-01-08 DIAGNOSIS — Z8632 Personal history of gestational diabetes: Secondary | ICD-10-CM

## 2020-01-08 DIAGNOSIS — Z13220 Encounter for screening for lipoid disorders: Secondary | ICD-10-CM

## 2020-01-09 ENCOUNTER — Encounter: Payer: Self-pay | Admitting: Internal Medicine

## 2020-01-09 ENCOUNTER — Ambulatory Visit (INDEPENDENT_AMBULATORY_CARE_PROVIDER_SITE_OTHER): Payer: Managed Care, Other (non HMO) | Admitting: *Deleted

## 2020-01-09 DIAGNOSIS — Z23 Encounter for immunization: Secondary | ICD-10-CM

## 2020-01-11 NOTE — Progress Notes (Signed)
Results in normal range  except slightly low  hdl but all better than in past  No diabetes   blood count is normal .

## 2020-01-12 LAB — CBC WITH DIFFERENTIAL/PLATELET
Absolute Monocytes: 371 cells/uL (ref 200–950)
Basophils Absolute: 42 cells/uL (ref 0–200)
Basophils Relative: 0.8 %
Eosinophils Absolute: 42 cells/uL (ref 15–500)
Eosinophils Relative: 0.8 %
HCT: 38.9 % (ref 35.0–45.0)
Hemoglobin: 12.7 g/dL (ref 11.7–15.5)
Lymphs Abs: 1638 cells/uL (ref 850–3900)
MCH: 28.4 pg (ref 27.0–33.0)
MCHC: 32.6 g/dL (ref 32.0–36.0)
MCV: 87 fL (ref 80.0–100.0)
MPV: 12 fL (ref 7.5–12.5)
Monocytes Relative: 7 %
Neutro Abs: 3207 cells/uL (ref 1500–7800)
Neutrophils Relative %: 60.5 %
Platelets: 237 10*3/uL (ref 140–400)
RBC: 4.47 10*6/uL (ref 3.80–5.10)
RDW: 13.3 % (ref 11.0–15.0)
Total Lymphocyte: 30.9 %
WBC: 5.3 10*3/uL (ref 3.8–10.8)

## 2020-01-12 LAB — HEPATIC FUNCTION PANEL
AG Ratio: 1.4 (calc) (ref 1.0–2.5)
ALT: 16 U/L (ref 6–29)
AST: 16 U/L (ref 10–35)
Albumin: 3.8 g/dL (ref 3.6–5.1)
Alkaline phosphatase (APISO): 43 U/L (ref 37–153)
Bilirubin, Direct: 0.1 mg/dL (ref 0.0–0.2)
Globulin: 2.7 g/dL (calc) (ref 1.9–3.7)
Indirect Bilirubin: 0.2 mg/dL (calc) (ref 0.2–1.2)
Total Bilirubin: 0.3 mg/dL (ref 0.2–1.2)
Total Protein: 6.5 g/dL (ref 6.1–8.1)

## 2020-01-12 LAB — HEMOGLOBIN A1C
Hgb A1c MFr Bld: 5.2 % of total Hgb (ref <5.7)
Mean Plasma Glucose: 103 (calc)
eAG (mmol/L): 5.7 (calc)

## 2020-01-12 LAB — HEPATITIS C ANTIBODY
Hepatitis C Ab: NONREACTIVE
SIGNAL TO CUT-OFF: 0.01 (ref <1.00)

## 2020-01-12 LAB — LIPID PANEL
Cholesterol: 158 mg/dL (ref <200)
HDL: 45 mg/dL — ABNORMAL LOW (ref >=50)
LDL Cholesterol (Calc): 90 mg/dL (calc)
Non-HDL Cholesterol (Calc): 113 mg/dL (calc) (ref <130)
Total CHOL/HDL Ratio: 3.5 (calc) (ref <5.0)
Triglycerides: 134 mg/dL (ref <150)

## 2020-01-12 LAB — BASIC METABOLIC PANEL WITH GFR
BUN: 19 mg/dL (ref 7–25)
CO2: 22 mmol/L (ref 20–32)
Calcium: 8.9 mg/dL (ref 8.6–10.4)
Chloride: 107 mmol/L (ref 98–110)
Creat: 0.82 mg/dL (ref 0.50–1.05)
GFR, Est African American: 94 mL/min/{1.73_m2} (ref >=60)
GFR, Est Non African American: 81 mL/min/{1.73_m2} (ref >=60)
Glucose, Bld: 90 mg/dL (ref 65–99)
Potassium: 4.5 mmol/L (ref 3.5–5.3)
Sodium: 139 mmol/L (ref 135–146)

## 2020-01-12 LAB — TSH: TSH: 1.21 mIU/L

## 2020-02-25 ENCOUNTER — Telehealth: Payer: Self-pay | Admitting: Internal Medicine

## 2020-02-25 NOTE — Telephone Encounter (Signed)
Pt came in dropped off a Wellness Screening Form to be completed by the provider.  Upon completion pt would like to be called [phone] to pick the form up.  Form placed in providers folder for completion.

## 2020-02-27 NOTE — Telephone Encounter (Signed)
Form completed and signed  On desk  No charge.

## 2020-03-01 NOTE — Telephone Encounter (Signed)
Wellness screening form completed by MD.  Megan Armstrong patient will pick up at front desk.   Copy given to MR for scan

## 2020-03-24 NOTE — Progress Notes (Signed)
Patient Care Team: Panosh, Standley Brooking, MD as PCP - General Brien Few, MD as Consulting Physician (Obstetrics and Gynecology) Juanita Craver, MD as Consulting Physician (Gastroenterology) Nicholas Lose, MD as Consulting Physician (Hematology and Oncology) Irene Limbo, MD as Consulting Physician (Plastic Surgery) Rolm Bookbinder, MD as Consulting Physician (General Surgery)  DIAGNOSIS:    ICD-10-CM   1. Malignant neoplasm of upper-inner quadrant of right breast in female, estrogen receptor positive ([place])  C50.211    Z17.0     SUMMARY OF ONCOLOGIC HISTORY: Oncology History  Malignant neoplasm of upper-inner quadrant of right breast in female, estrogen receptor positive ([place])  08/13/2018 Initial Diagnosis   Palpable mass at 1230 to 1 o'clock position right breast with skin indentation and focal dimpling UOQ, ultrasound to masses.  12:30 position: 2 cm mass, at 1 o'clock position: 0.9 cm mass, together 3.4 cm.  UOQ: 2 additional masses 0.7 cm and 0.7 cm, indeterminate retroareolar calcifications.   08/13/2018 Pathology Results   Right breast needle core biopsy 1230: Grade 2 ILC with LCIS, UOQ 10:30 position: Grade 2 ILC with LCIS, ER 90%, PR 30%, Ki-67 10%, HER-2 -1+ by IHC.  T1c N0 stage Ia clinical stage   09/06/2018 Genetic Testing   Negative genetic testing on the CustomNext-Cancer+RNAinsight testing. 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  The CustomNext-Expanded gene panel offered by [facility] and includes sequencing and rearrangement analysis for the following 81 genes: AIP, ALK, APC*, ATM*, AXIN2, BAP1, BARD1, BLM, BMPR1A, BRCA1*, BRCA2*, BRIP1*, CDC73, CDH1*, CDK4, CDKN1B, CDKN2A, CHEK2*, CTNNA1, DICER1, FANCC, FH, FLCN, GALNT12, HOXB13, KIT, MAX, MEN1, MET, MLH1*, MRE11A, MSH2*, MSH6*, MUTYH*, NBN, NF1*, NF2, NTHL1, PALB2*, PDGFRA, PHOX2B, PMS2*, POLD1, POLE, POT1, PRKAR1A, PTCH1, PTEN*, RAD50, RAD51C*, RAD51D*, RB1, RET, SDHA, SDHAF2, SDHB, SDHC, SDHD, SMAD4, SMARCA4,  SMARCB1, SMARCE1, STK11, SUFU, TMEM127, TP53*, TSC1, TSC2, VHL and XRCC2 (sequencing and deletion/duplication); CASR, CFTR, CPA1, CTRC, EGFR, MITF, PRSS1 and SPINK1 (sequencing only); EPCAM and GREM1 (deletion/duplication only). DNA and RNA analyses performed for * genes.  The report date is Sep 06, 2018.   09/16/2018 Surgery   Bilateral mastectomies: Left mastectomy benign, right mastectomy: Invasive lobular cancer, multifocal, largest tumor 3.2 cm, grade 2, margins negative, 1/2 lymph nodes with isolated tumor cells, ER 90%, PR 70%, HER-2 -1+, Ki-67 10% T2 N0 I+ stage Ia   09/16/2018 Oncotype testing   17/5%   09/20/2018 Cancer Staging   Staging form: Breast, AJCC 8th Edition - Pathologic stage from 09/20/2018: Stage IA (pT2, pN0(i+)(sn), cM0, G2, ER+, PR+, HER2-) - Signed by Nicholas Lose, MD on 09/20/2018   09/2018 -  Anti-estrogen oral therapy   Tamoxifen daily     CHIEF COMPLIANT: Follow-up of right breast cancer on tamoxifen  INTERVAL HISTORY: [name] is a 54 y.o. with above-mentioned history of right breast cancer who underwent bilateral mastectomies and is currently on anti-estrogen therapy with tamoxifen.She presents to the clinic today for follow-up.   She reports no major problems or concerns with breast.  She is tolerating tamoxifen reasonably well.  She has occasional hot flashes.  She has had heavier menstrual cycles.  She gets monitored by her gynecologist Dr. Ronita Hipps.  ALLERGIES:  is allergic to codeine, penicillins, clindamycin, clindamycin/lincomycin, keflex [cephalexin], and penicillin g.  MEDICATIONS:  Current Outpatient Medications  Medication Sig Dispense Refill  . Multiple Vitamins-Minerals (CENTRUM ULTRA WOMENS PO) Take by mouth.    . tamoxifen (NOLVADEX) 20 MG tablet TAKE 1 TABLET(20 MG) BY MOUTH DAILY 90 tablet 5  . vitamin  C (ASCORBIC ACID) 250 MG tablet Take 250 mg by mouth daily.     No current facility-administered medications for this visit.     PHYSICAL EXAMINATION: ECOG PERFORMANCE STATUS: 1 - Symptomatic but completely ambulatory  Vitals:   03/25/20 0925  BP: 108/61  Pulse: 72  Resp: 18  Temp: 98.1 F (36.7 C)  SpO2: 100%   Filed Weights   03/25/20 0925  Weight: 115 lb 6.4 oz (52.3 kg)    BREAST: No palpable masses or nodules in either right or left chest wall or axilla scars from bilateral mastectomies are without any major concerns the medial aspect of the right chest wall scar has a slight nodularity which is related to scar tissue/keloid and is unchanged from last year.. . (exam performed in the presence of a chaperone)  LABORATORY DATA:  I have reviewed the data as listed CMP Latest Ref Rng & Units 01/08/2020 03/14/2019 04/08/2018  Glucose 65 - 99 mg/dL 90 92 86  BUN 7 - 25 mg/dL _0 Creatinine 0.50 - 1.05 mg/dL 0.82 0.79 0.81  Sodium 135 - 146 mmol/L 139 138 135  Potassium 3.5 - 5.3 mmol/L 4.5 4.3 4.1  Chloride 98 - 110 mmol/L 107 105 103  CO2 20 - 32 mmol/L _1 Calcium 8.6 - 10.4 mg/dL 8.9 8.9 9.2  Total Protein 6.1 - 8.1 g/dL 6.5 6.4 6.7  Total Bilirubin 0.2 - 1.2 mg/dL 0.3 0.4 0.4  Alkaline Phos 39 - 117 U/L - 39 40  AST 10 - 35 U/L _2 ALT 6 - 29 U/L _3 Lab Results  Component Value Date   WBC 5.3 01/08/2020   HGB 12.7 01/08/2020   HCT 38.9 01/08/2020   MCV 87.0 01/08/2020   PLT 237 01/08/2020   NEUTROABS 3,207 01/08/2020    ASSESSMENT & PLAN:  Malignant neoplasm of upper-inner quadrant of right breast in female, estrogen receptor positive ([place]) 08/13/2018:Palpable mass at 1230 to 1 o'clock position right breast with skin indentation and focal dimpling UOQ, ultrasound to masses. 12:30 position: 2 cm mass, at 1 o'clock position: 0.9 cm mass, together 3.4 cm. UOQ: 2 additional masses 0.7 cm and 0.7 cm, indeterminate retroareolar calcifications.  Right breast needle core biopsy 1230: Grade 2 ILC with LCIS, UOQ 10:30 position: Grade 2 ILC with LCIS, ER 90%, PR 30%,  Ki-67 10%, HER-2 -1+ by IHC. T1c N0 stage Ia clinical stage  09/16/2018:Bilateral mastectomies: Left mastectomy benign, right mastectomy: Invasive lobular cancer, multifocal, largest tumor 3.2 cm, grade 2, margins negative, 1/2 lymph nodes with isolated tumor cells, ER 90%, PR 70%, HER-2 -1+, Ki-67 10% T2 N0 I+ stage Ia  Recommendations: 1.Oncotype DXscore: 17, risk of distant recurrence 5% at 9 years: No chemo 2. Adjuvant antiestrogen therapywith tamoxifen since she is still premenopausalstarted 09/28/2018 --------------------------------------------------------------------------------------------------------------------------------- Tamoxifen toxicities: Occasional hot flashes and muscle cramps. Dr.Taavon is monitoring her uterine hypertrophy.  Breast cancer surveillance: 1.Chest wall examination 03/25/2020: No palpable lumps or nodules of concern 2.No role of imaging studies because she had bilateral mastectomies.  Return to clinic in 1 year for follow-up    No orders of the defined types were placed in this encounter.  The patient has a good understanding of the overall plan. she agrees with it. she will call with any problems that may develop before the next visit here.  Total time spent: 20 mins including face to face time and time spent for planning, charting and coordination  of care  Nicholas Lose, MD 03/25/2020  Julious Oka Dorshimer, am acting as scribe for Dr. Nicholas Lose.  I have reviewed the above documentation for accuracy and completeness, and I agree with the above.

## 2020-03-24 NOTE — Assessment & Plan Note (Signed)
08/13/2018:Palpable mass at 1230 to 1 o'clock position right breast with skin indentation and focal dimpling UOQ, ultrasound to masses. 12:30 position: 2 cm mass, at 1 o'clock position: 0.9 cm mass, together 3.4 cm. UOQ: 2 additional masses 0.7 cm and 0.7 cm, indeterminate retroareolar calcifications.  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 T1c N0 stage Ia clinical stage  09/16/2018:Bilateral mastectomies: Left mastectomy benign, right mastectomy: Invasive lobular cancer, multifocal, largest tumor 3.2 cm, grade 2, margins negative, 1/2 lymph nodes with isolated tumor cells, ER 90%, PR 70%, HER-2 -1+, Ki-67 10% T2 N0 I+ stage Ia  Recommendations: 1.Oncotype DXscore: 17, risk of distant recurrence 5% at 9 years: No chemo 2. Adjuvant antiestrogen therapywith tamoxifen since she is still premenopausalstarted 09/28/2018 --------------------------------------------------------------------------------------------------------------------------------- Tamoxifen toxicities:Denies any lumps or nodules  Breast cancer surveillance: 1.Chest wall examination 03/25/2020: No palpable lumps or nodules of concern 2.No role of imaging studies because she had bilateral mastectomies.  Return to clinic in 1 year for follow-up

## 2020-03-25 ENCOUNTER — Inpatient Hospital Stay: Payer: Managed Care, Other (non HMO) | Attending: Hematology and Oncology | Admitting: Hematology and Oncology

## 2020-03-25 ENCOUNTER — Other Ambulatory Visit: Payer: Self-pay

## 2020-03-25 DIAGNOSIS — Z79899 Other long term (current) drug therapy: Secondary | ICD-10-CM | POA: Insufficient documentation

## 2020-03-25 DIAGNOSIS — C50211 Malignant neoplasm of upper-inner quadrant of right female breast: Secondary | ICD-10-CM | POA: Diagnosis not present

## 2020-03-25 DIAGNOSIS — Z79810 Long term (current) use of selective estrogen receptor modulators (SERMs): Secondary | ICD-10-CM | POA: Diagnosis not present

## 2020-03-25 DIAGNOSIS — Z9013 Acquired absence of bilateral breasts and nipples: Secondary | ICD-10-CM | POA: Diagnosis not present

## 2020-03-25 DIAGNOSIS — R232 Flushing: Secondary | ICD-10-CM | POA: Diagnosis not present

## 2020-03-25 DIAGNOSIS — Z170 Estrogen receptor positive status [ER+]: Secondary | ICD-10-CM | POA: Insufficient documentation

## 2020-03-25 MED ORDER — TAMOXIFEN CITRATE 20 MG PO TABS
20.0000 mg | ORAL_TABLET | Freq: Every day | ORAL | 3 refills | Status: DC
Start: 2020-03-25 — End: 2021-01-17

## 2020-03-30 ENCOUNTER — Telehealth: Payer: Self-pay | Admitting: Hematology and Oncology

## 2020-03-30 NOTE — Telephone Encounter (Signed)
No 12/16 los, no changes made to pt schedule

## 2020-09-17 ENCOUNTER — Telehealth: Payer: Self-pay | Admitting: Internal Medicine

## 2020-09-17 NOTE — Telephone Encounter (Signed)
Patient dropped off forms that she would like Dr. Regis Bill to complete.  Patient would like to have forms back before 09/22/20 because they will be traveling.   She would like a call at [phone] when forms are complete.  Forms will be placed in folder.  Please advise.

## 2020-09-17 NOTE — Telephone Encounter (Signed)
Forms have been placed in red folder for review

## 2020-09-21 ENCOUNTER — Telehealth: Payer: Self-pay | Admitting: Hematology and Oncology

## 2020-09-21 NOTE — Telephone Encounter (Signed)
Scheduled appointment per 06/14 sch msg. Patient is aware. 

## 2020-09-22 NOTE — Telephone Encounter (Signed)
Give information needed write  leter required.  Mmy name

## 2020-09-23 NOTE — Telephone Encounter (Signed)
Form have been completed and placed in front office for pickup at the pt's convenience. Left  voice message for the pt to return my call in regards to pickup of forms

## 2020-12-24 ENCOUNTER — Other Ambulatory Visit: Payer: Self-pay

## 2020-12-27 ENCOUNTER — Telehealth: Payer: Self-pay | Admitting: Internal Medicine

## 2020-12-27 ENCOUNTER — Other Ambulatory Visit: Payer: Self-pay

## 2020-12-27 ENCOUNTER — Ambulatory Visit (INDEPENDENT_AMBULATORY_CARE_PROVIDER_SITE_OTHER): Payer: Managed Care, Other (non HMO)

## 2020-12-27 DIAGNOSIS — Z23 Encounter for immunization: Secondary | ICD-10-CM

## 2020-12-27 NOTE — Telephone Encounter (Signed)
Pt informed of the message and verbalized understanding. Pt stated that she cut herself on a trash can while emptying it., x2 days. Pt reported that the cut is already healing and was just wanting a TD shot in case she was due

## 2020-12-27 NOTE — Telephone Encounter (Signed)
Patient came in today for her flu shot and requested a call back for when her last tetanus shot was administered.   She says that she may need one soon due to a cut over the weekend and would like a call back to confirm whether she needs one and to schedule the appointment if she does in fact need a shot.  Contact number is [phone].  Please advise.

## 2020-12-27 NOTE — Telephone Encounter (Signed)
Left a message for the pt to return my call.  

## 2020-12-27 NOTE — Telephone Encounter (Signed)
Since last was 2015 should be okay u if it was a clean cut such as kitchen knife etc.  However t she may come in for updating Td since its been 7 .  Please have her describe her cut and situation she can send in a picture.

## 2020-12-28 ENCOUNTER — Ambulatory Visit (INDEPENDENT_AMBULATORY_CARE_PROVIDER_SITE_OTHER): Payer: Managed Care, Other (non HMO)

## 2020-12-28 DIAGNOSIS — Z23 Encounter for immunization: Secondary | ICD-10-CM | POA: Diagnosis not present

## 2020-12-28 NOTE — Telephone Encounter (Signed)
Pt added to [place] schedule for 09/20

## 2021-01-17 ENCOUNTER — Other Ambulatory Visit: Payer: Self-pay | Admitting: Hematology and Oncology

## 2021-03-22 ENCOUNTER — Encounter: Payer: Managed Care, Other (non HMO) | Admitting: Internal Medicine

## 2021-03-22 NOTE — Progress Notes (Signed)
Patient Care Team: Panosh, Standley Brooking, MD as PCP - General Brien Few, MD as Consulting Physician (Obstetrics and Gynecology) Juanita Craver, MD as Consulting Physician (Gastroenterology) Nicholas Lose, MD as Consulting Physician (Hematology and Oncology) Irene Limbo, MD as Consulting Physician (Plastic Surgery) Rolm Bookbinder, MD as Consulting Physician (General Surgery)  DIAGNOSIS:    ICD-10-CM   1. Malignant neoplasm of upper-inner quadrant of right breast in female, estrogen receptor positive ([place])  C50.211    Z17.0       SUMMARY OF ONCOLOGIC HISTORY: Oncology History  Malignant neoplasm of upper-inner quadrant of right breast in female, estrogen receptor positive ([place])  08/13/2018 Initial Diagnosis   Palpable mass at 1230 to 1 o'clock position right breast with skin indentation and focal dimpling UOQ, ultrasound to masses.  12:30 position: 2 cm mass, at 1 o'clock position: 0.9 cm mass, together 3.4 cm.  UOQ: 2 additional masses 0.7 cm and 0.7 cm, indeterminate retroareolar calcifications.   08/13/2018 Pathology Results   Right breast needle core biopsy 1230: Grade 2 ILC with LCIS, UOQ 10:30 position: Grade 2 ILC with LCIS, ER 90%, PR 30%, Ki-67 10%, HER-2 -1+ by IHC.  T1c N0 stage Ia clinical stage   09/06/2018 Genetic Testing   Negative genetic testing on the CustomNext-Cancer+RNAinsight testing. 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  The CustomNext-Expanded gene panel offered by [facility] and includes sequencing and rearrangement analysis for the following 81 genes: AIP, ALK, APC*, ATM*, AXIN2, BAP1, BARD1, BLM, BMPR1A, BRCA1*, BRCA2*, BRIP1*, CDC73, CDH1*, CDK4, CDKN1B, CDKN2A, CHEK2*, CTNNA1, DICER1, FANCC, FH, FLCN, GALNT12, HOXB13, KIT, MAX, MEN1, MET, MLH1*, MRE11A, MSH2*, MSH6*, MUTYH*, NBN, NF1*, NF2, NTHL1, PALB2*, PDGFRA, PHOX2B, PMS2*, POLD1, POLE, POT1, PRKAR1A, PTCH1, PTEN*, RAD50, RAD51C*, RAD51D*, RB1, RET, SDHA, SDHAF2, SDHB, SDHC, SDHD, SMAD4,  SMARCA4, SMARCB1, SMARCE1, STK11, SUFU, TMEM127, TP53*, TSC1, TSC2, VHL and XRCC2 (sequencing and deletion/duplication); CASR, CFTR, CPA1, CTRC, EGFR, MITF, PRSS1 and SPINK1 (sequencing only); EPCAM and GREM1 (deletion/duplication only). DNA and RNA analyses performed for * genes.  The report date is Sep 06, 2018.   09/16/2018 Surgery   Bilateral mastectomies: Left mastectomy benign, right mastectomy: Invasive lobular cancer, multifocal, largest tumor 3.2 cm, grade 2, margins negative, 1/2 lymph nodes with isolated tumor cells, ER 90%, PR 70%, HER-2 -1+, Ki-67 10% T2 N0 I+ stage Ia   09/16/2018 Oncotype testing   17/5%   09/20/2018 Cancer Staging   Staging form: Breast, AJCC 8th Edition - Pathologic stage from 09/20/2018: Stage IA (pT2, pN0(i+)(sn), cM0, G2, ER+, PR+, HER2-) - Signed by Nicholas Lose, MD on 09/20/2018    09/2018 -  Anti-estrogen oral therapy   Tamoxifen daily     CHIEF COMPLIANT: Follow-up of right breast cancer on tamoxifen  INTERVAL HISTORY: Megan Armstrong is a 55 y.o. with above-mentioned history of right breast cancer who underwent bilateral mastectomies and is currently on anti-estrogen therapy with tamoxifen. She presents to the clinic today for follow-up.   ALLERGIES:  is allergic to codeine, penicillins, clindamycin, clindamycin/lincomycin, keflex [cephalexin], and penicillin g.  MEDICATIONS:  Current Outpatient Medications  Medication Sig Dispense Refill   Multiple Vitamins-Minerals (CENTRUM ULTRA WOMENS PO) Take by mouth.     tamoxifen (NOLVADEX) 20 MG tablet TAKE 1 TABLET(20 MG) BY MOUTH DAILY 90 tablet 3   vitamin C (ASCORBIC ACID) 250 MG tablet Take 250 mg by mouth daily.     No current facility-administered medications for this visit.    PHYSICAL EXAMINATION: ECOG PERFORMANCE STATUS: 1 - Symptomatic but  completely ambulatory  Vitals:   03/23/21 1026  BP: 125/60  Pulse: 83  Resp: 18  Temp: 97.7 F (36.5 C)  SpO2: 99%   Filed Weights   03/23/21  1026  Weight: 116 lb 11.2 oz (52.9 kg)    BREAST: No palpable masses or nodules in either right or left breasts. No palpable axillary supraclavicular or infraclavicular adenopathy no breast tenderness or nipple discharge. (exam performed in the presence of a chaperone)  LABORATORY DATA:  I have reviewed the data as listed CMP Latest Ref Rng & Units 01/08/2020 03/14/2019 04/08/2018  Glucose 65 - 99 mg/dL 90 92 86  BUN 7 - 25 mg/dL _0 Creatinine 0.50 - 1.05 mg/dL 0.82 0.79 0.81  Sodium 135 - 146 mmol/L 139 138 135  Potassium 3.5 - 5.3 mmol/L 4.5 4.3 4.1  Chloride 98 - 110 mmol/L 107 105 103  CO2 20 - 32 mmol/L _1 Calcium 8.6 - 10.4 mg/dL 8.9 8.9 9.2  Total Protein 6.1 - 8.1 g/dL 6.5 6.4 6.7  Total Bilirubin 0.2 - 1.2 mg/dL 0.3 0.4 0.4  Alkaline Phos 39 - 117 U/L - 39 40  AST 10 - 35 U/L _2 ALT 6 - 29 U/L _3 Lab Results  Component Value Date   WBC 5.3 01/08/2020   HGB 12.7 01/08/2020   HCT 38.9 01/08/2020   MCV 87.0 01/08/2020   PLT 237 01/08/2020   NEUTROABS 3,207 01/08/2020    ASSESSMENT & PLAN:  Malignant neoplasm of upper-inner quadrant of right breast in female, estrogen receptor positive ([place]) 08/13/2018: Palpable mass at 1230 to 1 o'clock position right breast with skin indentation and focal dimpling UOQ, ultrasound to masses.  12:30 position: 2 cm mass, at 1 o'clock position: 0.9 cm mass, together 3.4 cm.  UOQ: 2 additional masses 0.7 cm and 0.7 cm, indeterminate retroareolar calcifications.   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  T1c N0 stage Ia clinical stage   09/16/2018:Bilateral mastectomies: Left mastectomy benign, right mastectomy: Invasive lobular cancer, multifocal, largest tumor 3.2 cm, grade 2, margins negative, 1/2 lymph nodes with isolated tumor cells, ER 90%, PR 70%, HER-2 -1+, Ki-67 10% T2 N0 I+ stage Ia    Recommendations: 1. Oncotype DX score: 17, risk of distant recurrence 5% at 9 years: No chemo 2. Adjuvant antiestrogen therapy with tamoxifen since she is still premenopausal started 09/28/2018 She is a pharmacist and stays extremely busy. --------------------------------------------------------------------------------------------------------------------------------- Tamoxifen toxicities:  Hot flashes have gotten slightly worse than before. Fatigue and abdominal bloating Her last menstrual cycle was in October 2022.  Dr.Taavon is monitoring her uterine hypertrophy.   Breast cancer surveillance: 1.  Chest wall examination 03/23/2021: No palpable lumps or nodules of concern 2.  No role of imaging studies because she had bilateral mastectomies.   Return to clinic in 1 year for follow-up    No orders of the defined types were placed in this encounter.  The patient has a good understanding of the overall plan. she agrees with it. she will call with any problems that may develop before the next visit here.  Total time spent: 20 mins including face to face time and time spent for planning, charting and coordination of care  Rulon Eisenmenger, MD, MPH 03/23/2021  I, Thana Ates, am acting as scribe for Dr.  Derrick Orris. ° °I have reviewed the above documentation for accuracy and completeness, and I agree with the above. ° ° ° ° ° ° °

## 2021-03-22 NOTE — Assessment & Plan Note (Signed)
08/13/2018:Palpable mass at 1230 to 1 o'clock position right breast with skin indentation and focal dimpling UOQ, ultrasound to masses. 12:30 position: 2 cm mass, at 1 o'clock position: 0.9 cm mass, together 3.4 cm. UOQ: 2 additional masses 0.7 cm and 0.7 cm, indeterminate retroareolar calcifications.  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 T1c N0 stage Ia clinical stage  09/16/2018:Bilateral mastectomies: Left mastectomy benign, right mastectomy: Invasive lobular cancer, multifocal, largest tumor 3.2 cm, grade 2, margins negative, 1/2 lymph nodes with isolated tumor cells, ER 90%, PR 70%, HER-2 -1+, Ki-67 10% T2 N0 I+ stage Ia  Recommendations: 1.Oncotype DXscore: 17, risk of distant recurrence 5% at 9 years: No chemo 2. Adjuvant antiestrogen therapywith tamoxifen since she is still premenopausalstarted 09/28/2018 --------------------------------------------------------------------------------------------------------------------------------- Tamoxifen toxicities: Occasional hot flashes and muscle cramps. Dr.Taavon is monitoring her uterine hypertrophy.  Breast cancer surveillance: 1.Chest wall examination12/14/2022: No palpable lumps or nodules of concern 2.No role of imaging studies because she had bilateral mastectomies.  Return to clinic in 1 year for follow-up

## 2021-03-23 ENCOUNTER — Inpatient Hospital Stay: Payer: Managed Care, Other (non HMO) | Attending: Hematology and Oncology | Admitting: Hematology and Oncology

## 2021-03-23 ENCOUNTER — Other Ambulatory Visit: Payer: Self-pay

## 2021-03-23 DIAGNOSIS — Z79810 Long term (current) use of selective estrogen receptor modulators (SERMs): Secondary | ICD-10-CM | POA: Insufficient documentation

## 2021-03-23 DIAGNOSIS — C50211 Malignant neoplasm of upper-inner quadrant of right female breast: Secondary | ICD-10-CM | POA: Insufficient documentation

## 2021-03-23 DIAGNOSIS — Z170 Estrogen receptor positive status [ER+]: Secondary | ICD-10-CM | POA: Diagnosis not present

## 2021-03-23 MED ORDER — TAMOXIFEN CITRATE 20 MG PO TABS: ORAL_TABLET | ORAL | 3 refills | Status: DC

## 2021-04-06 ENCOUNTER — Ambulatory Visit: Payer: Managed Care, Other (non HMO) | Admitting: Internal Medicine

## 2021-04-25 ENCOUNTER — Ambulatory Visit (INDEPENDENT_AMBULATORY_CARE_PROVIDER_SITE_OTHER): Payer: Managed Care, Other (non HMO) | Admitting: Internal Medicine

## 2021-04-25 ENCOUNTER — Encounter: Payer: Self-pay | Admitting: Internal Medicine

## 2021-04-25 VITALS — BP 116/80 | HR 82 | Temp 98.6°F | Ht 62.5 in | Wt 114.8 lb

## 2021-04-25 DIAGNOSIS — Z23 Encounter for immunization: Secondary | ICD-10-CM

## 2021-04-25 DIAGNOSIS — Z8632 Personal history of gestational diabetes: Secondary | ICD-10-CM

## 2021-04-25 DIAGNOSIS — Z170 Estrogen receptor positive status [ER+]: Secondary | ICD-10-CM | POA: Diagnosis not present

## 2021-04-25 DIAGNOSIS — Z0000 Encounter for general adult medical examination without abnormal findings: Secondary | ICD-10-CM

## 2021-04-25 DIAGNOSIS — C50211 Malignant neoplasm of upper-inner quadrant of right female breast: Secondary | ICD-10-CM

## 2021-04-25 LAB — LIPID PANEL
Cholesterol: 196 mg/dL (ref 0–200)
HDL: 41.5 mg/dL (ref >39.00)
NonHDL: 154.03
Total CHOL/HDL Ratio: 5
Triglycerides: 221 mg/dL — ABNORMAL HIGH (ref 0.0–149.0)
VLDL: 44.2 mg/dL — ABNORMAL HIGH (ref 0.0–40.0)

## 2021-04-25 LAB — CBC WITH DIFFERENTIAL/PLATELET
Basophils Absolute: 0.1 10*3/uL (ref 0.0–0.1)
Basophils Relative: 1 % (ref 0.0–3.0)
Eosinophils Absolute: 0.1 10*3/uL (ref 0.0–0.7)
Eosinophils Relative: 1 % (ref 0.0–5.0)
HCT: 38.8 % (ref 36.0–46.0)
Hemoglobin: 12.6 g/dL (ref 12.0–15.0)
Lymphocytes Relative: 30 % (ref 12.0–46.0)
Lymphs Abs: 1.7 10*3/uL (ref 0.7–4.0)
MCHC: 32.4 g/dL (ref 30.0–36.0)
MCV: 85.2 fl (ref 78.0–100.0)
Monocytes Absolute: 0.3 10*3/uL (ref 0.1–1.0)
Monocytes Relative: 5.3 % (ref 3.0–12.0)
Neutro Abs: 3.6 10*3/uL (ref 1.4–7.7)
Neutrophils Relative %: 62.7 % (ref 43.0–77.0)
Platelets: 273 10*3/uL (ref 150.0–400.0)
RBC: 4.56 Mil/uL (ref 3.87–5.11)
RDW: 14.5 % (ref 11.5–15.5)
WBC: 5.8 10*3/uL (ref 4.0–10.5)

## 2021-04-25 LAB — BASIC METABOLIC PANEL
BUN: 11 mg/dL (ref 6–23)
CO2: 27 mEq/L (ref 19–32)
Calcium: 8.8 mg/dL (ref 8.4–10.5)
Chloride: 106 mEq/L (ref 96–112)
Creatinine, Ser: 0.78 mg/dL (ref 0.40–1.20)
GFR: 85.47 mL/min (ref >60.00)
Glucose, Bld: 91 mg/dL (ref 70–99)
Potassium: 4.4 mEq/L (ref 3.5–5.1)
Sodium: 139 mEq/L (ref 135–145)

## 2021-04-25 LAB — HEPATIC FUNCTION PANEL
ALT: 11 U/L (ref 0–35)
AST: 14 U/L (ref 0–37)
Albumin: 3.7 g/dL (ref 3.5–5.2)
Alkaline Phosphatase: 43 U/L (ref 39–117)
Bilirubin, Direct: 0.1 mg/dL (ref 0.0–0.3)
Total Bilirubin: 0.4 mg/dL (ref 0.2–1.2)
Total Protein: 6.9 g/dL (ref 6.0–8.3)

## 2021-04-25 LAB — LDL CHOLESTEROL, DIRECT: Direct LDL: 111 mg/dL

## 2021-04-25 LAB — HEMOGLOBIN A1C: Hgb A1c MFr Bld: 5.8 % (ref 4.6–6.5)

## 2021-04-25 LAB — TSH: TSH: 2.5 u[IU]/mL (ref 0.35–5.50)

## 2021-04-25 NOTE — Progress Notes (Signed)
Chief Complaint  Patient presents with   Annual Exam    fasting    HPI: Patient  Megan Armstrong  56 y.o. comes in today for Preventive Health Care visit  She is on long-term tamoxifen 10 years for breast cancer status post double mastectomy genetic testing negative for significant markers.  Recently her oldest daughter was hospitalized with significant pneumonia and asthma flare is doing better but had to attend.   May be due for  colon  screening   Dr Collene Mares .  Recommended about every 10 years. Asks about Shingrix vaccine Due to see GYN. Lmo October  is  perimenopausal  Health Maintenance  Topic Date Due   HIV Screening  Never done   Pneumococcal Vaccine 73-20 Years old (1 - PCV) 10/23/2021 (Originally 10/31/1971)   MAMMOGRAM  10/23/2021 (Originally 01/19/2020)   PAP SMEAR-Modifier  10/23/2021 (Originally 03/10/2016)   COLONOSCOPY (Pts 45-62yr Insurance coverage will need to be confirmed)  10/23/2021 (Originally 10/31/2010)   COVID-19 Vaccine (5 - Booster for Pfizer series) 10/23/2021 (Originally 09/09/2020)   Zoster Vaccines- Shingrix (2 of 2) 06/20/2021   TETANUS/TDAP  12/29/2030   INFLUENZA VACCINE  Completed   Hepatitis C Screening  Completed   HPV VACCINES  Aged Out   Health Maintenance Review LIFESTYLE:  Exercise:  very active   Tobacco/ETS: n Alcohol: no Sugar beverages: cautious  Sleep:11 6.30 Drug use: no HH of  2-4  Work:   Daughter had been sick 259- 351 hours  ROS:  REST of 12 system review negative except as per HPI and mild vaginal itching like a yeast infection   Past Medical History:  Diagnosis Date   Allergic rhinitis    Endometrial hyperplasia    Endometriosis    Family history of breast cancer    Family history of breast cancer    Hx of endometriosis    rectal vag with laser surgery    Seasonal allergies    TB skin/subcutaneous    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    Past Surgical History:  Procedure  Laterality Date   DIAGNOSTIC LAPAROSCOPY     MASTECTOMY W/ SENTINEL NODE BIOPSY Bilateral 09/16/2018   Procedure: RIGHT MASTECTOMY WITH RIGHT AXILLARY SENTINEL LYMPH NODE BIOPSY AND LEFT RISK REDUCING MASTECTOMY;  Surgeon: WRolm Bookbinder MD;  [place] [facility]  Service: General;  Laterality: Bilateral;    Family History  Problem Relation Age of Onset   Migraines Sister    Asthma Other    Diabetes Other    Hypertension Other    Sudden death Other    Breast cancer Cousin 512      mat first cousin, BRCA pos    Social History   Socioeconomic History   Marital status: Married    Spouse name: Not on file   Number of children: Not on file   Years of education: Not on file   Highest education level: Not on file  Occupational History   Not on file  Tobacco Use   Smoking status: Never   Smokeless tobacco: Never  Vaping Use   Vaping Use: Never used  Substance and Sexual Activity   Alcohol use: No   Drug use: No   Sexual activity: Not on file  Other Topics Concern   Not on file  Social History Narrative   Borderline spirometry   Married   Regular exercise   decff tea, some coke   2  children at home hh of 4    Pharmacy school internship   Family form [place]   Social Determinants of Health   Financial Resource Strain: Not on file  Food Insecurity: Not on file  Transportation Needs: Not on file  Physical Activity: Not on file  Stress: Not on file  Social Connections: Not on file    Outpatient Medications Prior to Visit  Medication Sig Dispense Refill   Multiple Vitamins-Minerals (CENTRUM ULTRA WOMENS PO) Take by mouth.     tamoxifen (NOLVADEX) 20 MG tablet TAKE 1 TABLET(20 MG) BY MOUTH DAILY 90 tablet 3   vitamin C (ASCORBIC ACID) 250 MG tablet Take 250 mg by mouth daily.     No facility-administered medications prior to visit.     EXAM:  BP 116/80 (BP Location: Right Arm, Patient Position: Sitting, Cuff Size: Normal)    Pulse 82     Temp 98.6 F (37 C) (Oral)    Ht 5' 2.5" (1.588 m)    Wt 114 lb 12.8 oz (52.1 kg)    LMP 01/31/2021    SpO2 99%    BMI 20.66 kg/m   Body mass index is 20.66 kg/m. Wt Readings from Last 3 Encounters:  04/25/21 114 lb 12.8 oz (52.1 kg)  03/23/21 116 lb 11.2 oz (52.9 kg)  03/25/20 115 lb 6.4 oz (52.3 kg)    Physical Exam: Vital signs reviewed MCN:OBSJ is a well-developed well-nourished alert cooperative    who appearsr stated age in no acute distress.  HEENT: normocephalic atraumatic , Eyes: PERRL EOM's full, conjunctiva clear, Nares: paten,t no deformity discharge or tenderness., Ears: no deformity EAC's clear TMs with normal landmarks. Mouth masked NECK: supple without masses, thyromegaly or bruits. CHEST/PULM:  Clear to auscultation and percussion breath sounds equal no wheeze , rales or rhonchi. No chest wall deformities or tenderness. Breast: Absent no nodules axilla is clear CV: PMI is nondisplaced, S1 S2 no gallops, murmurs, rubs. Peripheral pulses are full without delay.No JVD .  ABDOMEN: Bowel sounds normal nontender  No guard or rebound, no hepato splenomegal no CVA tenderness. Extremtities:  No clubbing cyanosis or edema, no acute joint swelling or redness no focal atrophy NEURO:  Oriented x3, cranial nerves 3-12 appear to be intact, no obvious focal weakness,gait within normal limits no abnormal reflexes or asymmetrical SKIN: No acute rashes normal turgor, color, no bruising or petechiae. PSYCH: Oriented, good eye contact, no obvious depression anxiety, cognition and judgment appear normal. LN: no cervical axillary inguinal adenopathy  Lab Results  Component Value Date   WBC 5.3 01/08/2020   HGB 12.7 01/08/2020   HCT 38.9 01/08/2020   PLT 237 01/08/2020   GLUCOSE 90 01/08/2020   CHOL 158 01/08/2020   TRIG 134 01/08/2020   HDL 45 (L) 01/08/2020   LDLCALC 90 01/08/2020   ALT 16 01/08/2020   AST 16 01/08/2020   NA 139 01/08/2020   K 4.5 01/08/2020   CL 107 01/08/2020    CREATININE 0.82 01/08/2020   BUN 19 01/08/2020   CO2 22 01/08/2020   TSH 1.21 01/08/2020   HGBA1C 5.2 01/08/2020    BP Readings from Last 3 Encounters:  04/25/21 116/80  03/23/21 125/60  03/25/20 108/61    Labplan  reviewed with patient   ASSESSMENT AND PLAN:  Discussed the following assessment and plan:    ICD-10-CM   1. Visit for preventive health examination  G28.36 Basic metabolic panel    CBC with Differential/Platelet    Hemoglobin A1c  Hepatic function panel    Lipid panel    TSH    TSH    Lipid panel    Hepatic function panel    Hemoglobin A1c    CBC with Differential/Platelet    Basic metabolic panel    Varicella-zoster vaccine IM (Shingrix)    2. Hx gestational diabetes  X65.53 Basic metabolic panel    CBC with Differential/Platelet    Hemoglobin A1c    Hepatic function panel    Lipid panel    TSH    TSH    Lipid panel    Hepatic function panel    Hemoglobin A1c    CBC with Differential/Platelet    Basic metabolic panel    3. Malignant neoplasm of upper-inner quadrant of right breast in female, estrogen receptor positive ([place])  Z48.270 Basic metabolic panel   B86.7 CBC with Differential/Platelet    Hemoglobin A1c    Hepatic function panel    Lipid panel    TSH    TSH    Lipid panel    Hepatic function panel    Hemoglobin A1c    CBC with Differential/Platelet    Basic metabolic panel     Lab  today shingrix , Lsi  Yearly check . And as indicated  See  instructions Can [place] otc   miconazole if needed 3-7 d and fu with  gyne  Return in about 1 year (around 04/25/2022) for preventive /cpx  , depending on results.  Patient Care Team: Jamaar Howes, Standley Brooking, MD as PCP - General Brien Few, MD as Consulting Physician (Obstetrics and Gynecology) Juanita Craver, MD as Consulting Physician (Gastroenterology) Nicholas Lose, MD as Consulting Physician (Hematology and Oncology) Irene Limbo, MD as Consulting Physician (Plastic Surgery) Rolm Bookbinder, MD as Consulting Physician (General Surgery) Patient Instructions  Good to see you today .  Shingrix 2  in  2-6 months .  Get updated colon cancer screening .  Get appt with your Gyne.       Standley Brooking. Blaiden Werth M.D.

## 2021-04-25 NOTE — Patient Instructions (Signed)
Good to see you today .  Shingrix 2  in  2-6 months .  Get updated colon cancer screening .  Get appt with your Gyne.

## 2021-04-26 ENCOUNTER — Encounter: Payer: Managed Care, Other (non HMO) | Admitting: Internal Medicine

## 2021-04-27 ENCOUNTER — Ambulatory Visit: Payer: Managed Care, Other (non HMO) | Admitting: Family Medicine

## 2021-04-27 VITALS — BP 120/70 | HR 85 | Temp 97.6°F | Wt 113.7 lb

## 2021-04-27 DIAGNOSIS — R238 Other skin changes: Secondary | ICD-10-CM

## 2021-04-27 DIAGNOSIS — T50Z95A Adverse effect of other vaccines and biological substances, initial encounter: Secondary | ICD-10-CM

## 2021-04-27 NOTE — Patient Instructions (Signed)
Suspect allergic/immune reaction to the vaccine  Follow up for any recurrent fever, chills, or progressive redness  Continue with icing 20 minutes 3-4 times daily.    Would avoid further Shingrix vaccine

## 2021-04-27 NOTE — Progress Notes (Signed)
Established Patient Office Visit  Subjective:  Patient ID: Megan Armstrong, female    DOB: 11/20/1965  Age: 56 y.o. MRN: [id number]  CC:  Chief Complaint  Patient presents with   Allergic Reaction    Reaction to shingles vaccine, left arm, injection site very red and inflamed. Pt stated she also had a reaction to the chickpox vaccine in the past.     HPI Megan Armstrong presents for concern for immunization reaction related to Shingrix vaccine which was received 2 days ago on Monday.  She recalls having chickenpox vaccine previously and had similar reaction.  She states Tuesday she had fever up over 101 briefly.  She has redness, warmth, swelling and some itching at the site of the injection.  Overall, feels better today but still has some redness and swelling of the left arm.  She has had previous bilateral mastectomy related to breast cancer with lymph node removal right axilla and she avoids injections in the right arm.  Has applied some ice.  Did not have any generalized hives or dyspnea or angioedema symptoms  Past Medical History:  Diagnosis Date   Allergic rhinitis    Endometrial hyperplasia    Endometriosis    Family history of breast cancer    Family history of breast cancer    Hx of endometriosis    rectal vag with laser surgery    Seasonal allergies    TB skin/subcutaneous    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    Past Surgical History:  Procedure Laterality Date   DIAGNOSTIC LAPAROSCOPY     MASTECTOMY W/ SENTINEL NODE BIOPSY Bilateral 09/16/2018   Procedure: RIGHT MASTECTOMY WITH RIGHT AXILLARY SENTINEL LYMPH NODE BIOPSY AND LEFT RISK REDUCING MASTECTOMY;  Surgeon: Rolm Bookbinder, MD;  Location: [facility];  Service: General;  Laterality: Bilateral;    Family History  Problem Relation Age of Onset   Migraines Sister    Asthma Other    Diabetes Other    Hypertension Other    Sudden death Other    Breast cancer  Cousin 41       mat first cousin, BRCA pos    Social History   Socioeconomic History   Marital status: Married    Spouse name: Not on file   Number of children: Not on file   Years of education: Not on file   Highest education level: Not on file  Occupational History   Not on file  Tobacco Use   Smoking status: Never   Smokeless tobacco: Never  Vaping Use   Vaping Use: Never used  Substance and Sexual Activity   Alcohol use: No   Drug use: No   Sexual activity: Not on file  Other Topics Concern   Not on file  Social History Narrative   Borderline spirometry   Married   Regular exercise   decff tea, some coke   2 children at home hh of 4    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  tamoxifen (NOLVADEX) 20 MG tablet TAKE 1 TABLET(20 MG) BY MOUTH DAILY 90 tablet 3   vitamin C (ASCORBIC ACID) 250 MG tablet Take 250 mg by mouth daily.     No facility-administered medications prior to visit.    Allergies  Allergen Reactions   Codeine    Penicillins     REACTION: rash and swelling to amoxicillin   Clindamycin Rash   Clindamycin/Lincomycin Rash   Keflex [Cephalexin] Rash   Penicillin G Rash    ROS Review of Systems  Constitutional:  Negative for chills.  Respiratory:  Negative for shortness of breath.   Cardiovascular:  Negative for chest pain.     Objective:    Physical Exam Vitals reviewed.  Constitutional:      Appearance: Normal appearance.  Cardiovascular:     Rate and Rhythm: Normal rate and regular rhythm.  Pulmonary:      Effort: Pulmonary effort is normal.     Breath sounds: Normal breath sounds.  Skin:    Comments: She has approximately 11 x 12 cm area of erythema surrounding injection site from recent Shingrix vaccine.  This is warm to touch.  Minimally tender.  Minimally swollen.  No erythematous streaks.  Neurological:     Mental Status: She is alert.    BP 120/70 (BP Location: Left Arm, Patient Position: Sitting, Cuff Size: Normal)    Pulse 85    Temp 97.6 F (36.4 C) (Oral)    Wt 113 lb 11.2 oz (51.6 kg)    SpO2 100%    BMI 20.46 kg/m  Wt Readings from Last 3 Encounters:  04/27/21 113 lb 11.2 oz (51.6 kg)  04/25/21 114 lb 12.8 oz (52.1 kg)  03/23/21 116 lb 11.2 oz (52.9 kg)     Health Maintenance Due  Topic Date Due   HIV Screening  Never done    There are no preventive care reminders to display for this patient.  Lab Results  Component Value Date   TSH 2.50 04/25/2021   Lab Results  Component Value Date   WBC 5.8 04/25/2021   HGB 12.6 04/25/2021   HCT 38.8 04/25/2021   MCV 85.2 04/25/2021   PLT 273.0 04/25/2021   Lab Results  Component Value Date   NA 139 04/25/2021   K 4.4 04/25/2021   CO2 27 04/25/2021   GLUCOSE 91 04/25/2021   BUN 11 04/25/2021   CREATININE 0.78 04/25/2021   BILITOT 0.4 04/25/2021   ALKPHOS 43 04/25/2021   AST 14 04/25/2021   ALT 11 04/25/2021   PROT 6.9 04/25/2021   ALBUMIN 3.7 04/25/2021   CALCIUM 8.8 04/25/2021   GFR 85.47 04/25/2021   Lab Results  Component Value Date   CHOL 196 04/25/2021   Lab Results  Component Value Date   HDL 41.50 04/25/2021   Lab Results  Component Value Date   LDLCALC 90 01/08/2020   Lab Results  Component Value Date   TRIG 221.0 (H) 04/25/2021   Lab Results  Component Value Date   CHOLHDL 5 04/25/2021   Lab Results  Component Value Date   HGBA1C 5.8 04/25/2021      Assessment & Plan:   Problem List Items Addressed This Visit   None Visit Diagnoses     Adverse effect of vaccine, initial  encounter    -  Primary     Suspect local immune reaction/allergic reaction from shingles vaccine.  She did have some fever yesterday but suspect this was more systemic related to immune reaction  from the vaccine.  Suspect this is more likely represents immune reaction versus cellulitis.  -Continue icing 20 minutes 3-4 times daily -Follow-up immediately for any recurrent fever, chills, or any progressive erythema.  Swelling and redness should gradually go down over the next several days -She plans to avoid further Shingrix vaccine  No orders of the defined types were placed in this encounter.   Follow-up: No follow-ups on file.    Carolann Littler, MD

## 2021-05-01 NOTE — Progress Notes (Signed)
Results are normal except  triglycerides  up from last year . No diabetes  but a1c is 5.8 up  some from last year . Expect  getting back  ti attention to healthy eating and activity will help both of these parameters

## 2021-06-07 ENCOUNTER — Encounter: Payer: Self-pay | Admitting: Internal Medicine

## 2021-06-07 ENCOUNTER — Other Ambulatory Visit: Payer: Self-pay

## 2021-06-07 ENCOUNTER — Ambulatory Visit (INDEPENDENT_AMBULATORY_CARE_PROVIDER_SITE_OTHER): Payer: Managed Care, Other (non HMO) | Admitting: Internal Medicine

## 2021-06-07 ENCOUNTER — Ambulatory Visit (INDEPENDENT_AMBULATORY_CARE_PROVIDER_SITE_OTHER): Payer: Managed Care, Other (non HMO)

## 2021-06-07 VITALS — BP 108/74 | HR 75 | Temp 98.5°F | Ht 62.5 in | Wt 113.6 lb

## 2021-06-07 DIAGNOSIS — C50211 Malignant neoplasm of upper-inner quadrant of right female breast: Secondary | ICD-10-CM

## 2021-06-07 DIAGNOSIS — Z853 Personal history of malignant neoplasm of breast: Secondary | ICD-10-CM | POA: Diagnosis not present

## 2021-06-07 DIAGNOSIS — Z170 Estrogen receptor positive status [ER+]: Secondary | ICD-10-CM | POA: Diagnosis not present

## 2021-06-07 DIAGNOSIS — M545 Low back pain, unspecified: Secondary | ICD-10-CM

## 2021-06-07 NOTE — Progress Notes (Signed)
Chief Complaint  Patient presents with   Back Pain    HPI: Megan Armstrong 56 y.o. come in for   problem based visit  Has recurrence of back pain that was similar to the pain that she had Post op 20 20   had pt   and resolved pretty easily Most recently over the last couple weeks has had recurrence of low back pain lumbosacral into the right occasionally radiating.  No specific injury no weakness fever. Thjis is lingering now . Using heat pack  ibuprofen not helping   and brace.   Onset after winter  break.   Packing suit case s travel to see daughters at school [place]. Currently pain is decreased in the morning and as she starts around doing daily activities pain progresses.standing is the worse.  Lateral shooting down thigh time to time not all the time.  Denies weakness lower extremity.  Does do yoga and stretching. ROS: See pertinent positives and negatives per HPI.  No fever falling urinary bowel associated symptoms.  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    Family History  Problem Relation Age of Onset   Migraines Sister    Asthma Other    Diabetes Other    Hypertension Other    Sudden death Other    Breast cancer Cousin 50       mat first cousin, BRCA pos    Social History   Socioeconomic History   Marital status: Married    Spouse name: Not on file   Number of children: Not on file   Years of education: Not on file   Highest education level: Not on file  Occupational History   Not on file  Tobacco Use   Smoking status: Never   Smokeless tobacco: Never  Vaping Use   Vaping Use: Never used  Substance and  Sexual Activity   Alcohol use: No   Drug use: No   Sexual activity: Not on file  Other Topics Concern   Not on file  Social History Narrative   Borderline spirometry   Married   Regular exercise   decff tea, some coke   2 children at home hh of 4    Pharmacy school internship   Family form [place]   Social Determinants of Health   Financial Resource Strain: Not on file  Food Insecurity: Not on file  Transportation Needs: Not on file  Physical Activity: Not on file  Stress: Not on file  Social Connections: Not on file    Outpatient Medications Prior to Visit  Medication Sig Dispense Refill   Multiple Vitamins-Minerals (CENTRUM ULTRA WOMENS PO) Take by mouth.     tamoxifen (NOLVADEX) 20 MG tablet TAKE 1 TABLET(20 MG) BY MOUTH DAILY 90 tablet 3   vitamin C (ASCORBIC ACID) 250 MG tablet Take 250 mg by mouth daily.     No facility-administered medications prior to visit.     EXAM:  BP 108/74 (BP Location: Right Arm, Patient Position: Sitting, Cuff Size: Normal)    Pulse 75  Temp 98.5 F (36.9 C) (Oral)    Ht 5' 2.5" (1.588 m)    Wt 113 lb 9.6 oz (51.5 kg)    SpO2 98%    BMI 20.45 kg/m   Body mass index is 20.45 kg/m.  GENERAL: vitals reviewed and listed above, alert, oriented, appears well hydrated and in no acute distress HEENT: atraumatic, conjunctiva  clear, no obvious abnormalities on inspection of external nose and ears OP : Masked MS: moves all extremities without noticeable focal  abnormality left arm with residual coloration hyperpigmentation from large local reaction of Shingrix vaccine No scoliosis some mid lumbosacral spine tenderness and right SI area tenderness can bend 90 degrees pain on extension. No gross neurologic finding toe heel strength normal negative SLR PSYCH: pleasant and cooperative, no obvious depression or anxiety Lab Results  Component Value Date   WBC 5.8 04/25/2021   HGB 12.6 04/25/2021   HCT 38.8 04/25/2021   PLT 273.0  04/25/2021   GLUCOSE 91 04/25/2021   CHOL 196 04/25/2021   TRIG 221.0 (H) 04/25/2021   HDL 41.50 04/25/2021   LDLDIRECT 111.0 04/25/2021   LDLCALC 90 01/08/2020   ALT 11 04/25/2021   AST 14 04/25/2021   NA 139 04/25/2021   K 4.4 04/25/2021   CL 106 04/25/2021   CREATININE 0.78 04/25/2021   BUN 11 04/25/2021   CO2 27 04/25/2021   TSH 2.50 04/25/2021   HGBA1C 5.8 04/25/2021   BP Readings from Last 3 Encounters:  06/07/21 108/74  04/27/21 120/70  04/25/21 116/80    ASSESSMENT AND PLAN:  Discussed the following assessment and plan:  Lumbar pain - Plan: DG Lumbar Spine Complete  History of breast cancer - Plan: DG Lumbar Spine Complete  Malignant neoplasm of upper-inner quadrant of right breast in female, estrogen receptor positive (HCC) Audibly mechanical pain as in past 2 routine x-ray because of her history of breast cancer but most likely would respond to physical modalities such as physical therapy. If x-ray okay we will do referral physical therapy :last time was done on [employer]. -Patient advised to return or notify health care team  if  new concerns arise.  Patient Instructions  X ray today  if all ok then will do PT referral .    Standley Brooking. Katelynd Blauvelt M.D.

## 2021-06-07 NOTE — Patient Instructions (Signed)
X ray today  if all ok then will do PT referral .

## 2021-06-09 NOTE — Progress Notes (Signed)
X ray shows mild degenerative  changes    . No alarming ,important findings.  Mykal please do referral to physical therapy [address] for  Low back pain  dx   Thanks

## 2021-06-29 LAB — HM PAP SMEAR: HPV, high-risk: NEGATIVE

## 2021-11-30 ENCOUNTER — Telehealth: Payer: Self-pay

## 2021-11-30 NOTE — Telephone Encounter (Signed)
Patient is made aware. No further question

## 2021-11-30 NOTE — Telephone Encounter (Signed)
I agree is reasonable to proceed with  colonoscopy if Dr Collene Mares  agrees

## 2021-11-30 NOTE — Telephone Encounter (Signed)
Patient was in office with daughters. She has concerns about GI sx-gas, bloating. Wanting to do coloscopy and want Dr. Regis Bill opinion on it.   She reports she had a colonoscopy in 2018, was told to do every 10 years. Being dx with breast cancer in 2020 and GI sx is concerning her.  Reports to Dr. Regis Bill, she suggest for patient to see GI team with her sx. Can send in a referral.  Contacted patient, she inform that she had a consult with Dr. Simone Curia yesterday about her sx. Dr. Simone Curia said they can do coloscopy if her insurance cover it.   Please advise.

## 2021-12-09 ENCOUNTER — Ambulatory Visit (INDEPENDENT_AMBULATORY_CARE_PROVIDER_SITE_OTHER): Payer: Managed Care, Other (non HMO)

## 2021-12-09 DIAGNOSIS — Z23 Encounter for immunization: Secondary | ICD-10-CM | POA: Diagnosis not present

## 2021-12-13 ENCOUNTER — Ambulatory Visit (INDEPENDENT_AMBULATORY_CARE_PROVIDER_SITE_OTHER): Payer: Managed Care, Other (non HMO) | Admitting: Family Medicine

## 2021-12-13 ENCOUNTER — Encounter: Payer: Self-pay | Admitting: Family Medicine

## 2021-12-13 VITALS — BP 112/62 | HR 90 | Temp 98.3°F | Ht 62.5 in | Wt 114.1 lb

## 2021-12-13 DIAGNOSIS — M545 Low back pain, unspecified: Secondary | ICD-10-CM

## 2021-12-13 NOTE — Patient Instructions (Signed)
I am setting up physical therapy referral  Let me know if not improving with the PT after 2-3 weeks.    Keep up the walking as tolerated.

## 2021-12-13 NOTE — Progress Notes (Signed)
Established Patient Office Visit  Subjective   Patient ID: Megan Armstrong, female    DOB: 08/27/1965  Age: 56 y.o. MRN: [id number]  Chief Complaint  Patient presents with   Back Pain    Patient complains of back pain, x3 days, Tried Advil with no relief    HPI   Patient is seen with onset within the past week of lower lumbar pain somewhat bilaterally but slightly worse left lower extremity.  She has some radiation to both buttocks.  Walks for exercise and no intolerance with walking.  She has occasional tingling sensation anterior thigh bilaterally.  Good strength.  No urine or stool incontinence.  Appetite and weight stable.  She had similar flareup earlier this year and x-rays revealed no acute findings.  She had mild degenerative changes.  She does have past history of breast cancer with bilateral mastectomies.  No evidence for recurrence.    She also had a similar flare of back pain previously following her breast cancer and responded well to physical therapy.  She has tried some recent ibuprofen without much improvement.  Occasional muscle stiffness.  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 She has never used smokeless tobacco. She reports that she does not drink alcohol and does not  use drugs. family history includes Asthma in an other family member; Breast cancer (age of onset: 52) in her cousin; Diabetes in an other family member; Hypertension in an other family member; Migraines in her sister; Sudden death in an other family member. Allergies  Allergen Reactions   Codeine    Penicillins     REACTION: rash and swelling to amoxicillin   Clindamycin Rash   Clindamycin/Lincomycin Rash   Keflex [Cephalexin] Rash   Penicillin G Rash    Review of Systems  Constitutional:  Negative for chills, fever and weight loss.  Genitourinary:  Negative for dysuria.  Neurological:  Positive for tingling. Negative for weakness.      Objective:     BP 112/62 (BP Location: Left Arm, Patient Position: Sitting, Cuff Size: Normal)   Pulse 90   Temp 98.3 F (36.8 C) (Oral)   Ht 5' 2.5" (1.588 m)   Wt 114 lb 1.6 oz (51.8 kg)   SpO2 98%   BMI 20.54 kg/m    Physical Exam Vitals reviewed.  Constitutional:      Appearance: Normal appearance.  Cardiovascular:     Rate and Rhythm: Normal rate and regular rhythm.  Pulmonary:     Effort: Pulmonary effort is normal.     Breath sounds: Normal breath sounds.  Musculoskeletal:     Comments: Straight leg raises are negative bilaterally  Neurological:     Mental Status: She is alert.  Comments: Full strength lower extremities with plantarflexion, dorsiflexion, and knee extension.  She has 2+ reflexes ankle and knee bilaterally.      No results found for any visits on 12/13/21.    The 10-year ASCVD risk score (Arnett DK, et al., 2019) is: 2.1%    Assessment & Plan:   Problem List Items Addressed This Visit   None Visit Diagnoses     Lumbar pain    -  Primary   Relevant Orders   Ambulatory referral to Physical Therapy     Suspect musculoskeletal back pain.  Nonfocal neuro exam.  Has had similar occurrence in the past.  We did discuss medication options.  She has not gotten much relief with nonsteroidal with  Advil.  We discussed possible use of muscle relaxers but she is reluctant because of risk of side effects.  We did recommend starting some physical therapy.  Be in touch if she is not getting relief with that within the next couple of weeks  No follow-ups on file.    Carolann Littler, MD

## 2021-12-23 ENCOUNTER — Ambulatory Visit: Payer: Managed Care, Other (non HMO) | Attending: Internal Medicine

## 2021-12-23 DIAGNOSIS — M25651 Stiffness of right hip, not elsewhere classified: Secondary | ICD-10-CM | POA: Diagnosis present

## 2021-12-23 DIAGNOSIS — M545 Low back pain, unspecified: Secondary | ICD-10-CM | POA: Insufficient documentation

## 2021-12-23 DIAGNOSIS — M25652 Stiffness of left hip, not elsewhere classified: Secondary | ICD-10-CM | POA: Diagnosis present

## 2021-12-23 DIAGNOSIS — M5459 Other low back pain: Secondary | ICD-10-CM

## 2021-12-23 NOTE — Therapy (Signed)
OUTPATIENT PHYSICAL THERAPY THORACOLUMBAR EVALUATION   Patient Name: Megan Armstrong MRN: [id number] DOB:05/14/1965, 56 y.o., female Today's Date: 12/23/2021   PT End of Session - 12/23/21 1341     Visit Number 1    Number of Visits 4    Date for PT Re-Evaluation 02/17/22    Authorization Type cigna    PT Start Time 1305    PT Stop Time 1345    PT Time Calculation (min) 40 min    Activity Tolerance Patient tolerated treatment well    Behavior During Therapy WFL for tasks assessed/performed             Past Medical History:  Diagnosis Date   Allergic rhinitis    Endometrial hyperplasia    Endometriosis    Family history of breast cancer    Family history of breast cancer    Hx of endometriosis    rectal vag with laser surgery    Seasonal allergies    TB skin/subcutaneous    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   Past Surgical History:  Procedure Laterality Date   DIAGNOSTIC LAPAROSCOPY     MASTECTOMY W/ SENTINEL NODE BIOPSY Bilateral 09/16/2018   Procedure: RIGHT MASTECTOMY WITH RIGHT AXILLARY SENTINEL LYMPH NODE BIOPSY AND LEFT RISK REDUCING MASTECTOMY;  Surgeon: Rolm Bookbinder, MD;  Location: [facility];  Service: General;  Laterality: Bilateral;   Patient Active Problem List   Diagnosis Date Noted   Breast cancer, right ([facility]) 09/16/2018   Genetic testing 09/09/2018   Malignant neoplasm of upper-inner quadrant of right breast in female, estrogen receptor positive ([facility]) 08/19/2018   Family history of breast cancer    History of penicillin allergy 09/24/2012   Exposure to strep throat 09/24/2012   Perioral dermatitis 09/24/2012   Left otitis media with effusion 08/16/2011   Hx of antibiotic allergy 05/03/2011   Unspecified disorder of the teeth and supporting structures 03/30/2008   ALLERGIC RHINITIS 02/17/2008   ENDOMETRIOSIS 02/17/2008   HEADACHE 02/17/2008   Hx gestational diabetes 02/17/2008   POSITIVE PPD  12/17/2007    PCP: Burnis Medin, MD  REFERRING PROVIDER: Burnis Medin, MD  REFERRING DIAG: M54.50 (ICD-10-CM) - Lumbar pain   Rationale for Evaluation and Treatment Rehabilitation  THERAPY DIAG: Lumbar pain   ONSET DATE: 6 months  SUBJECTIVE:                                                                                                                                                                                           SUBJECTIVE STATEMENT: Reports initial pain from Feb 2023, notes  occasional thigh pain B not extending past knees PERTINENT HISTORY:  Patient is seen with onset within the past week of lower lumbar pain somewhat bilaterally but slightly worse left lower extremity.  She has some radiation to both buttocks.  Walks for exercise and no intolerance with walking.  She has occasional tingling sensation anterior thigh bilaterally.  Good strength.  No urine or stool incontinence.  Appetite and weight stable.  She had similar flareup earlier this year and x-rays revealed no acute findings.  She had mild degenerative changes.  She does have past history of breast cancer with bilateral mastectomies.  No evidence for recurrence.     She also had a similar flare of back pain previously following her breast cancer and responded well to physical therapy.  She has tried some recent ibuprofen without much improvement.  Occasional muscle stiffness.  PAIN:  Are you having pain? Yes: NPRS scale: 7/10 Pain location: low back Pain description: ache Aggravating factors: standing/sitting Relieving factors: walking and stretching     PRECAUTIONS: None  WEIGHT BEARING RESTRICTIONS No  FALLS:  Has patient fallen in last 6 months? No  LIVING ENVIRONMENT: Lives with: lives with their family  OCCUPATION: Glass blower/designer  PLOF: Independent  PATIENT GOALS To reduce and manage my low back pain   OBJECTIVE:   DIAGNOSTIC FINDINGS:  CLINICAL DATA:  Breast cancer, low back  pain   EXAM: LUMBAR SPINE - COMPLETE 4+ VIEW   COMPARISON:  None.   FINDINGS: Normal lumbar lordosis. No acute fracture or listhesis of the lumbar spine. Vertebral body height has been preserved. Mild intervertebral disc space narrowing and endplate remodeling at H2-9 is in keeping with mild degenerative disc disease. Remaining intervertebral disc spaces are preserved. Oblique views demonstrate no evidence of pars defect. Paraspinal soft tissues are unremarkable.   IMPRESSION: Mild degenerative disc disease L2-3.     Electronically Signed   By: Fidela Salisbury M.D.   On: 06/09/2021 03:58  PATIENT SURVEYS:  ODI 12/50  SCREENING FOR RED FLAGS:  negative COGNITION:  Overall cognitive status: Within functional limits for tasks assessed     SENSATION: Not tested  MUSCLE LENGTH: Hamstrings: Right 75 deg; Left 75 deg Thomas test: negative  POSTURE: rounded shoulders  PALPATION: Unremarkable   LUMBAR ROM:   Active  A/PROM  eval  Flexion 90%  Extension 75%  Right lateral flexion 90%  Left lateral flexion 90%  Right rotation 50%  Left rotation 50%   (Blank rows = not tested)  LOWER EXTREMITY ROM:   WNL throughout but tight hip capsule B  Active  Right eval Left eval  Hip flexion    Hip extension    Hip abduction    Hip adduction    Hip internal rotation    Hip external rotation    Knee flexion    Knee extension    Ankle dorsiflexion    Ankle plantarflexion    Ankle inversion    Ankle eversion     (Blank rows = not tested)  LOWER EXTREMITY MMT:  WNL  MMT Right eval Left eval  Hip flexion    Hip extension    Hip abduction    Hip adduction    Hip internal rotation    Hip external rotation    Knee flexion    Knee extension    Ankle dorsiflexion    Ankle plantarflexion    Ankle inversion    Ankle eversion     (Blank rows = not tested)  LUMBAR SPECIAL TESTS:  Straight leg raise test: Negative, Slump test: Negative, and FABER test:  Negative  FUNCTIONAL TESTS:  5 times sit to stand: 12s arms crossed  GAIT: Distance walked: [id number] Assistive device utilized: None Level of assistance: Complete Independence    TODAY'S TREATMENT  Eval and HEP   PATIENT EDUCATION:  Education details: Discussed eval findings, rehab rationale and POC and patient is in agreement  Person educated: Patient Education method: Explanation Education comprehension: verbalized understanding   HOME EXERCISE PROGRAM: Access Code: [email or web address]: [email or web address] Date: 12/23/2021 Prepared by: JSharlynn Oliphant Exercises - Supine Quadratus Lumborum Stretch  - 2 x daily - 5 x weekly - 1 sets - 3 reps - 30s hold - Prone Press Up  - 2 x daily - 5 x weekly - 2 sets - 10 reps - Supine Figure 4 Piriformis Stretch  - 2 x daily - 5 x weekly - 1 sets - 3 reps - 30s hold  ASSESSMENT:  CLINICAL IMPRESSION: Patient is a 56y.o. female who was seen today for physical therapy evaluation and treatment for low back pain. No neuro signs detected, mobility deficits noted in lumbar extension as well as B hip capsules, all symptoms appear mechanical in nature.   OBJECTIVE IMPAIRMENTS decreased knowledge of condition, decreased mobility, decreased ROM, impaired flexibility, postural dysfunction, and pain.   ACTIVITY LIMITATIONS bending, sitting, and standing  PERSONAL FACTORS Age, Fitness, and Time since onset of injury/illness/exacerbation are also affecting patient's functional outcome.   REHAB POTENTIAL: Good  CLINICAL DECISION MAKING: Stable/uncomplicated  EVALUATION COMPLEXITY: Low   GOALS: Goals reviewed with patient? No  SHORT TERM GOALS=LONG TERM GOALS: Target date: 01/22/22  Patient to demonstrate independence in HEP  Baseline:[id number] Goal status: INITIAL  2.  Decrease ODI score to 10/50 Baseline: 12/50 Goal status: INITIAL  3.  Increase lumbar extension to 90% Baseline: 75% Goal status: INITIAL  4.  Decrease  worst pain to 2/10 with prolonged sitting/standing Baseline: 7/10 Goal status: INITIAL     PLAN: PT FREQUENCY: 1x/week  PT DURATION: 4 weeks  PLANNED INTERVENTIONS: Therapeutic exercises, Therapeutic activity, Neuromuscular re-education, Balance training, Gait training, Patient/Family education, Self Care, Joint mobilization, Spinal mobilization, Manual therapy, and Re-evaluation.  PLAN FOR NEXT SESSION: HEP update, extension ROM, core strength, lumbar and hip flexibility work.   JLanice Shirts PT 12/23/2021, 2:34 PM

## 2021-12-28 ENCOUNTER — Ambulatory Visit: Payer: Managed Care, Other (non HMO)

## 2021-12-28 DIAGNOSIS — M25651 Stiffness of right hip, not elsewhere classified: Secondary | ICD-10-CM

## 2021-12-28 DIAGNOSIS — M25652 Stiffness of left hip, not elsewhere classified: Secondary | ICD-10-CM

## 2021-12-28 DIAGNOSIS — M5459 Other low back pain: Secondary | ICD-10-CM | POA: Diagnosis not present

## 2021-12-28 NOTE — Therapy (Signed)
OUTPATIENT PHYSICAL THERAPY TREATMENT NOTE   Patient Name: Megan Armstrong MRN: [id number] DOB:1965-08-11, 56 y.o., female Today's Date: 12/28/2021  PCP: Burnis Medin, MD REFERRING PROVIDER: Burnis Medin, MD  END OF SESSION:   PT End of Session - 12/28/21 1226     Visit Number 2    Number of Visits 4    Date for PT Re-Evaluation 02/17/22    Authorization Type cigna    PT Start Time 1225   late for appointment   PT Stop Time 1255    PT Time Calculation (min) 30 min    Activity Tolerance Patient tolerated treatment well    Behavior During Therapy WFL for tasks assessed/performed             Past Medical History:  Diagnosis Date   Allergic rhinitis    Endometrial hyperplasia    Endometriosis    Family history of breast cancer    Family history of breast cancer    Hx of endometriosis    rectal vag with laser surgery    Seasonal allergies    TB skin/subcutaneous    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   Past Surgical History:  Procedure Laterality Date   DIAGNOSTIC LAPAROSCOPY     MASTECTOMY W/ SENTINEL NODE BIOPSY Bilateral 09/16/2018   Procedure: RIGHT MASTECTOMY WITH RIGHT AXILLARY SENTINEL LYMPH NODE BIOPSY AND LEFT RISK REDUCING MASTECTOMY;  Surgeon: Rolm Bookbinder, MD;  Location: [facility];  Service: General;  Laterality: Bilateral;   Patient Active Problem List   Diagnosis Date Noted   Breast cancer, right ([place]) 09/16/2018   Genetic testing 09/09/2018   Malignant neoplasm of upper-inner quadrant of right breast in female, estrogen receptor positive ([place]) 08/19/2018   Family history of breast cancer    History of penicillin allergy 09/24/2012   Exposure to strep throat 09/24/2012   Perioral dermatitis 09/24/2012   Left otitis media with effusion 08/16/2011   Hx of antibiotic allergy 05/03/2011   Unspecified disorder of the teeth and supporting structures 03/30/2008   ALLERGIC RHINITIS 02/17/2008    ENDOMETRIOSIS 02/17/2008   HEADACHE 02/17/2008   Hx gestational diabetes 02/17/2008   POSITIVE PPD 12/17/2007    REFERRING DIAG: M54.50 (ICD-10-CM) - Lumbar pain   THERAPY DIAG: Lumbar pain   Rationale for Evaluation and Treatment Rehabilitation  PERTINENT HISTORY: Patient is seen with onset within the past week of lower lumbar pain somewhat bilaterally but slightly worse left lower extremity.  She has some radiation to both buttocks.  Walks for exercise and no intolerance with walking.  She has occasional tingling sensation anterior thigh bilaterally.  Good strength.  No urine or stool incontinence.  Appetite and weight stable.  She had similar flareup earlier this year and x-rays revealed no acute findings.  She had mild degenerative changes.  She does have past history of breast cancer with bilateral mastectomies.  No evidence for recurrence.     She also had a similar flare of back pain previously following her breast cancer and responded well to physical therapy.  She has tried some recent ibuprofen without much improvement.  Occasional muscle stiffness.  PRECAUTIONS: None  SUBJECTIVE: Has been compliant with HEP, struggles with press ups due to arm strength.  Feels symptoms are slowly resolving  PAIN:  Are you having pain? Yes: NPRS scale: 2/10 Pain location: low back Pain description: ache Aggravating factors: prolonged positions Relieving factors: position changes   OBJECTIVE: (objective measures completed  at initial evaluation unless otherwise dated)    DIAGNOSTIC FINDINGS:  CLINICAL DATA:  Breast cancer, low back pain   EXAM: LUMBAR SPINE - COMPLETE 4+ VIEW   COMPARISON:  None.   FINDINGS: Normal lumbar lordosis. No acute fracture or listhesis of the lumbar spine. Vertebral body height has been preserved. Mild intervertebral disc space narrowing and endplate remodeling at H2-9 is in keeping with mild degenerative disc disease. Remaining intervertebral  disc spaces are preserved. Oblique views demonstrate no evidence of pars defect. Paraspinal soft tissues are unremarkable.   IMPRESSION: Mild degenerative disc disease L2-3.     Electronically Signed   By: Fidela Salisbury M.D.   On: 06/09/2021 03:58   PATIENT SURVEYS:  ODI 12/50   SCREENING FOR RED FLAGS:            negative COGNITION:           Overall cognitive status: Within functional limits for tasks assessed                          SENSATION: Not tested   MUSCLE LENGTH: Hamstrings: Right 75 deg; Left 75 deg Thomas test: negative   POSTURE: rounded shoulders   PALPATION: Unremarkable    LUMBAR ROM:    Active  A/PROM  eval  Flexion 90%  Extension 75%  Right lateral flexion 90%  Left lateral flexion 90%  Right rotation 50%  Left rotation 50%   (Blank rows = not tested)   LOWER EXTREMITY ROM:   WNL throughout but tight hip capsule B   Active  Right eval Left eval  Hip flexion      Hip extension      Hip abduction      Hip adduction      Hip internal rotation      Hip external rotation      Knee flexion      Knee extension      Ankle dorsiflexion      Ankle plantarflexion      Ankle inversion      Ankle eversion       (Blank rows = not tested)   LOWER EXTREMITY MMT:  WNL   MMT Right eval Left eval  Hip flexion      Hip extension      Hip abduction      Hip adduction      Hip internal rotation      Hip external rotation      Knee flexion      Knee extension      Ankle dorsiflexion      Ankle plantarflexion      Ankle inversion      Ankle eversion       (Blank rows = not tested)   LUMBAR SPECIAL TESTS:  Straight leg raise test: Negative, Slump test: Negative, and FABER test: Negative   FUNCTIONAL TESTS:  5 times sit to stand: 12s arms crossed   GAIT: Distance walked: 31fx2 Assistive device utilized: None Level of assistance: Complete Independence       TODAY'S TREATMENT  [facility] Adult PT Treatment:                                                 DATE: 12/28/21 Therapeutic Exercise: Nustep L4 6 min. Curl ups 10x PPT  3s hold 10x QL stretch B 30s x2 90/90 30s x2 Bridge 10x Bridge with ball 10x Open book 10/10 Bird dog 10/10 Supine march with PPT      PATIENT EDUCATION:  Education details: Discussed eval findings, rehab rationale and POC and patient is in agreement  Person educated: Patient Education method: Explanation Education comprehension: verbalized understanding     HOME EXERCISE PROGRAM: Access Code: [email or web address] URL: [email or web address] Date: 12/28/2021 Prepared by: Sharlynn Oliphant  Exercises - Supine Quadratus Lumborum Stretch  - 2 x daily - 5 x weekly - 1 sets - 3 reps - 30s hold - Prone Press Up  - 2 x daily - 5 x weekly - 2 sets - 10 reps - Supine Figure 4 Piriformis Stretch  - 2 x daily - 5 x weekly - 1 sets - 3 reps - 30s hold - Curl Up with Arms Crossed  - 2 x daily - 5 x weekly - 1 sets - 10 reps - Supine Posterior Pelvic Tilt  - 2 x daily - 5 x weekly - 1 sets - 10 reps ASSESSMENT:   CLINICAL IMPRESSION: Today's session focused on continued core and abdominal strength, adding stretches and spinal mobility tasks to promote flexibility.  Updated HEP.  Weakness evident throughout all 4 extremities.     OBJECTIVE IMPAIRMENTS decreased knowledge of condition, decreased mobility, decreased ROM, impaired flexibility, postural dysfunction, and pain.    ACTIVITY LIMITATIONS bending, sitting, and standing   PERSONAL FACTORS Age, Fitness, and Time since onset of injury/illness/exacerbation are also affecting patient's functional outcome.    REHAB POTENTIAL: Good   CLINICAL DECISION MAKING: Stable/uncomplicated   EVALUATION COMPLEXITY: Low     GOALS: Goals reviewed with patient? No   SHORT TERM GOALS=LONG TERM GOALS: Target date: 01/22/22   Patient to demonstrate independence in HEP  Baseline:[id number] Goal status: INITIAL   2.  Decrease ODI score to 10/50 Baseline:  12/50 Goal status: INITIAL   3.  Increase lumbar extension to 90% Baseline: 75% Goal status: INITIAL   4.  Decrease worst pain to 2/10 with prolonged sitting/standing Baseline: 7/10 Goal status: INITIAL         PLAN: PT FREQUENCY: 1x/week   PT DURATION: 4 weeks   PLANNED INTERVENTIONS: Therapeutic exercises, Therapeutic activity, Neuromuscular re-education, Balance training, Gait training, Patient/Family education, Self Care, Joint mobilization, Spinal mobilization, Manual therapy, and Re-evaluation.   PLAN FOR NEXT SESSION: HEP update, extension ROM, core strength, lumbar and hip flexibility work.   Lanice Shirts, PT 12/28/2021, 1:02 PM

## 2022-01-06 ENCOUNTER — Ambulatory Visit: Payer: Managed Care, Other (non HMO)

## 2022-01-06 DIAGNOSIS — M5459 Other low back pain: Secondary | ICD-10-CM

## 2022-01-06 DIAGNOSIS — M25652 Stiffness of left hip, not elsewhere classified: Secondary | ICD-10-CM

## 2022-01-06 DIAGNOSIS — M25651 Stiffness of right hip, not elsewhere classified: Secondary | ICD-10-CM

## 2022-01-06 NOTE — Therapy (Signed)
OUTPATIENT PHYSICAL THERAPY TREATMENT NOTE   Patient Name: Megan Armstrong MRN: [id number] DOB:February 12, 1966, 56 y.o., female Today's Date: 01/06/2022  PCP: Burnis Medin, MD REFERRING PROVIDER: Burnis Medin, MD  END OF SESSION:   PT End of Session - 01/06/22 1308     Visit Number 3    Number of Visits 4    Date for PT Re-Evaluation 02/17/22    Authorization Type cigna    PT Start Time 1307    PT Stop Time 1345    PT Time Calculation (min) 38 min    Activity Tolerance Patient tolerated treatment well    Behavior During Therapy WFL for tasks assessed/performed              Past Medical History:  Diagnosis Date   Allergic rhinitis    Endometrial hyperplasia    Endometriosis    Family history of breast cancer    Family history of breast cancer    Hx of endometriosis    rectal vag with laser surgery    Seasonal allergies    TB skin/subcutaneous    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   Past Surgical History:  Procedure Laterality Date   DIAGNOSTIC LAPAROSCOPY     MASTECTOMY W/ SENTINEL NODE BIOPSY Bilateral 09/16/2018   Procedure: RIGHT MASTECTOMY WITH RIGHT AXILLARY SENTINEL LYMPH NODE BIOPSY AND LEFT RISK REDUCING MASTECTOMY;  Surgeon: Rolm Bookbinder, MD;  Location: [facility];  Service: General;  Laterality: Bilateral;   Patient Active Problem List   Diagnosis Date Noted   Breast cancer, right ([place]) 09/16/2018   Genetic testing 09/09/2018   Malignant neoplasm of upper-inner quadrant of right breast in female, estrogen receptor positive ([place]) 08/19/2018   Family history of breast cancer    History of penicillin allergy 09/24/2012   Exposure to strep throat 09/24/2012   Perioral dermatitis 09/24/2012   Left otitis media with effusion 08/16/2011   Hx of antibiotic allergy 05/03/2011   Unspecified disorder of the teeth and supporting structures 03/30/2008   ALLERGIC RHINITIS 02/17/2008   ENDOMETRIOSIS 02/17/2008    HEADACHE 02/17/2008   Hx gestational diabetes 02/17/2008   POSITIVE PPD 12/17/2007    REFERRING DIAG: M54.50 (ICD-10-CM) - Lumbar pain   THERAPY DIAG: Lumbar pain   Rationale for Evaluation and Treatment Rehabilitation  PERTINENT HISTORY: Patient is seen with onset within the past week of lower lumbar pain somewhat bilaterally but slightly worse left lower extremity.  She has some radiation to both buttocks.  Walks for exercise and no intolerance with walking.  She has occasional tingling sensation anterior thigh bilaterally.  Good strength.  No urine or stool incontinence.  Appetite and weight stable.  She had similar flareup earlier this year and x-rays revealed no acute findings.  She had mild degenerative changes.  She does have past history of breast cancer with bilateral mastectomies.  No evidence for recurrence.     She also had a similar flare of back pain previously following her breast cancer and responded well to physical therapy.  She has tried some recent ibuprofen without much improvement.  Occasional muscle stiffness.  PRECAUTIONS: None  SUBJECTIVE: Symptoms bettre but not resolved.  Flare up when she lifted something heavy but managed with HEP  PAIN:  Are you having pain? Yes: NPRS scale: 2/10 Pain location: low back Pain description: ache Aggravating factors: prolonged positions Relieving factors: position changes   OBJECTIVE: (objective measures completed at initial evaluation unless otherwise  dated)    DIAGNOSTIC FINDINGS:  CLINICAL DATA:  Breast cancer, low back pain   EXAM: LUMBAR SPINE - COMPLETE 4+ VIEW   COMPARISON:  None.   FINDINGS: Normal lumbar lordosis. No acute fracture or listhesis of the lumbar spine. Vertebral body height has been preserved. Mild intervertebral disc space narrowing and endplate remodeling at U9-3 is in keeping with mild degenerative disc disease. Remaining intervertebral disc spaces are preserved. Oblique views  demonstrate no evidence of pars defect. Paraspinal soft tissues are unremarkable.   IMPRESSION: Mild degenerative disc disease L2-3.     Electronically Signed   By: Fidela Salisbury M.D.   On: 06/09/2021 03:58   PATIENT SURVEYS:  ODI 12/50   SCREENING FOR RED FLAGS:            negative COGNITION:           Overall cognitive status: Within functional limits for tasks assessed                          SENSATION: Not tested   MUSCLE LENGTH: Hamstrings: Right 75 deg; Left 75 deg Thomas test: negative   POSTURE: rounded shoulders   PALPATION: Unremarkable    LUMBAR ROM:    Active  A/PROM  eval  Flexion 90%  Extension 75%  Right lateral flexion 90%  Left lateral flexion 90%  Right rotation 50%  Left rotation 50%   (Blank rows = not tested)   LOWER EXTREMITY ROM:   WNL throughout but tight hip capsule B   Active  Right eval Left eval  Hip flexion      Hip extension      Hip abduction      Hip adduction      Hip internal rotation      Hip external rotation      Knee flexion      Knee extension      Ankle dorsiflexion      Ankle plantarflexion      Ankle inversion      Ankle eversion       (Blank rows = not tested)   LOWER EXTREMITY MMT:  WNL   MMT Right eval Left eval  Hip flexion      Hip extension      Hip abduction      Hip adduction      Hip internal rotation      Hip external rotation      Knee flexion      Knee extension      Ankle dorsiflexion      Ankle plantarflexion      Ankle inversion      Ankle eversion       (Blank rows = not tested)   LUMBAR SPECIAL TESTS:  Straight leg raise test: Negative, Slump test: Negative, and FABER test: Negative   FUNCTIONAL TESTS:  5 times sit to stand: 12s arms crossed   GAIT: Distance walked: 8fx2 Assistive device utilized: None Level of assistance: Complete Independence       TODAY'S TREATMENT  [facility] Adult PT Treatment:                                                DATE:  01/06/22 Therapeutic Exercise: Nustep L4 6 min. Front plank 30s x1 Side planks 30s x1 PPT  3s hold 10x QL stretch B 30s x2 PPT with march 10/10 SL bridge 10/10   [facility] Adult PT Treatment:                                                DATE: 12/28/21 Therapeutic Exercise: Nustep L4 6 min. Curl ups 10x PPT 3s hold 10x QL stretch B 30s x2 90/90 30s x2 Bridge 10x Bridge with ball 10x Open book 10/10 Bird dog 10/10 Supine march with PPT      PATIENT EDUCATION:  Education details: Discussed eval findings, rehab rationale and POC and patient is in agreement  Person educated: Patient Education method: Explanation Education comprehension: verbalized understanding     HOME EXERCISE PROGRAM: Access Code: [email or web address] URL: [email or web address] Date: 01/06/2022 Prepared by: Sharlynn Oliphant  Exercises - Supine Quadratus Lumborum Stretch  - 2 x daily - 5 x weekly - 1 sets - 3 reps - 30s hold - Prone Press Up  - 2 x daily - 5 x weekly - 2 sets - 10 reps - Prone Figure 4 Stretch  - 1 x daily - 5 x weekly - 1 sets - 1 reps - 2 min hold - Plank on Knees  - 1 x daily - 5 x weekly - 1 sets - 2 reps - 30s hold - Supine March with Posterior Pelvic Tilt  - 1 x daily - 5 x weekly - 1 sets - 10 reps - 3s hold ASSESSMENT:   CLINICAL IMPRESSION: Addressed strength and flexibility to update HEP as needed.  Increased resistance on tasks to further challenge core strength.  Added more aggressive hip capsule s well as more advanced core tasks.     OBJECTIVE IMPAIRMENTS decreased knowledge of condition, decreased mobility, decreased ROM, impaired flexibility, postural dysfunction, and pain.    ACTIVITY LIMITATIONS bending, sitting, and standing   PERSONAL FACTORS Age, Fitness, and Time since onset of injury/illness/exacerbation are also affecting patient's functional outcome.    REHAB POTENTIAL: Good   CLINICAL DECISION MAKING: Stable/uncomplicated   EVALUATION COMPLEXITY: Low      GOALS: Goals reviewed with patient? No   SHORT TERM GOALS=LONG TERM GOALS: Target date: 01/22/22   Patient to demonstrate independence in HEP  Baseline:[id number] Goal status: INITIAL   2.  Decrease ODI score to 10/50 Baseline: 12/50 Goal status: INITIAL   3.  Increase lumbar extension to 90% Baseline: 75% Goal status: INITIAL   4.  Decrease worst pain to 2/10 with prolonged sitting/standing Baseline: 7/10 Goal status: INITIAL         PLAN: PT FREQUENCY: 1x/week   PT DURATION: 4 weeks   PLANNED INTERVENTIONS: Therapeutic exercises, Therapeutic activity, Neuromuscular re-education, Balance training, Gait training, Patient/Family education, Self Care, Joint mobilization, Spinal mobilization, Manual therapy, and Re-evaluation.   PLAN FOR NEXT SESSION: HEP update, extension ROM, core strength, lumbar and hip flexibility work.   Lanice Shirts, PT 01/06/2022, 1:12 PM

## 2022-01-13 ENCOUNTER — Ambulatory Visit: Payer: Managed Care, Other (non HMO) | Attending: Internal Medicine

## 2022-01-13 DIAGNOSIS — M25652 Stiffness of left hip, not elsewhere classified: Secondary | ICD-10-CM | POA: Diagnosis present

## 2022-01-13 DIAGNOSIS — M25651 Stiffness of right hip, not elsewhere classified: Secondary | ICD-10-CM | POA: Diagnosis present

## 2022-01-13 DIAGNOSIS — M5459 Other low back pain: Secondary | ICD-10-CM

## 2022-01-13 NOTE — Therapy (Signed)
OUTPATIENT PHYSICAL THERAPY TREATMENT NOTE/DC SUMMARY   Patient Name: Megan Armstrong MRN: [id number] DOB:[birth year], 56 y.o., female 18 Date: 01/13/2022  PCP: Burnis Medin, MD REFERRING PROVIDER: Burnis Medin, MD PHYSICAL THERAPY DISCHARGE SUMMARY  Visits from Start of Care: 4  Current functional level related to goals / functional outcomes: Goals met   Remaining deficits: flexibility   Education / Equipment: HEP   Patient agrees to discharge. Patient goals were met. Patient is being discharged due to being pleased with the current functional level.  END OF SESSION:   PT End of Session - 01/13/22 1313     Visit Number 4    Number of Visits 4    Date for PT Re-Evaluation 02/17/22    Authorization Type cigna    PT Start Time 1315   patient late for session   PT Stop Time 1345    PT Time Calculation (min) 30 min    Activity Tolerance Patient tolerated treatment well    Behavior During Therapy WFL for tasks assessed/performed              Past Medical History:  Diagnosis Date   Allergic rhinitis    Endometrial hyperplasia    Endometriosis    Family history of breast cancer    Family history of breast cancer    Hx of endometriosis    rectal vag with laser surgery    Seasonal allergies    TB skin/subcutaneous    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   Past Surgical History:  Procedure Laterality Date   DIAGNOSTIC LAPAROSCOPY     MASTECTOMY W/ SENTINEL NODE BIOPSY Bilateral 09/16/2018   Procedure: RIGHT MASTECTOMY WITH RIGHT AXILLARY SENTINEL LYMPH NODE BIOPSY AND LEFT RISK REDUCING MASTECTOMY;  Surgeon: Rolm Bookbinder, MD;  Location: [facility];  Service: General;  Laterality: Bilateral;   Patient Active Problem List   Diagnosis Date Noted   Breast cancer, right ([place]) 09/16/2018   Genetic testing 09/09/2018   Malignant neoplasm of upper-inner quadrant of right breast in female, estrogen receptor  positive ([place]) 08/19/2018   Family history of breast cancer    History of penicillin allergy 09/24/2012   Exposure to strep throat 09/24/2012   Perioral dermatitis 09/24/2012   Left otitis media with effusion 08/16/2011   Hx of antibiotic allergy 05/03/2011   Unspecified disorder of the teeth and supporting structures 03/30/2008   ALLERGIC RHINITIS 02/17/2008   ENDOMETRIOSIS 02/17/2008   HEADACHE 02/17/2008   Hx gestational diabetes 02/17/2008   POSITIVE PPD 12/17/2007    REFERRING DIAG: M54.50 (ICD-10-CM) - Lumbar pain   THERAPY DIAG: Lumbar pain   Rationale for Evaluation and Treatment Rehabilitation  PERTINENT HISTORY: Patient is seen with onset within the past week of lower lumbar pain somewhat bilaterally but slightly worse left lower extremity.  She has some radiation to both buttocks.  Walks for exercise and no intolerance with walking.  She has occasional tingling sensation anterior thigh bilaterally.  Good strength.  No urine or stool incontinence.  Appetite and weight stable.  She had similar flareup earlier this year and x-rays revealed no acute findings.  She had mild degenerative changes.  She does have past history of breast cancer with bilateral mastectomies.  No evidence for recurrence.     She also had a similar flare of back pain previously following her breast cancer and responded well to physical therapy.  She has tried some recent ibuprofen without much improvement.  Occasional muscle stiffness.  PRECAUTIONS: None  SUBJECTIVE: Pain essentially resolved and able to manage any symptoms with a HEP.  Feels ready to DC at this time  PAIN:  Are you having pain? Yes: NPRS scale: 2/10 Pain location: low back Pain description: ache Aggravating factors: prolonged positions Relieving factors: position changes  01/13/22 5/10 at worst, 0/10 currently   OBJECTIVE: (objective measures completed at initial evaluation unless otherwise dated)    DIAGNOSTIC FINDINGS:   CLINICAL DATA:  Breast cancer, low back pain   EXAM: LUMBAR SPINE - COMPLETE 4+ VIEW   COMPARISON:  None.   FINDINGS: Normal lumbar lordosis. No acute fracture or listhesis of the lumbar spine. Vertebral body height has been preserved. Mild intervertebral disc space narrowing and endplate remodeling at Q0-3 is in keeping with mild degenerative disc disease. Remaining intervertebral disc spaces are preserved. Oblique views demonstrate no evidence of pars defect. Paraspinal soft tissues are unremarkable.   IMPRESSION: Mild degenerative disc disease L2-3.     Electronically Signed   By: Fidela Salisbury M.D.   On: 06/09/2021 03:58   PATIENT SURVEYS:  ODI 12/50; 01/13/22 2/50   SCREENING FOR RED FLAGS:            negative COGNITION:           Overall cognitive status: Within functional limits for tasks assessed                          SENSATION: Not tested   MUSCLE LENGTH: Hamstrings: Right 75 deg; Left 75 deg Thomas test: negative   POSTURE: rounded shoulders   PALPATION: Unremarkable    LUMBAR ROM:    Active  A/PROM  eval AROM 01/13/22  Flexion 90% 90%  Extension 75% 90%  Right lateral flexion 90% 90%  Left lateral flexion 90% 90%  Right rotation 50% 90%  Left rotation 50% 90%   (Blank rows = not tested)   LOWER EXTREMITY ROM:   WNL throughout but tight hip capsule B   Active  Right eval Left eval  Hip flexion      Hip extension      Hip abduction      Hip adduction      Hip internal rotation      Hip external rotation      Knee flexion      Knee extension      Ankle dorsiflexion      Ankle plantarflexion      Ankle inversion      Ankle eversion       (Blank rows = not tested)   LOWER EXTREMITY MMT:  WNL   MMT Right eval Left eval  Hip flexion      Hip extension      Hip abduction      Hip adduction      Hip internal rotation      Hip external rotation      Knee flexion      Knee extension      Ankle dorsiflexion      Ankle  plantarflexion      Ankle inversion      Ankle eversion       (Blank rows = not tested)   LUMBAR SPECIAL TESTS:  Straight leg raise test: Negative, Slump test: Negative, and FABER test: Negative   FUNCTIONAL TESTS:  5 times sit to stand: 12s arms crossed   GAIT: Distance walked: 11fx2 Assistive device utilized: None  Level of assistance: Complete Independence       TODAY'S TREATMENT  [facility] Adult PT Treatment:                                                DATE: 01/13/22 Therapeutic Exercise: Nustep L 46 min Prone press w/OP 10x Curl ups 10x Bird dog with knee taps 5/5 Prone figure 4 R 2 min Plank on knees 30s x2  [facility] Adult PT Treatment:                                                DATE: 01/06/22 Therapeutic Exercise: Nustep L4 6 min. Front plank 30s x1 Side planks 30s x1 PPT 3s hold 10x QL stretch B 30s x2 PPT with march 10/10 SL bridge 10/10   [facility] Adult PT Treatment:                                                DATE: 12/28/21 Therapeutic Exercise: Nustep L4 6 min. Curl ups 10x PPT 3s hold 10x QL stretch B 30s x2 90/90 30s x2 Bridge 10x Bridge with ball 10x Open book 10/10 Bird dog 10/10 Supine march with PPT      PATIENT EDUCATION:  Education details: Discussed eval findings, rehab rationale and POC and patient is in agreement  Person educated: Patient Education method: Explanation Education comprehension: verbalized understanding     HOME EXERCISE PROGRAM: Access Code: [email or web address] URL: [email or web address] Date: 01/06/2022 Prepared by: Sharlynn Oliphant  Exercises - Supine Quadratus Lumborum Stretch  - 2 x daily - 5 x weekly - 1 sets - 3 reps - 30s hold - Prone Press Up  - 2 x daily - 5 x weekly - 2 sets - 10 reps - Prone Figure 4 Stretch  - 1 x daily - 5 x weekly - 1 sets - 1 reps - 2 min hold - Plank on Knees  - 1 x daily - 5 x weekly - 1 sets - 2 reps - 30s hold - Supine March with Posterior Pelvic Tilt  - 1 x daily - 5 x weekly -  1 sets - 10 reps - 3s hold ASSESSMENT:   CLINICAL IMPRESSION: Goals met, patient feels ready for independent management     OBJECTIVE IMPAIRMENTS decreased knowledge of condition, decreased mobility, decreased ROM, impaired flexibility, postural dysfunction, and pain.    ACTIVITY LIMITATIONS bending, sitting, and standing   PERSONAL FACTORS Age, Fitness, and Time since onset of injury/illness/exacerbation are also affecting patient's functional outcome.    REHAB POTENTIAL: Good   CLINICAL DECISION MAKING: Stable/uncomplicated   EVALUATION COMPLEXITY: Low     GOALS: Goals reviewed with patient? No   SHORT TERM GOALS=LONG TERM GOALS: Target date: 01/22/22   Patient to demonstrate independence in HEP  Baseline:[id number] Goal status: Met   2.  Decrease ODI score to 10/50 Baseline: 12/50; 01/13/22 2/50 Goal status: Met   3.  Increase lumbar extension to 90% Baseline: 75%; 01/13/22 90% Goal status: Met   4.  Decrease worst pain to 2/10 with prolonged sitting/standing Baseline: 7/10; 2/10 average worst pain,  0/10 resting pain Goal status: Met         PLAN: PT FREQUENCY: 1x/week   PT DURATION: 4 weeks   PLANNED INTERVENTIONS: Therapeutic exercises, Therapeutic activity, Neuromuscular re-education, Balance training, Gait training, Patient/Family education, Self Care, Joint mobilization, Spinal mobilization, Manual therapy, and Re-evaluation.   PLAN FOR NEXT SESSION: DC   Lanice Shirts, PT 01/13/2022, 1:14 PM

## 2022-01-13 NOTE — Patient Instructions (Signed)

## 2022-01-31 ENCOUNTER — Telehealth: Payer: Self-pay | Admitting: Internal Medicine

## 2022-01-31 NOTE — Telephone Encounter (Signed)
Pt called requesting to speak with CMA. CMA was unavailable.  Pt was asked if call was regarding a medication refill, as I could help with that.  Pt stated it was for something else and would rather speak directly to CMA. Pt asked that CMA call back at their earliest convenience.

## 2022-01-31 NOTE — Telephone Encounter (Signed)
Good morning, this Pt called this morning to ask if MD is still willing to take on her niece as a new patient, as per a previous discussion?  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  Respectfully, IC

## 2022-02-02 NOTE — Telephone Encounter (Signed)
Fyi as one was already sent you for an advise.   Spoke to patient. She informs this is regarding new patient- if Dr. Regis Bill is okay to see her niece as new patient as discussed sometimes ago. She said her niece will be staying with her in [place] for 2 months.

## 2022-02-03 NOTE — Telephone Encounter (Signed)
Ok to see her niece as a new patient .

## 2022-02-03 NOTE — Telephone Encounter (Signed)
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

## 2022-02-06 NOTE — Telephone Encounter (Signed)
See other note

## 2022-02-07 NOTE — Telephone Encounter (Signed)
Contacted patient to update. Left a voicemail to call back.

## 2022-02-07 NOTE — Telephone Encounter (Signed)
My apologies for the delay:  Megan Armstrong had to put in a ticket with IT, because she was having difficulty unblocking the 02/14/22 at 10 am slot for this new patient.   Once slot is unblocked, Pt will be officially scheduled.

## 2022-02-20 LAB — HM COLONOSCOPY

## 2022-02-23 ENCOUNTER — Encounter: Payer: Self-pay | Admitting: Internal Medicine

## 2022-03-28 ENCOUNTER — Other Ambulatory Visit: Payer: Self-pay

## 2022-03-28 ENCOUNTER — Inpatient Hospital Stay: Payer: Managed Care, Other (non HMO) | Attending: Hematology and Oncology | Admitting: Hematology and Oncology

## 2022-03-28 VITALS — BP 128/77 | HR 71 | Temp 97.5°F | Resp 18 | Ht 62.5 in | Wt 111.1 lb

## 2022-03-28 DIAGNOSIS — Z170 Estrogen receptor positive status [ER+]: Secondary | ICD-10-CM | POA: Insufficient documentation

## 2022-03-28 DIAGNOSIS — Z79810 Long term (current) use of selective estrogen receptor modulators (SERMs): Secondary | ICD-10-CM | POA: Diagnosis not present

## 2022-03-28 DIAGNOSIS — C50211 Malignant neoplasm of upper-inner quadrant of right female breast: Secondary | ICD-10-CM | POA: Insufficient documentation

## 2022-03-28 DIAGNOSIS — N951 Menopausal and female climacteric states: Secondary | ICD-10-CM | POA: Diagnosis not present

## 2022-03-28 MED ORDER — TAMOXIFEN CITRATE 20 MG PO TABS: ORAL_TABLET | ORAL | 3 refills | Status: DC

## 2022-03-28 NOTE — Progress Notes (Signed)
Patient Care Team: Panosh, Standley Brooking, MD as PCP - General Brien Few, MD as Consulting Physician (Obstetrics and Gynecology) Juanita Craver, MD as Consulting Physician (Gastroenterology) Nicholas Lose, MD as Consulting Physician (Hematology and Oncology) Irene Limbo, MD as Consulting Physician (Plastic Surgery) Rolm Bookbinder, MD as Consulting Physician (General Surgery)  DIAGNOSIS:  Encounter Diagnosis  Name Primary?   Malignant neoplasm of upper-inner quadrant of right breast in female, estrogen receptor positive ([place]) Yes    SUMMARY OF ONCOLOGIC HISTORY: Oncology History  Malignant neoplasm of upper-inner quadrant of right breast in female, estrogen receptor positive ([place])  08/13/2018 Initial Diagnosis   Palpable mass at 1230 to 1 o'clock position right breast with skin indentation and focal dimpling UOQ, ultrasound to masses.  12:30 position: 2 cm mass, at 1 o'clock position: 0.9 cm mass, together 3.4 cm.  UOQ: 2 additional masses 0.7 cm and 0.7 cm, indeterminate retroareolar calcifications.   08/13/2018 Pathology Results   Right breast needle core biopsy 1230: Grade 2 ILC with LCIS, UOQ 10:30 position: Grade 2 ILC with LCIS, ER 90%, PR 30%, Ki-67 10%, HER-2 -1+ by IHC.  T1c N0 stage Ia clinical stage   09/06/2018 Genetic Testing   Negative genetic testing on the CustomNext-Cancer+RNAinsight testing. 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  The CustomNext-Expanded gene panel offered by [facility] and includes sequencing and rearrangement analysis for the following 81 genes: AIP, ALK, APC*, ATM*, AXIN2, BAP1, BARD1, BLM, BMPR1A, BRCA1*, BRCA2*, BRIP1*, CDC73, CDH1*, CDK4, CDKN1B, CDKN2A, CHEK2*, CTNNA1, DICER1, FANCC, FH, FLCN, GALNT12, HOXB13, KIT, MAX, MEN1, MET, MLH1*, MRE11A, MSH2*, MSH6*, MUTYH*, NBN, NF1*, NF2, NTHL1, PALB2*, PDGFRA, PHOX2B, PMS2*, POLD1, POLE, POT1, PRKAR1A, PTCH1, PTEN*, RAD50, RAD51C*, RAD51D*, RB1, RET, SDHA, SDHAF2, SDHB, SDHC, SDHD, SMAD4,  SMARCA4, SMARCB1, SMARCE1, STK11, SUFU, TMEM127, TP53*, TSC1, TSC2, VHL and XRCC2 (sequencing and deletion/duplication); CASR, CFTR, CPA1, CTRC, EGFR, MITF, PRSS1 and SPINK1 (sequencing only); EPCAM and GREM1 (deletion/duplication only). DNA and RNA analyses performed for * genes.  The report date is Sep 06, 2018.   09/16/2018 Surgery   Bilateral mastectomies: Left mastectomy benign, right mastectomy: Invasive lobular cancer, multifocal, largest tumor 3.2 cm, grade 2, margins negative, 1/2 lymph nodes with isolated tumor cells, ER 90%, PR 70%, HER-2 -1+, Ki-67 10% T2 N0 I+ stage Ia   09/16/2018 Oncotype testing   17/5%   09/20/2018 Cancer Staging   Staging form: Breast, AJCC 8th Edition - Pathologic stage from 09/20/2018: Stage IA (pT2, pN0(i+)(sn), cM0, G2, ER+, PR+, HER2-) - Signed by Nicholas Lose, MD on 09/20/2018   09/2018 -  Anti-estrogen oral therapy   Tamoxifen daily     CHIEF COMPLIANT: Follow-up of right breast cancer on tamoxifen   INTERVAL HISTORY: Megan Armstrong is a 56 y.o. with above-mentioned history of right breast cancer who underwent bilateral mastectomies and is currently on anti-estrogen therapy with tamoxifen. She presents to the clinic today for follow-up. She reports hot flashes and leg cramps at night. She states she is ok during the day time. The abdominal bloating seems to be resolving. She does get some discomfort and swelling in chest when she does physical activities that comes and goes.   ALLERGIES:  is allergic to codeine, penicillins, clindamycin, clindamycin/lincomycin, keflex [cephalexin], and penicillin g.  MEDICATIONS:  Current Outpatient Medications  Medication Sig Dispense Refill   CLENPIQ 10-3.5-12 MG-GM -GM/175ML SOLN Take by mouth.     Multiple Vitamins-Minerals (CENTRUM ULTRA WOMENS PO) Take by mouth.     Pediatric Multivitamins-Fl (MULTIVITAMIN + FLUORIDE)  0.25 MG CHEW Multivitamin     vitamin C (ASCORBIC ACID) 250 MG tablet Take 250 mg by mouth  daily.     tamoxifen (NOLVADEX) 20 MG tablet TAKE 1 TABLET(20 MG) BY MOUTH DAILY 90 tablet 3   No current facility-administered medications for this visit.    PHYSICAL EXAMINATION: ECOG PERFORMANCE STATUS: 1 - Symptomatic but completely ambulatory  Vitals:   03/28/22 1006  BP: 128/77  Pulse: 71  Resp: 18  Temp: (!) 97.5 F (36.4 C)  SpO2: 100%   Filed Weights   03/28/22 1006  Weight: 111 lb 1.6 oz (50.4 kg)    BREAST: Bilateral mastectomies.  Chest wall scars without any lumps or nodules.  (exam performed in the presence of a chaperone)  LABORATORY DATA:  I have reviewed the data as listed    Latest Ref Rng & Units 04/25/2021    9:13 AM 01/08/2020    8:22 AM 03/14/2019    7:39 AM  CMP  Glucose 70 - 99 mg/dL 91  90  92   BUN 6 - 23 mg/dL _0 Creatinine 0.40 - 1.20 mg/dL 0.78  0.82  0.79   Sodium 135 - 145 mEq/L 139  139  138   Potassium 3.5 - 5.1 mEq/L 4.4  4.5  4.3   Chloride 96 - 112 mEq/L 106  107  105   CO2 19 - 32 mEq/L _1 Calcium 8.4 - 10.5 mg/dL 8.8  8.9  8.9   Total Protein 6.0 - 8.3 g/dL 6.9  6.5  6.4   Total Bilirubin 0.2 - 1.2 mg/dL 0.4  0.3  0.4   Alkaline Phos 39 - 117 U/L 43   39   AST 0 - 37 U/L _2 ALT 0 - 35 U/L _3 Lab Results  Component Value Date   WBC 5.8 04/25/2021   HGB 12.6 04/25/2021   HCT 38.8 04/25/2021   MCV 85.2 04/25/2021   PLT 273.0 04/25/2021   NEUTROABS 3.6 04/25/2021    ASSESSMENT & PLAN:  Malignant neoplasm of upper-inner quadrant of right breast in female, estrogen receptor positive ([place]) 08/13/2018: Palpable mass at 1230 to 1 o'clock position right breast with skin indentation and focal dimpling UOQ, ultrasound to masses.  12:30 position: 2 cm mass, at 1 o'clock position: 0.9 cm mass, together 3.4 cm.  UOQ: 2 additional masses 0.7 cm and 0.7 cm, indeterminate retroareolar calcifications.   Right breast needle core biopsy 1230: Grade 2 ILC with LCIS, UOQ 10:30 position: Grade 2 ILC with  LCIS, ER 90%, PR 30%, Ki-67 10%, HER-2 -1+ by IHC.  T1c N0 stage Ia clinical stage   09/16/2018:Bilateral mastectomies: Left mastectomy benign, right mastectomy: Invasive lobular cancer, multifocal, largest tumor 3.2 cm, grade 2, margins negative, 1/2 lymph nodes with isolated tumor cells, ER 90%, PR 70%, HER-2 -1+, Ki-67 10% T2 N0 I+ stage Ia   Recommendations: 1. Oncotype DX score: 17, risk of distant recurrence 5% at 9 years: No chemo 2. Adjuvant antiestrogen therapy with tamoxifen since she is still premenopausal started 09/28/2018 She is a pharmacist and stays extremely busy. --------------------------------------------------------------------------------------------------------------------------------- Tamoxifen toxicities:  Hot flashes have gotten slightly worse than before. Fatigue and abdominal bloating Her last menstrual cycle was in October 2022.   Dr.Taavon is monitoring her uterine hypertrophy.  I recommended annual GYN follow-ups   Breast cancer surveillance: 1.  Chest wall examination 03/28/2022: No palpable lumps or nodules of concern 2.  No role of imaging studies because she had bilateral mastectomies.   Return to clinic in 1 year for follow-up     No orders of the defined types were placed in this encounter.  The patient has a good understanding of the overall plan. she agrees with it. she will call with any problems that may develop before the next visit here. Total time spent: 30 mins including face to face time and time spent for planning, charting and co-ordination of care   Megan Ohara, MD 03/28/22    I Gardiner Coins am acting as a Education administrator for [employer]  I have reviewed the above documentation for accuracy and completeness, and I agree with the above.

## 2022-03-28 NOTE — Assessment & Plan Note (Addendum)
08/13/2018: Palpable mass at 1230 to 1 o'clock position right breast with skin indentation and focal dimpling UOQ, ultrasound to masses.  12:30 position: 2 cm mass, at 1 o'clock position: 0.9 cm mass, together 3.4 cm.  UOQ: 2 additional masses 0.7 cm and 0.7 cm, indeterminate retroareolar calcifications.   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  T1c N0 stage Ia clinical stage   09/16/2018:Bilateral mastectomies: Left mastectomy benign, right mastectomy: Invasive lobular cancer, multifocal, largest tumor 3.2 cm, grade 2, margins negative, 1/2 lymph nodes with isolated tumor cells, ER 90%, PR 70%, HER-2 -1+, Ki-67 10% T2 N0 I+ stage Ia   Recommendations: 1. Oncotype DX score: 17, risk of distant recurrence 5% at 9 years: No chemo 2. Adjuvant antiestrogen therapy with tamoxifen since she is still premenopausal started 09/28/2018 She is a pharmacist and stays extremely busy. --------------------------------------------------------------------------------------------------------------------------------- Tamoxifen toxicities:  Hot flashes have gotten slightly worse than before. Fatigue and abdominal bloating Her last menstrual cycle was in October 2022.   Dr.Taavon is monitoring her uterine hypertrophy.  I recommended annual GYN follow-ups   Breast cancer surveillance: 1.  Chest wall examination 03/28/2022: No palpable lumps or nodules of concern 2.  No role of imaging studies because she had bilateral mastectomies.   Return to clinic in 1 year for follow-up

## 2022-03-31 ENCOUNTER — Telehealth: Payer: Self-pay | Admitting: Internal Medicine

## 2022-03-31 NOTE — Telephone Encounter (Signed)
Pt needs [facility] Screening Form filled out- placed in dr's folder.  Call [phone] upon completion.  Patient needs form before the end of next week.

## 2022-03-31 NOTE — Telephone Encounter (Signed)
Pt called & notified that last CPE was 04/25/21. She states information should be current for the year & requests that I use labs/assessment for 04/25/21.   Informed form will be completed today & placed in file cabinet in front office. Pt states she will pick up form on 04/04/22.

## 2022-04-18 ENCOUNTER — Telehealth: Payer: Self-pay | Admitting: Internal Medicine

## 2022-04-18 NOTE — Telephone Encounter (Signed)
Please advise 

## 2022-04-18 NOTE — Telephone Encounter (Signed)
Pt would like to request an order for labs, that she can come pick up and take to her own facility, before or after her CPE scheduled for 06/08/22.  Pt stated she had a bad experience at our lab and would prefer to use her own lab.  Please advise.

## 2022-04-19 ENCOUNTER — Other Ambulatory Visit: Payer: Self-pay

## 2022-04-19 DIAGNOSIS — Z853 Personal history of malignant neoplasm of breast: Secondary | ICD-10-CM

## 2022-04-19 NOTE — Telephone Encounter (Signed)
Called patient message given okay to place lab orders per Dr. Elease Hashimoto.  Patient to have labs drawn at [facility] labs.    Lab orders place with resulting agency [facility].     Patient to come to office to pick up hard copies of lab orders.

## 2022-04-19 NOTE — Telephone Encounter (Addendum)
Dr. Velora Mediate patient.     See below messages.      Called patient and message below given.   Patient stated she had an office visit on 12/13/21 with Dr. Elease Hashimoto.        Patient requested if message can be forwarded to Dr. Elease Hashimoto for possible approval, due to patient is a double mastectomy patient, and one arm is severely scarred due to cancer history and the phlebotomist at [facility] at  her job is use to drawing blood from her .    Please advise.

## 2022-04-25 IMAGING — DX DG LUMBAR SPINE COMPLETE 4+V
5 series · 5 of 5 positions shown · non-contrast
Comparison: None.

CLINICAL DATA: Breast cancer, low back pain

EXAM:
LUMBAR SPINE - COMPLETE 4+ VIEW

[lumbar spine ap]
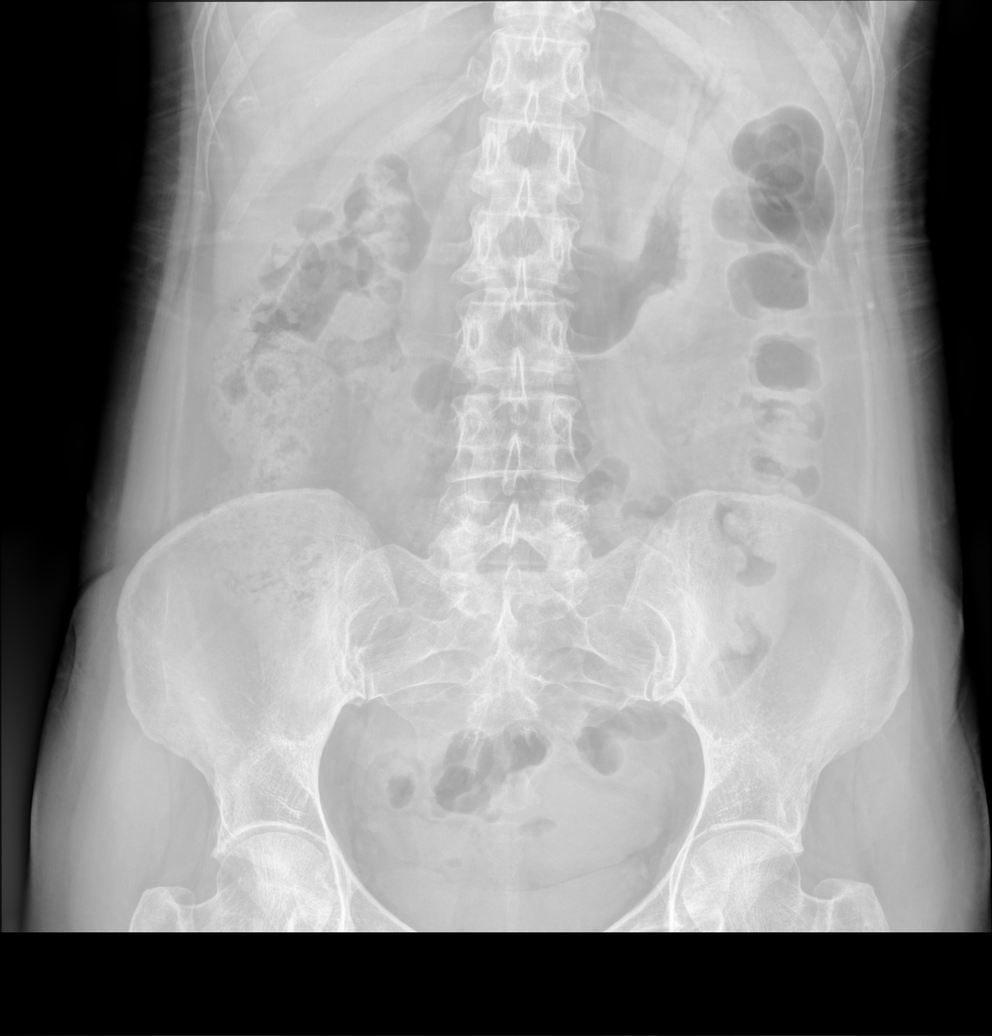

[lumbar spine oblique (1 of 2)]
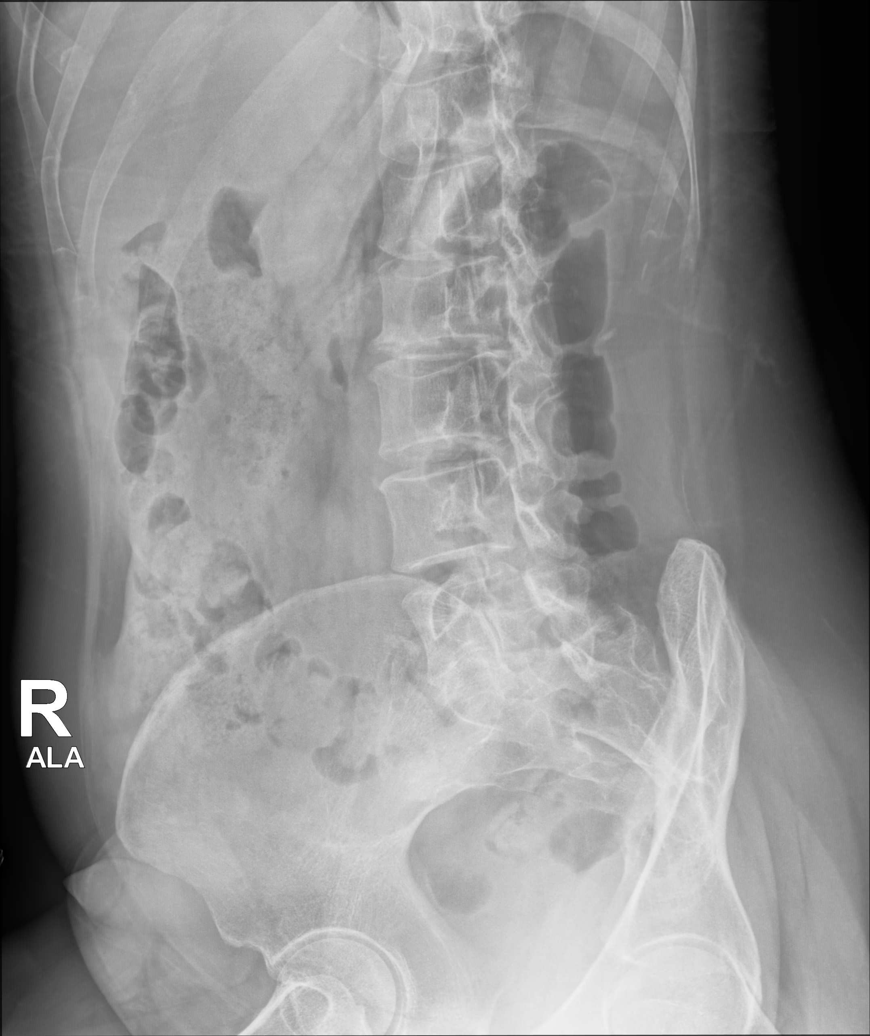

[lumbar spine oblique (2 of 2)]
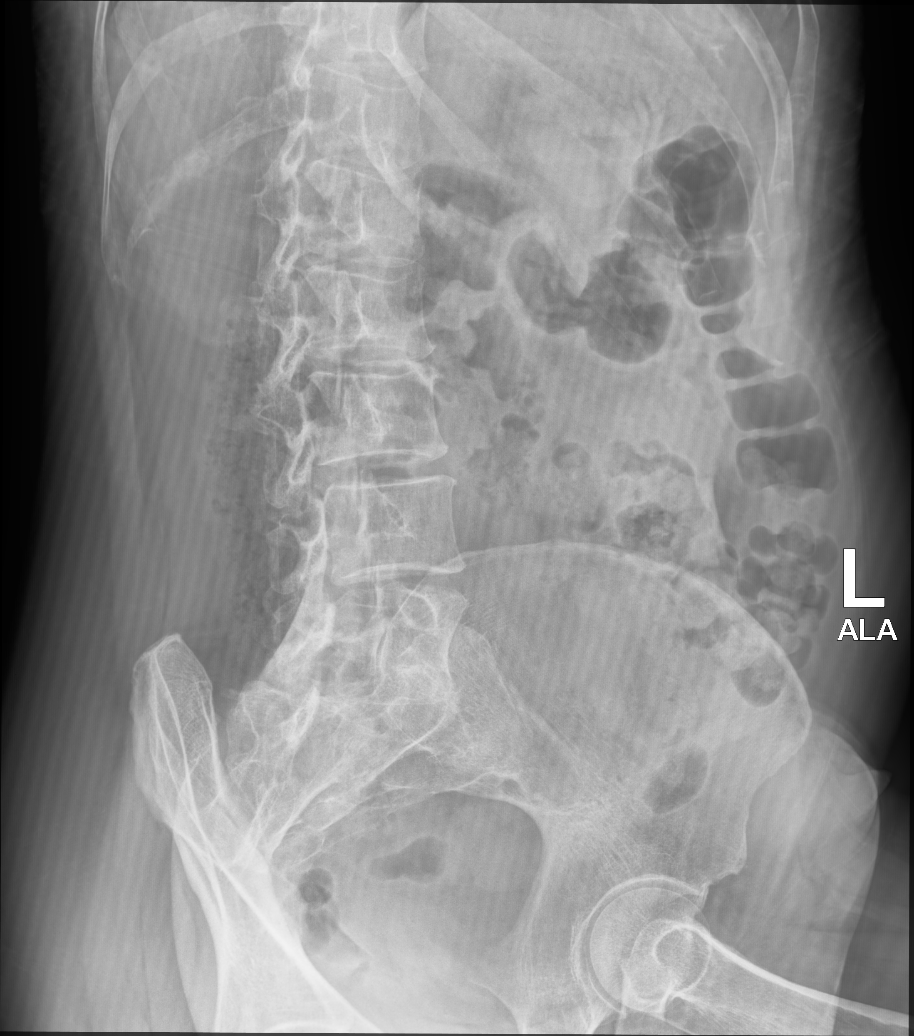

[lumbar spine lat (1 of 2)]
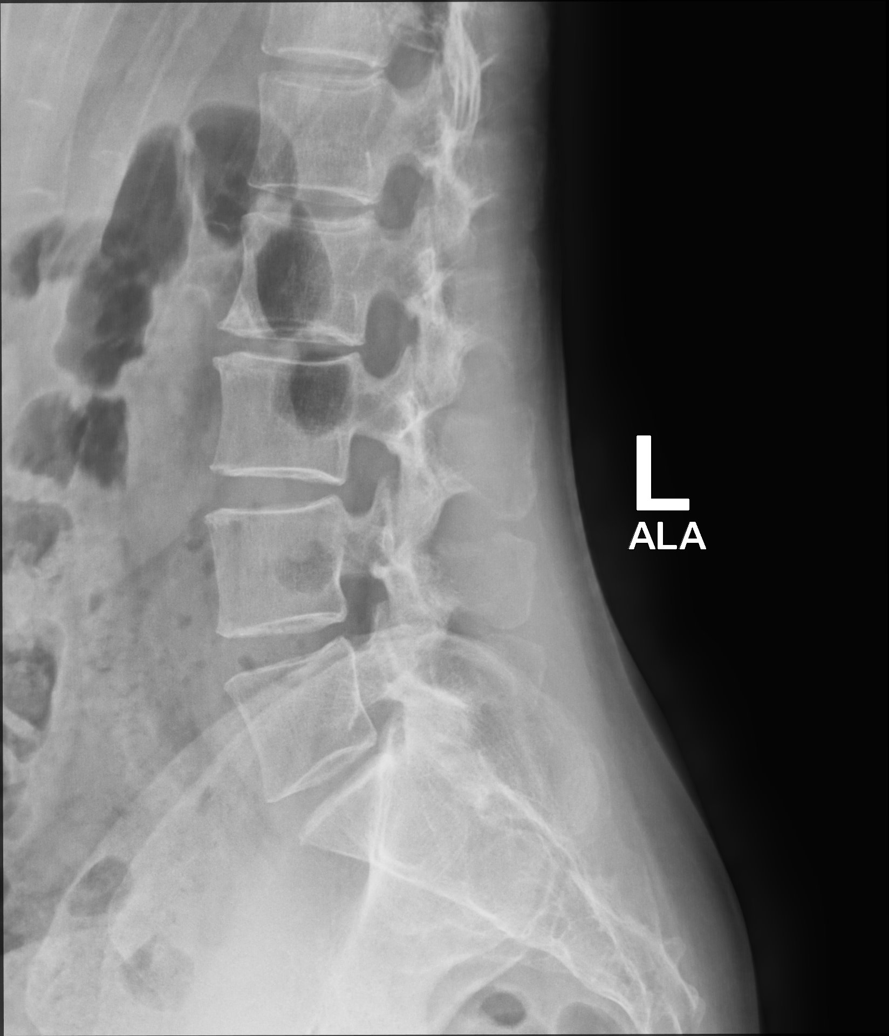

[lumbar spine lat (2 of 2)]
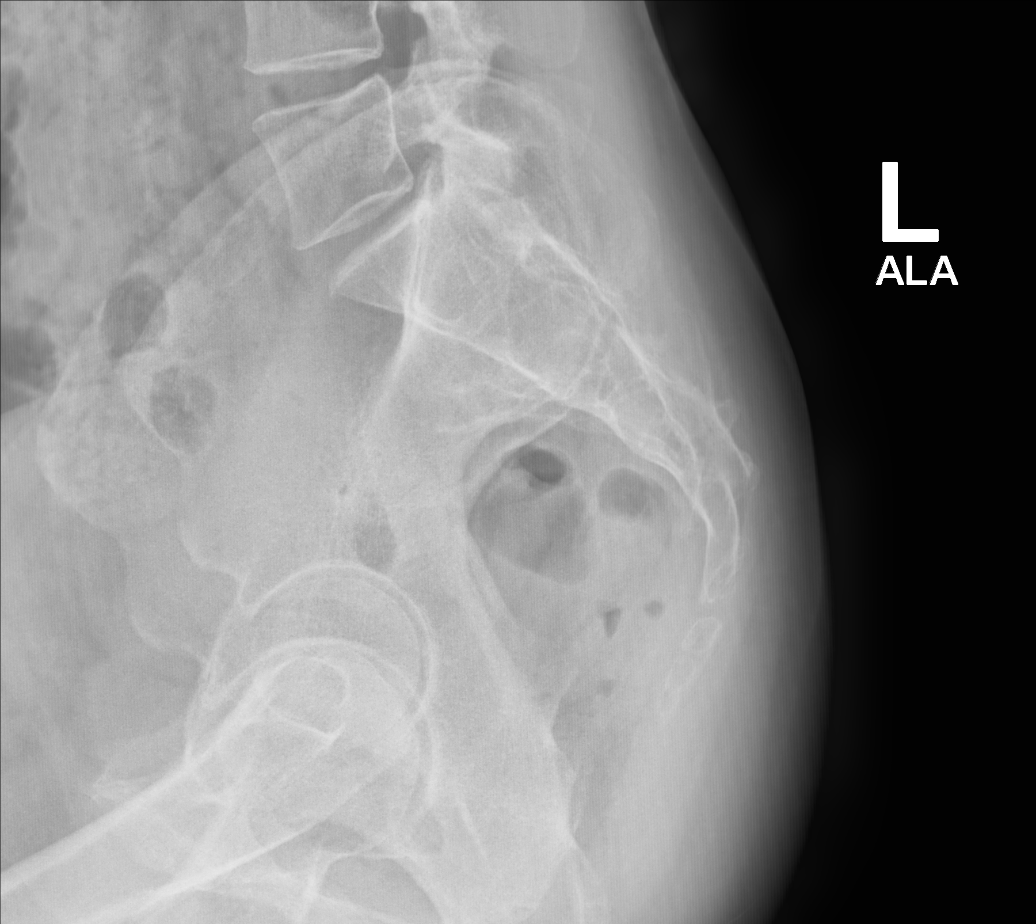

[5 of 5 positions shown; findings below may reference images not displayed]

FINDINGS: Normal lumbar lordosis. No acute fracture or listhesis of the lumbar
spine. Vertebral body height has been preserved. Mild intervertebral
disc space narrowing and endplate remodeling at L2-3 is in keeping
with mild degenerative disc disease. Remaining intervertebral disc
spaces are preserved. Oblique views demonstrate no evidence of pars
defect. Paraspinal soft tissues are unremarkable.
IMPRESSION: Mild degenerative disc disease L2-3.

## 2022-05-09 ENCOUNTER — Encounter: Payer: Managed Care, Other (non HMO) | Admitting: Internal Medicine

## 2022-05-14 ENCOUNTER — Other Ambulatory Visit: Payer: Self-pay | Admitting: Hematology and Oncology

## 2022-06-08 ENCOUNTER — Encounter: Payer: Managed Care, Other (non HMO) | Admitting: Internal Medicine

## 2022-07-13 ENCOUNTER — Encounter: Payer: Managed Care, Other (non HMO) | Admitting: Internal Medicine

## 2022-08-02 ENCOUNTER — Ambulatory Visit (INDEPENDENT_AMBULATORY_CARE_PROVIDER_SITE_OTHER): Payer: Managed Care, Other (non HMO) | Admitting: Internal Medicine

## 2022-08-02 ENCOUNTER — Encounter: Payer: Self-pay | Admitting: Internal Medicine

## 2022-08-02 VITALS — BP 120/64 | HR 85 | Temp 98.1°F | Ht 62.5 in | Wt 114.4 lb

## 2022-08-02 DIAGNOSIS — Z9013 Acquired absence of bilateral breasts and nipples: Secondary | ICD-10-CM

## 2022-08-02 DIAGNOSIS — Z8632 Personal history of gestational diabetes: Secondary | ICD-10-CM | POA: Diagnosis not present

## 2022-08-02 DIAGNOSIS — C50211 Malignant neoplasm of upper-inner quadrant of right female breast: Secondary | ICD-10-CM | POA: Diagnosis not present

## 2022-08-02 DIAGNOSIS — Z170 Estrogen receptor positive status [ER+]: Secondary | ICD-10-CM

## 2022-08-02 DIAGNOSIS — Z0000 Encounter for general adult medical examination without abnormal findings: Secondary | ICD-10-CM

## 2022-08-02 NOTE — Progress Notes (Signed)
Chief Complaint  Patient presents with   Annual Exam    HPI: Patient  Megan Armstrong  57 y.o. comes in today for Preventive Health Care visit   Back still problem tight if standing too much  . And cross  legged .  And has had pt  helped and sx not progressing lo otherwise localized  Oncology 2 x per year.  Has bilateral mastectomy  tolerating  Period stopped since OCt  23  so far.  On tamoxifen ? When dexa? Had appt with gyne soon  Health Maintenance  Topic Date Due   COVID-19 Vaccine (6 - 2023-24 season) 08/18/2022 (Originally 12/09/2021)   Zoster Vaccines- Shingrix (2 of 2) 11/01/2022 (Originally 06/20/2021)   HIV Screening  08/02/2023 (Originally 10/30/1980)   INFLUENZA VACCINE  11/09/2022   PAP SMEAR-Modifier  06/29/2024   DTaP/Tdap/Td (6 - Td or Tdap) 12/29/2030   COLONOSCOPY (Pts 45-60yrs Insurance coverage will need to be confirmed)  02/21/2032   Hepatitis C Screening  Completed   HPV VACCINES  Aged Out   Health Maintenance Review LIFESTYLE:  Exercise:  back   exercises 4 x per week.  Tobacco/ETS: no Alcohol:  no Sugar beverages: one a week  Sleep: about  6-7  Drug use: no HH of 2  Work:   40    ROS:  GEN/ HEENT: No fever, significant weight changes sweats headaches vision problems hearing changes, CV/ PULM; No chest pain shortness of breath cough, syncope,edema  change in exercise tolerance. GI /GU: No adominal pain, vomiting, change in bowel habits. No blood in the stool. No significant GU symptoms. SKIN/HEME: ,no acute skin rashes suspicious lesions or bleeding. No lymphadenopathy, nodules, masses.  NEURO/ PSYCH:  No neurologic signs such as weakness numbness. No depression anxiety. IMM/ Allergy: No unusual infections.  Allergy .   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    Past Surgical History:  Procedure Laterality Date   DIAGNOSTIC LAPAROSCOPY     MASTECTOMY W/ SENTINEL NODE BIOPSY Bilateral 09/16/2018   Procedure: RIGHT MASTECTOMY WITH RIGHT AXILLARY SENTINEL LYMPH NODE BIOPSY AND LEFT RISK REDUCING MASTECTOMY;  Surgeon: Emelia Loron, MD;  Location: [facility];  Service: General;  Laterality: Bilateral;    Family History  Problem Relation Age of Onset   Migraines Sister    Asthma Other    Diabetes Other    Hypertension Other    Sudden death Other    Breast cancer Cousin 50       mat first cousin, BRCA pos    Social History   Socioeconomic History   Marital status: Married    Spouse name: Not on file   Number of children: Not on file   Years of education: Not on file   Highest education level: Not on file  Occupational History   Not on file  Tobacco Use   Smoking status: Never   Smokeless tobacco: Never  Vaping Use   Vaping Use: Never used  Substance and Sexual Activity   Alcohol use: No  Drug use: No   Sexual activity: Not on file  Other Topics Concern   Not on file  Social History Narrative   Borderline spirometry   Married   Regular exercise   decff tea, some coke   2 children at home hh of 4    Pharmacy school internship   Family form [place]   Social Determinants of Health   Financial Resource Strain: Low Risk  (08/02/2022)   Overall Financial Resource Strain (CARDIA)    Difficulty of Paying Living Expenses: Not hard at all  Food Insecurity: No Food Insecurity (08/02/2022)   Hunger Vital Sign    Worried About Running Out of Food in the Last Year: Never true    Ran Out of Food in the Last Year: Never true  Transportation Needs: No Transportation Needs (08/02/2022)   PRAPARE - Scientist, research (physical sciences) (Medical): No    Lack of Transportation (Non-Medical): No  Physical Activity: Insufficiently Active (08/02/2022)   Exercise Vital Sign    Days of Exercise per Week: 4 days    Minutes of Exercise per Session: 30 min  Stress: No Stress Concern Present (08/02/2022)   [employer] of Occupational Health - Occupational Stress Questionnaire    Feeling of Stress : Not at all  Social Connections: Socially Integrated (08/02/2022)   Social Connection and Isolation Panel [NHANES]    Frequency of Communication with Friends and Family: More than three times a week    Frequency of Social Gatherings with Friends and Family: Once a week    Attends Religious Services: More than 4 times per year    Active Member of [employer] or Organizations: Yes    Attends Engineer, structural: More than 4 times per year    Marital Status: Married    Outpatient Medications Prior to Visit  Medication Sig Dispense Refill   CLENPIQ 10-3.5-12 MG-GM -GM/175ML SOLN Take by mouth.     Multiple Vitamins-Minerals (CENTRUM ULTRA WOMENS PO) Take by mouth.     Pediatric Multivitamins-Fl (MULTIVITAMIN + FLUORIDE) 0.25 MG CHEW Multivitamin     tamoxifen (NOLVADEX) 20 MG tablet TAKE 1 TABLET(20 MG) BY MOUTH DAILY 90 tablet 3   vitamin C (ASCORBIC ACID) 250 MG tablet Take 250 mg by mouth daily.     No facility-administered medications prior to visit.     EXAM:  BP 120/64 (BP Location: Right Arm, Patient Position: Sitting, Cuff Size: Normal)   Pulse 85   Temp 98.1 F (36.7 C) (Oral)   Ht 5' 2.5" (1.588 m)   Wt 114 lb 6.4 oz (51.9 kg)   SpO2 96%   BMI 20.59 kg/m   Body mass index is 20.59 kg/m. Wt Readings from Last 3 Encounters:  08/02/22 114 lb 6.4 oz (51.9 kg)  03/28/22 111 lb 1.6 oz (50.4 kg)  12/13/21 114 lb 1.6 oz (51.8 kg)    Physical Exam: Vital signs reviewed ZOX:WRUE is a well-developed well-nourished alert cooperative    who appearsr stated age in no acute distress.  HEENT:  normocephalic atraumatic , Eyes: PERRL EOM's full, conjunctiva clear, Nares: paten,t no deformity discharge or tenderness., Ears: no deformity EAC's clear TMs with normal landmarks. Mouth: clear OP, no lesions, edema.  Moist mucous membranes. Dentition in adequate repair. NECK: supple without masses, thyromegaly or bruits. CHEST/PULM:  Clear to auscultation and percussion breath sounds equal no wheeze , rales or rhonchi. No chest wall deformities or tenderness. Breast: absent  no masses  . CV:  PMI is nondisplaced, S1 S2 no gallops, murmurs, rubs. Peripheral pulses are full without delay. ABDOMEN: Bowel sounds normal nontender  No guard or rebound, no hepato splenomegal no CVA tenderness.  Extremtities:  No clubbing cyanosis or edema, no acute joint swelling or redness no focal atrophy NEURO:  Oriented x3, cranial nerves 3-12 appear to be intact, no obvious focal weakness,gait within normal limits no abnormal reflexes or asymmetrical SKIN: No acute rashes normal turgor, color, no bruising or petechiae. PSYCH: Oriented, good eye contact, no obvious depression anxiety, cognition and judgment appear normal. LN: no cervical axillary i adenopathy  Lab Results  Component Value Date   WBC 5.8 04/25/2021   HGB 12.6 04/25/2021   HCT 38.8 04/25/2021   PLT 273.0 04/25/2021   GLUCOSE 91 04/25/2021   CHOL 196 04/25/2021   TRIG 221.0 (H) 04/25/2021   HDL 41.50 04/25/2021   LDLDIRECT 111.0 04/25/2021   LDLCALC 90 01/08/2020   ALT 11 04/25/2021   AST 14 04/25/2021   NA 139 04/25/2021   K 4.4 04/25/2021   CL 106 04/25/2021   CREATININE 0.78 04/25/2021   BUN 11 04/25/2021   CO2 27 04/25/2021   TSH 2.50 04/25/2021   HGBA1C 5.8 04/25/2021    BP Readings from Last 3 Encounters:  08/02/22 120/64  03/28/22 128/77  12/13/21 112/62    Labplan   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 Visit for preventive health examination  Z00.00 TSH    CANCELED: TSH    2. Hx gestational diabetes  Z86.32 TSH    CANCELED: TSH    3. Malignant neoplasm of upper-inner quadrant of right breast in female, estrogen receptor positive  C50.211    Z17.0    on tamoxifen therapy    4. H/O bilateral mastectomy  Z90.13     Disc dexa at baseline and fu as indicated  Agree with open pt  if needed.  If back pain progressive  or alarm sx then fu with eval mri etc . Sounds mechanical and not alarming .  Is utd on colon per dr Loreta Ave Return in about 1 year (around 08/02/2023) for depending on results.  Patient Care Team: Hamda Klutts, Neta Mends, MD as PCP - General Olivia Mackie, MD as Consulting Physician (Obstetrics and Gynecology) Charna Elizabeth, MD as Consulting Physician (Gastroenterology) Serena Croissant, MD as Consulting Physician (Hematology and Oncology) Glenna Fellows, MD as Consulting Physician (Plastic Surgery) Emelia Loron, MD as Consulting Physician (General Surgery) Patient Instructions  Lab   Yearly check   Fu back if needed as discussed  Disc dexa with gyne when appropriate   Neta Mends. Tyerra Loretto M.D.

## 2022-08-02 NOTE — Patient Instructions (Addendum)
Lab   Yearly check   Fu back if needed as discussed  Disc dexa with gyne when appropriate

## 2022-08-18 LAB — HM PAP SMEAR

## 2022-08-31 NOTE — Progress Notes (Signed)
[facility] historical records have been scanned into chart.

## 2022-09-05 ENCOUNTER — Telehealth: Payer: Self-pay | Admitting: Internal Medicine

## 2022-09-05 NOTE — Telephone Encounter (Signed)
Pt called and stated that she had lab work done on 4/24 at [facility] and wanted a call back to discuss the results.   Pt also stated that her daughter Hetvi Oldenkamp MRN [id number] received a no show fee for missing her mychart appt and believe that it should be waved since she has been a pt of Dr. Fabian Sharp for years. Would like to discuss that with the Dr as well.   Please advise.

## 2022-09-06 LAB — LAB REPORT - SCANNED
A1c: 5.5
EGFR: 86

## 2022-09-07 NOTE — Telephone Encounter (Signed)
2nd attempt Left a voicemail

## 2022-09-07 NOTE — Telephone Encounter (Signed)
Please have the  fee waived for her daughter . And lab review pending

## 2022-09-07 NOTE — Telephone Encounter (Signed)
Attempted to reach pt. Left a voicemail to call us back.  

## 2022-09-11 ENCOUNTER — Other Ambulatory Visit: Payer: Self-pay | Admitting: Internal Medicine

## 2022-09-11 NOTE — Telephone Encounter (Signed)
I see the labs and noted elevated cholesterol level can discuss at visit

## 2022-09-12 ENCOUNTER — Telehealth (INDEPENDENT_AMBULATORY_CARE_PROVIDER_SITE_OTHER): Payer: Managed Care, Other (non HMO) | Admitting: Internal Medicine

## 2022-09-12 ENCOUNTER — Encounter: Payer: Self-pay | Admitting: Internal Medicine

## 2022-09-12 VITALS — Ht 62.5 in | Wt 117.0 lb

## 2022-09-12 DIAGNOSIS — Z8249 Family history of ischemic heart disease and other diseases of the circulatory system: Secondary | ICD-10-CM

## 2022-09-12 DIAGNOSIS — E782 Mixed hyperlipidemia: Secondary | ICD-10-CM | POA: Diagnosis not present

## 2022-09-12 NOTE — Telephone Encounter (Signed)
Pt has video visit with provider today.

## 2022-09-12 NOTE — Progress Notes (Signed)
Virtual Visit via Video Note  I connected with Megan Armstrong on 09/12/22 at  3:45 PM EDT by a video enabled telemedicine application and verified that I am speaking with the correct person using two identifiers. Location patient: home Location provider:work office Persons participating in the virtual visit: patient, provider  Patient aware  of the limitations of evaluation and management by telemedicine and  availability of in person appointments. and agreed to proceed.   HPI: Megan Armstrong presents for video visit  to review blood work results  see scanned document   LP showed  TC 230 hdl l 56 ldl m152   tg 108 A1c 5.5 Cr 0.8 Cbc cmp nl    ROS: See pertinent positives and negatives per HPI. On tamoxifen no hx of radiation Fathers side older age dementia and dm  Moms side she died young oif asthma  mat uncle Mi in 22s  cva in grandparent  Past Medical History:  Diagnosis Date   Allergic rhinitis    Endometrial hyperplasia    Endometriosis    Family history of breast cancer    Family history of breast cancer    Hx of endometriosis    rectal vag with laser surgery    Seasonal allergies    TB skin/subcutaneous    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    Past Surgical History:  Procedure Laterality Date   DIAGNOSTIC LAPAROSCOPY     MASTECTOMY W/ SENTINEL NODE BIOPSY Bilateral 09/16/2018   Procedure: RIGHT MASTECTOMY WITH RIGHT AXILLARY SENTINEL LYMPH NODE BIOPSY AND LEFT RISK REDUCING MASTECTOMY;  Surgeon: Emelia Loron, MD;  Location: [facility];  Service: General;  Laterality: Bilateral;    Family History  Problem Relation Age of Onset   Migraines Sister    Asthma Other    Diabetes Other    Hypertension Other    Sudden death Other    Breast cancer Cousin 50       mat first cousin, BRCA pos    Social History   Tobacco Use   Smoking status: Never   Smokeless tobacco: Never  Vaping Use   Vaping Use: Never used   Substance Use Topics   Alcohol use: No   Drug use: No      Current Outpatient Medications:    Multiple Vitamins-Minerals (CENTRUM ULTRA WOMENS PO), Take by mouth., Disp: , Rfl:    Oxymetazoline HCl ([employer] NASAL SPRAY NA), Place into the nose as needed., Disp: , Rfl:    tamoxifen (NOLVADEX) 20 MG tablet, TAKE 1 TABLET(20 MG) BY MOUTH DAILY, Disp: 90 tablet, Rfl: 3   vitamin C (ASCORBIC ACID) 250 MG tablet, Take 250 mg by mouth daily., Disp: , Rfl:   EXAM: BP Readings from Last 3 Encounters:  08/02/22 120/64  03/28/22 128/77  12/13/21 112/62    VITALS per patient if applicable:  GENERAL: alert, oriented, appears well and in no acute distress  HEENT: atraumatic, conjunttiva clear, no obvious abnormalities on inspection of external nose and ears  NECK: normal movements of the head and neck  LUNGS: on inspection no signs of respiratory distress, breathing rate appears normal, no obvious gross SOB, gasping or wheezing  CV: no obvious cyanosis  PSYCH/NEURO: pleasant and cooperative, no obvious depression or anxiety, speech and thought processing grossly intact Lab Results  Component Value Date   WBC 5.8 04/25/2021   HGB 12.6 04/25/2021   HCT 38.8 04/25/2021   PLT 273.0 04/25/2021   GLUCOSE  91 04/25/2021   CHOL 196 04/25/2021   TRIG 221.0 (H) 04/25/2021   HDL 41.50 04/25/2021   LDLDIRECT 111.0 04/25/2021   LDLCALC 90 01/08/2020   ALT 11 04/25/2021   AST 14 04/25/2021   NA 139 04/25/2021   K 4.4 04/25/2021   CL 106 04/25/2021   CREATININE 0.78 04/25/2021   BUN 11 04/25/2021   CO2 27 04/25/2021   TSH 2.50 04/25/2021   HGBA1C 5.8 04/25/2021    ASSESSMENT AND PLAN:  Discussed the following assessment and plan:    ICD-10-CM   1. Mixed hyperlipidemia  E78.2 CT CARDIAC SCORING (SELF PAY ONLY)    2. Family history of cardiovascular disorder  Z82.49 CT CARDIAC SCORING (SELF PAY ONLY)    Disc risk assessment  no sx  Maternal uncle mi in 30s  Dic ct calcium score  to  better assess future risk  and risk benefit of medicine intervention ( statin meds and goals)  Has hx of gest dm but no evidence of dm   Counseled.   Expectant management and discussion of plan and treatment with opportunity to ask questions and all were answered. The patient agreed with the plan and demonstrated an understanding of the instructions.   Advised to call back or seek an in-person evaluation if worsening  or having  further concerns  in interim. Return for depending on results  calcium score.    Berniece Andreas, MD

## 2022-09-15 NOTE — Telephone Encounter (Signed)
No show has been waived for this one time but in the future we are unable to do so again.

## 2022-10-09 ENCOUNTER — Ambulatory Visit (HOSPITAL_BASED_OUTPATIENT_CLINIC_OR_DEPARTMENT_OTHER): Payer: Managed Care, Other (non HMO)

## 2022-11-17 ENCOUNTER — Ambulatory Visit (HOSPITAL_BASED_OUTPATIENT_CLINIC_OR_DEPARTMENT_OTHER)
Admission: RE | Admit: 2022-11-17 | Discharge: 2022-11-17 | Disposition: A | Discharge status: Home / Self Care | Payer: Managed Care, Other (non HMO) | Source: Ambulatory Visit | Attending: Internal Medicine | Admitting: Internal Medicine

## 2022-11-17 DIAGNOSIS — Z8249 Family history of ischemic heart disease and other diseases of the circulatory system: Secondary | ICD-10-CM | POA: Insufficient documentation

## 2022-11-17 DIAGNOSIS — E782 Mixed hyperlipidemia: Secondary | ICD-10-CM

## 2022-11-21 NOTE — Progress Notes (Signed)
Ct calcium score is 0 . Reassuring

## 2022-11-23 ENCOUNTER — Telehealth: Payer: Self-pay | Admitting: Internal Medicine

## 2022-11-23 NOTE — Telephone Encounter (Signed)
Pt states she has a missed call.  I could not locate a phone note.  Pt is asking that whoever called her, please try again.

## 2022-11-24 NOTE — Telephone Encounter (Signed)
Spoke to pt. See lab result.

## 2023-02-15 ENCOUNTER — Telehealth: Payer: Self-pay | Admitting: Internal Medicine

## 2023-02-15 NOTE — Telephone Encounter (Signed)
Patient dropped off document  Wellness Screening Form , to be filled out by provider. Patient requested to send it back via Fax and call pt within 5-days. Document is located in providers tray at front office.Please advise at Mobile [phone] (mobile)

## 2023-02-22 NOTE — Telephone Encounter (Signed)
Singed  no charge  ( labs results scanned in system from 4 24 )

## 2023-02-23 NOTE — Telephone Encounter (Signed)
Spoke to pt. Inform pt the form needs her signature and wasn't sure if okay to fax it over.   Pt states no need to fax it over. She will pick it up this afternoon.   Form is ready for pt to pick up at the front desk.

## 2023-03-02 ENCOUNTER — Ambulatory Visit (INDEPENDENT_AMBULATORY_CARE_PROVIDER_SITE_OTHER): Payer: Managed Care, Other (non HMO) | Admitting: Family Medicine

## 2023-03-02 ENCOUNTER — Ambulatory Visit: Payer: Managed Care, Other (non HMO)

## 2023-03-02 ENCOUNTER — Encounter: Payer: Self-pay | Admitting: Family Medicine

## 2023-03-02 VITALS — BP 140/78 | HR 80 | Temp 97.5°F | Ht 62.5 in | Wt 120.0 lb

## 2023-03-02 DIAGNOSIS — M549 Dorsalgia, unspecified: Secondary | ICD-10-CM | POA: Diagnosis not present

## 2023-03-02 DIAGNOSIS — M546 Pain in thoracic spine: Secondary | ICD-10-CM | POA: Diagnosis not present

## 2023-03-02 MED ORDER — TRAMADOL HCL 50 MG PO TABS: 50.0000 mg | ORAL_TABLET | Freq: Four times a day (QID) | ORAL | 0 refills | Status: AC | PRN

## 2023-03-02 NOTE — Patient Instructions (Addendum)
Continue with the topical heat and topical salon pas patch.    I am setting up PT and sending in some Tramadol.

## 2023-03-02 NOTE — Progress Notes (Signed)
Established Patient Office Visit  Subjective   Patient ID: Megan Armstrong, female    DOB: 1965-11-29  Age: 57 y.o. MRN: [id number]  Chief Complaint  Patient presents with   Shoulder Pain    Patient complains of right shoulder pain, x1.5 weeks, Tried Ibuprofen    HPI   Seen today with onset about a week and a half ago of right periscapular pain.  This did follow lifting some heavy objects but she does not recall specific injury.  She then had hysteroscopy last week and thinks this may have exacerbated this pain possibly due to position she was in with her procedure.  Her pain is right periscapular region.  No neck pain.  No shoulder pain.  Denies any cough, fever, dyspnea, or pleuritic pain.  She tried yoga exercises, ibuprofen, heat, and topical sports cream type ointment without much improvement.  Pain has been worsening.  Does seem to be worse with change of position and movement.  She has past medical history significant for breast cancer and is on chronic tamoxifen.  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 She has never used smokeless tobacco. She reports that she does not drink alcohol and does not use drugs. family history includes Asthma in an other family member; Breast cancer  (age of onset: 23) in her cousin; Diabetes in an other family member; Hypertension in an other family member; Migraines in her sister; Sudden death in an other family member. Allergies  Allergen Reactions   Codeine    Penicillins     REACTION: rash and swelling to amoxicillin   Clindamycin Rash   Clindamycin/Lincomycin Rash   Keflex [Cephalexin] Rash   Penicillin G Rash  \ Review of Systems  Constitutional:  Negative for chills and fever.  Respiratory:  Negative for cough, hemoptysis, sputum production and shortness of breath.   Cardiovascular:  Negative for chest pain and leg swelling.  Musculoskeletal:  Positive for back pain.      Objective:     BP (!) 140/78 (BP Location: Left Arm, Patient Position: Sitting, Cuff Size: Normal)   Pulse 80   Temp (!) 97.5 F (36.4 C) (Oral)   Ht 5' 2.5" (1.588 m)   Wt 120 lb (54.4 kg)   SpO2 98%   BMI 21.60 kg/m  BP Readings from Last 3 Encounters:  03/02/23 (!) 140/78  08/02/22 120/64  03/28/22 128/77   Wt Readings from Last 3 Encounters:  03/02/23 120 lb (54.4 kg)  09/12/22 117 lb (53.1 kg)  08/02/22 114 lb 6.4 oz (51.9 kg)      Physical Exam Vitals reviewed.  Constitutional:      General: She is not in acute distress.    Appearance: She  is not ill-appearing.  Cardiovascular:     Rate and Rhythm: Normal rate and regular rhythm.  Pulmonary:     Effort: Pulmonary effort is normal.     Breath sounds: Normal breath sounds. No wheezing or rales.  Musculoskeletal:     Comments: Full range of motion right shoulder.  She has some tenderness to palpation around the right scapular region.  No masses palpated.  Skin:    Findings: No rash.  Neurological:     Mental Status: She is alert.      No results found for any visits on 03/02/23.    The 10-year ASCVD risk score (Arnett DK, et al., 2019) is: 3.6%    Assessment & Plan:   Problem List Items Addressed This Visit   None Visit Diagnoses     Upper back pain on right  side    -  Primary   Relevant Orders   DG Chest 2 View     1-1/2-week history of progressive right periscapular pain.  Suspect musculoskeletal.  Pain worse with position change and movement.  Some localized tenderness to palpation.  Low clinical suspicion for PE or other pulmonary source.    -Obtain PA and lateral chest x-ray-reviewed with patient.  No obvious acute abnormalities.  This will be over read by radiology -Consider topical patch such as Salonpas -Wrote for limited tramadol 50 mg 1 every 6 hours as needed for severe pain -Set up physical therapy which has benefited her greatly in the past -May continue supplementation with ibuprofen as needed  No follow-ups on file.    Evelena Peat, MD

## 2023-03-20 NOTE — Therapy (Unsigned)
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   Past Surgical History:  Procedure Laterality Date   DIAGNOSTIC LAPAROSCOPY     MASTECTOMY W/ SENTINEL NODE BIOPSY Bilateral 09/16/2018   Procedure: RIGHT MASTECTOMY WITH RIGHT AXILLARY SENTINEL LYMPH NODE BIOPSY AND LEFT RISK REDUCING MASTECTOMY;  Surgeon: Emelia Loron, MD;  Location: [facility];  Service: General;  Laterality: Bilateral;   Patient Active Problem List   Diagnosis Date Noted   Family history of cardiovascular disorder 09/12/2022   Breast cancer, right (HCC) 09/16/2018   Genetic testing 09/09/2018   Malignant neoplasm of upper-inner quadrant of right breast in female, estrogen receptor positive (HCC) 08/19/2018   Family history of breast cancer    History of penicillin allergy 09/24/2012   Exposure to strep throat 09/24/2012   Perioral dermatitis 09/24/2012   Left otitis media with effusion 08/16/2011   Hx of antibiotic allergy 05/03/2011   Disorder of teeth and supporting structures 03/30/2008   Allergic rhinitis 02/17/2008   Endometriosis 02/17/2008   Headache 02/17/2008   Hx gestational diabetes 02/17/2008   POSITIVE PPD 12/17/2007    PCP: Madelin Headings, MD   REFERRING PROVIDER: Kristian Covey, MD  REFERRING DIAG: M54.9 (ICD-10-CM) - Upper back pain on right side  Rationale for Evaluation and Treatment: Rehabilitation  THERAPY DIAG:  No diagnosis found.  ONSET DATE: ***  SUBJECTIVE:                                                                                                                                                                                            SUBJECTIVE STATEMENT: ***  PERTINENT HISTORY:  Seen today with onset about a week and a half ago of right periscapular pain.  This did follow lifting some heavy objects but she does not recall specific injury.  She then had hysteroscopy last week and thinks this may have exacerbated this pain possibly due to position she was in with her procedure.  Her pain is right periscapular region.  No neck pain.  No shoulder pain.  Denies any cough, fever, dyspnea, or  pleuritic pain.   She tried yoga exercises, ibuprofen, heat, and topical sports cream type ointment without much improvement.  Pain has been worsening.  Does seem to be worse with change of position and movement.   She has past medical history significant for breast cancer and is on chronic tamoxifen.  PAIN:  Are you having pain? {OPRCPAIN:27236}  PRECAUTIONS: Other: CA  RED FLAGS: None   WEIGHT BEARING RESTRICTIONS: No  FALLS:  Has patient fallen in last 6 months? No  OCCUPATION: ***  PLOF: Independent  PATIENT GOALS: ***  NEXT MD VISIT: ***  OBJECTIVE:  Note: Objective measures were completed at Evaluation unless otherwise noted.  DIAGNOSTIC FINDINGS:  none  PATIENT SURVEYS:  FOTO ***  MUSCLE LENGTH: Hamstrings: Right *** deg; Left *** deg Maisie Fus test: Right *** deg; Left *** deg  POSTURE: {posture:25561}  PALPATION: ***  LUMBAR ROM:   AROM eval  Flexion   Extension   Right lateral flexion   Left lateral flexion   Right rotation   Left rotation    (Blank rows = not tested)  LOWER EXTREMITY ROM:     {AROM/PROM:27142}  Right eval Left eval  Hip flexion    Hip extension    Hip abduction    Hip adduction    Hip internal rotation    Hip external rotation    Knee flexion    Knee extension    Ankle dorsiflexion    Ankle  plantarflexion    Ankle inversion    Ankle eversion     (Blank rows = not tested)  LOWER EXTREMITY MMT:    MMT Right eval Left eval  Hip flexion    Hip extension    Hip abduction    Hip adduction    Hip internal rotation    Hip external rotation    Knee flexion    Knee extension    Ankle dorsiflexion    Ankle plantarflexion    Ankle inversion    Ankle eversion     (Blank rows = not tested)  LUMBAR SPECIAL TESTS:  {lumbar special test:25242}  FUNCTIONAL TESTS:  {Functional tests:24029}  GAIT: Distance walked: *** Assistive device utilized: {Assistive devices:23999} Level of assistance: {Levels of assistance:24026} Comments: ***  TODAY'S TREATMENT:                                                                                                                              DATE: ***    PATIENT EDUCATION:  Education details: *** Person educated: {Person educated:25204} Education method: {Education Method:25205} Education comprehension: {Education Comprehension:25206}  HOME EXERCISE PROGRAM: ***  ASSESSMENT:  CLINICAL IMPRESSION: Patient is a *** y.o. *** who was seen today for physical therapy evaluation and treatment for ***.   OBJECTIVE IMPAIRMENTS: {opptimpairments:25111}.   ACTIVITY LIMITATIONS: {activitylimitations:27494}  PARTICIPATION LIMITATIONS: {participationrestrictions:25113}  PERSONAL FACTORS: {Personal factors:25162} are also affecting patient's functional outcome.   REHAB POTENTIAL: {rehabpotential:25112}  CLINICAL DECISION MAKING: {clinical decision making:25114}  EVALUATION COMPLEXITY: {Evaluation complexity:25115}   GOALS: Goals reviewed with patient? {yes/no:20286}  SHORT TERM GOALS: Target date: ***  *** Baseline: Goal status: INITIAL  2.  *** Baseline:  Goal status: INITIAL  3.  *** Baseline:  Goal status: INITIAL  4.  *** Baseline:  Goal status: INITIAL  5.  *** Baseline:  Goal status: INITIAL  6.   *** Baseline:  Goal status: INITIAL  LONG TERM GOALS: Target date: ***  *** Baseline:  Goal status: INITIAL  2.  *** Baseline:  Goal status: INITIAL  3.  *** Baseline:  Goal status: INITIAL  4.  *** Baseline:  Goal status: INITIAL  5.  *** Baseline:  Goal status: INITIAL  6.  *** Baseline:  Goal status: INITIAL  PLAN:  PT FREQUENCY: {rehab frequency:25116}  PT DURATION: {rehab duration:25117}  PLANNED INTERVENTIONS: {rehab planned interventions:25118::"97110-Therapeutic exercises","97530- Therapeutic [phone]- Neuromuscular re-education","97535- Self UYQI","34742- Manual therapy"}.  PLAN FOR NEXT SESSION: Hildred Laser, PT 03/20/2023, 6:03 PM

## 2023-03-21 ENCOUNTER — Other Ambulatory Visit: Payer: Self-pay

## 2023-03-21 ENCOUNTER — Ambulatory Visit: Payer: Managed Care, Other (non HMO) | Attending: Family Medicine

## 2023-03-21 DIAGNOSIS — R293 Abnormal posture: Secondary | ICD-10-CM | POA: Diagnosis present

## 2023-03-21 DIAGNOSIS — M6281 Muscle weakness (generalized): Secondary | ICD-10-CM | POA: Diagnosis present

## 2023-03-21 DIAGNOSIS — M549 Dorsalgia, unspecified: Secondary | ICD-10-CM | POA: Diagnosis not present

## 2023-03-21 DIAGNOSIS — M546 Pain in thoracic spine: Secondary | ICD-10-CM | POA: Diagnosis present

## 2023-03-21 NOTE — Therapy (Signed)
OUTPATIENT PHYSICAL THERAPY CERVICAL EVALUATION   Patient Name: Megan Armstrong MRN: [id number] DOB:10/18/1965, 57 y.o., female Today's Date: 03/21/2023  END OF SESSION:  PT End of Session - 03/21/23 1536     Visit Number 1    Number of Visits 8    Date for PT Re-Evaluation 05/22/23    Authorization Type cigna    PT Start Time 1400    PT Stop Time 1445    PT Time Calculation (min) 45 min    Activity Tolerance Patient tolerated treatment well    Behavior During Therapy WFL for tasks assessed/performed             Past Medical History:  Diagnosis Date   Allergic rhinitis    Endometrial hyperplasia    Endometriosis    Family history of breast cancer    Family history of breast cancer    Hx of endometriosis    rectal vag with laser surgery    Seasonal allergies    TB skin/subcutaneous    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   Past Surgical History:  Procedure Laterality Date   DIAGNOSTIC LAPAROSCOPY     MASTECTOMY W/ SENTINEL NODE BIOPSY Bilateral 09/16/2018   Procedure: RIGHT MASTECTOMY WITH RIGHT AXILLARY SENTINEL LYMPH NODE BIOPSY AND LEFT RISK REDUCING MASTECTOMY;  Surgeon: Emelia Loron, MD;  Location: [facility];  Service: General;  Laterality: Bilateral;   Patient Active Problem List   Diagnosis Date Noted   Family history of cardiovascular disorder 09/12/2022   Breast cancer, right (HCC) 09/16/2018   Genetic testing 09/09/2018   Malignant neoplasm of upper-inner quadrant of right breast in female, estrogen receptor positive (HCC) 08/19/2018   Family history of breast cancer    History of penicillin allergy 09/24/2012   Exposure to strep throat 09/24/2012   Perioral dermatitis 09/24/2012   Left otitis media with effusion 08/16/2011   Hx of antibiotic allergy 05/03/2011   Disorder of teeth and supporting structures 03/30/2008   Allergic rhinitis 02/17/2008   Endometriosis 02/17/2008   Headache 02/17/2008    Hx gestational diabetes 02/17/2008   POSITIVE PPD 12/17/2007    PCP: Madelin Headings, MD   REFERRING PROVIDER: Kristian Covey, MD  REFERRING DIAG: M54.9 (ICD-10-CM) - Upper back pain on right side  THERAPY DIAG:  Pain in thoracic spine  Abnormal posture  Muscle weakness (generalized)  Rationale for Evaluation and Treatment: Rehabilitation  ONSET DATE: 2 weeks  SUBJECTIVE:  SUBJECTIVE STATEMENT: Describes RUE discomfort and paresthesias, ongoing for 2 weeks now.  Has difficulty finding comfortable sleep positions.  Finds relief with yoga and stretching.  B mastectomies in 2020 Hand dominance: Right  PERTINENT HISTORY:  Seen today with onset about a week and a half ago of right periscapular pain.  This did follow lifting some heavy objects but she does not recall specific injury.  She then had hysteroscopy last week and thinks this may have exacerbated this pain possibly due to position she was in with her procedure.  Her pain is right periscapular region.  No neck pain.  No shoulder pain.  Denies any cough, fever, dyspnea, or pleuritic pain.   She tried yoga exercises, ibuprofen, heat, and topical sports cream type ointment without much improvement.  Pain has been worsening.  Does seem to be worse with change of position and movement.   She has past medical history significant for breast cancer and is on chronic tamoxifen.  PAIN:  Are you having pain? Yes: NPRS scale: 9/10 Pain location: R posterior shoulder  Pain description: ache Aggravating factors: prolonged positions, driving, desk work Relieving factors: heat, rest  PRECAUTIONS: None  RED FLAGS: None     WEIGHT BEARING RESTRICTIONS: No  FALLS:  Has patient fallen in last 6 months? No  OCCUPATION: office  work  PLOF: Independent  PATIENT GOALS: To manage my pain  NEXT MD VISIT: TBD  OBJECTIVE:  Note: Objective measures were completed at Evaluation unless otherwise noted.  DIAGNOSTIC FINDINGS:  IMPRESSION: 1. No acute cardiopulmonary disease. Specifically, no evidence of pneumonia. 2. Mild pectus deformity as demonstrated on cardiac CT performed 11/17/2022.     Electronically Signed   By: Simonne Come M.D.   On: 03/18/2023 16:34  PATIENT SURVEYS:  FOTO 37(66 predicted)  POSTURE: rounded shoulders, forward head, and increased thoracic kyphosis  PALPATION: TTP R thoracic paraspinals, teres major/minor    CERVICAL ROM: WNL throughout  Active ROM A/PROM (deg) eval  Flexion   Extension   Right lateral flexion   Left lateral flexion   Right rotation   Left rotation    (Blank rows = not tested)  UPPER EXTREMITY ROM: WNL throughout  Active ROM Right eval Left eval  Shoulder flexion    Shoulder extension    Shoulder abduction    Shoulder adduction    Shoulder extension    Shoulder internal rotation    Shoulder external rotation    Elbow flexion    Elbow extension    Wrist flexion    Wrist extension    Wrist ulnar deviation    Wrist radial deviation    Wrist pronation    Wrist supination     (Blank rows = not tested)  UPPER EXTREMITY MMT:  MMT Right eval Left eval  Shoulder flexion    Shoulder extension    Shoulder abduction    Shoulder adduction    Shoulder extension    Shoulder internal rotation    Shoulder external rotation    Middle trapezius    Lower trapezius    Elbow flexion    Elbow extension    Wrist flexion    Wrist extension    Wrist ulnar deviation    Wrist radial deviation    Wrist pronation    Wrist supination    Grip strength     (Blank rows = not tested)  CERVICAL SPECIAL TESTS:  Upper limb tension test (ULTT): Negative  FUNCTIONAL TESTS:  30 seconds chair stand test  n/a  TODAY'S TREATMENT:                                                                                                                               DATE: 03/21/23 Eval and  HEP  PATIENT EDUCATION:  Education details: Discussed eval findings, rehab rationale and POC and patient is in agreement  Person educated: Patient Education method: Explanation Education comprehension: verbalized understanding and needs further education  HOME EXERCISE PROGRAM: Access Code: [email or web address] URL: [email or web address] Date: 03/21/2023 Prepared by: Gustavus Bryant  Exercises - Shoulder External Rotation and Scapular Retraction with Resistance  - 2 x daily - 5 x weekly - 2 sets - 10 reps - 2s hold - Standing Shoulder Horizontal Abduction with Resistance  - 2 x daily - 5 x weekly - 2 sets - 10 reps - 2s hold - Standing Shoulder Posterior Capsule Stretch  - 2 x daily - 5 x weekly - 1 sets - 2 reps - 30s hold  ASSESSMENT:  CLINICAL IMPRESSION: Patient is a 57 y.o. female who was seen today for physical therapy evaluation and treatment for posterior R shoulder pain. Patient presents with full cervical and BUE AROM.  Postural changes include rounded shoulders and kyphotic posture.  Palpation finds tenderness in thoracic paraspinals and teres groups presenting as myofascial trigger points.  Patient is s good candidate for OPPT to include TPDN.  OBJECTIVE IMPAIRMENTS: decreased activity tolerance, decreased knowledge of condition, increased fascial restrictions, increased muscle spasms, impaired UE functional use, postural dysfunction, and pain.   ACTIVITY LIMITATIONS: carrying, lifting, sitting, sleeping, and computer work  PERSONAL FACTORS: Age, Education, and Fitness are also affecting patient's functional outcome.   REHAB POTENTIAL: Good  CLINICAL DECISION MAKING: Stable/uncomplicated  EVALUATION COMPLEXITY: Low   GOALS: Goals reviewed with patient? No  SHORT TERM GOALS=LONG TERM GOALS: Target date: 05/02/2023    Patient to demonstrate  independence in HEP  Baseline: [id number] Goal status: INITIAL  2.  Patient will acknowledge 4/10 pain at least once during episode of care   Baseline: 9/10 Goal status: INITIAL  3.  Patient will score at least 77% on FOTO to signify clinically meaningful improvement in functional abilities.   Baseline: 36 Goal status: INITIAL  4.  Decrease TTP in affected regions to minimal Baseline: Moderate to severe Goal status: INITIAL     PLAN:  PT FREQUENCY: 1-2x/week  PT DURATION: 6 weeks  PLANNED INTERVENTIONS: 97164- PT Re-evaluation, 97110-Therapeutic exercises, 97530- Therapeutic activity, 97112- Neuromuscular re-education, 97535- Self Care, 96295- Manual therapy, 97032- Electrical stimulation (manual), Dry Needling, Joint mobilization, Spinal mobilization, Cryotherapy, and Moist heat  PLAN FOR NEXT SESSION: HEP review and update, manual techniques as appropriate, aerobic tasks, ROM and flexibility activities, strengthening and PREs, TPDN, gait and balance training as needed     Hildred Laser, PT 03/21/2023, 3:37 PM

## 2023-03-23 ENCOUNTER — Ambulatory Visit: Payer: Managed Care, Other (non HMO)

## 2023-03-23 DIAGNOSIS — M546 Pain in thoracic spine: Secondary | ICD-10-CM | POA: Diagnosis not present

## 2023-03-23 DIAGNOSIS — R293 Abnormal posture: Secondary | ICD-10-CM

## 2023-03-23 DIAGNOSIS — M6281 Muscle weakness (generalized): Secondary | ICD-10-CM

## 2023-03-23 NOTE — Therapy (Signed)
OUTPATIENT PHYSICAL THERAPY CERVICAL EVALUATION   Patient Name: Megan Armstrong MRN: [id number] DOB:05-03-1965, 57 y.o., female Today's Date: 03/23/2023  END OF SESSION:  PT End of Session - 03/23/23 0805     Visit Number 2    Number of Visits 8    Date for PT Re-Evaluation 05/22/23    Authorization Type cigna    PT Start Time 0745    PT Stop Time 0830    PT Time Calculation (min) 45 min    Activity Tolerance Patient tolerated treatment well    Behavior During Therapy WFL for tasks assessed/performed             Past Medical History:  Diagnosis Date   Allergic rhinitis    Endometrial hyperplasia    Endometriosis    Family history of breast cancer    Family history of breast cancer    Hx of endometriosis    rectal vag with laser surgery    Seasonal allergies    TB skin/subcutaneous    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   Past Surgical History:  Procedure Laterality Date   DIAGNOSTIC LAPAROSCOPY     MASTECTOMY W/ SENTINEL NODE BIOPSY Bilateral 09/16/2018   Procedure: RIGHT MASTECTOMY WITH RIGHT AXILLARY SENTINEL LYMPH NODE BIOPSY AND LEFT RISK REDUCING MASTECTOMY;  Surgeon: Emelia Loron, MD;  Location: [facility];  Service: General;  Laterality: Bilateral;   Patient Active Problem List   Diagnosis Date Noted   Family history of cardiovascular disorder 09/12/2022   Breast cancer, right (HCC) 09/16/2018   Genetic testing 09/09/2018   Malignant neoplasm of upper-inner quadrant of right breast in female, estrogen receptor positive (HCC) 08/19/2018   Family history of breast cancer    History of penicillin allergy 09/24/2012   Exposure to strep throat 09/24/2012   Perioral dermatitis 09/24/2012   Left otitis media with effusion 08/16/2011   Hx of antibiotic allergy 05/03/2011   Disorder of teeth and supporting structures 03/30/2008   Allergic rhinitis 02/17/2008   Endometriosis 02/17/2008   Headache 02/17/2008    Hx gestational diabetes 02/17/2008   POSITIVE PPD 12/17/2007    PCP: Madelin Headings, MD   REFERRING PROVIDER: Kristian Covey, MD  REFERRING DIAG: M54.9 (ICD-10-CM) - Upper back pain on right side  THERAPY DIAG:  Pain in thoracic spine  Abnormal posture  Muscle weakness (generalized)  Rationale for Evaluation and Treatment: Rehabilitation  ONSET DATE: 2 weeks  SUBJECTIVE:  SUBJECTIVE STATEMENT: Compliant with HEP and awoke with pain today Hand dominance: Right  PERTINENT HISTORY:  Seen today with onset about a week and a half ago of right periscapular pain.  This did follow lifting some heavy objects but she does not recall specific injury.  She then had hysteroscopy last week and thinks this may have exacerbated this pain possibly due to position she was in with her procedure.  Her pain is right periscapular region.  No neck pain.  No shoulder pain.  Denies any cough, fever, dyspnea, or pleuritic pain.   She tried yoga exercises, ibuprofen, heat, and topical sports cream type ointment without much improvement.  Pain has been worsening.  Does seem to be worse with change of position and movement.   She has past medical history significant for breast cancer and is on chronic tamoxifen.  PAIN:  Are you having pain? Yes: NPRS scale: 9/10 Pain location: R posterior shoulder  Pain description: ache Aggravating factors: prolonged positions, driving, desk work Relieving factors: heat, rest  PRECAUTIONS: None  RED FLAGS: None     WEIGHT BEARING RESTRICTIONS: No  FALLS:  Has patient fallen in last 6 months? No  OCCUPATION: office work  PLOF: Independent  PATIENT GOALS: To manage my pain  NEXT MD VISIT: TBD  OBJECTIVE:  Note: Objective measures were completed at  Evaluation unless otherwise noted.  DIAGNOSTIC FINDINGS:  IMPRESSION: 1. No acute cardiopulmonary disease. Specifically, no evidence of pneumonia. 2. Mild pectus deformity as demonstrated on cardiac CT performed 11/17/2022.     Electronically Signed   By: Simonne Come M.D.   On: 03/18/2023 16:34  PATIENT SURVEYS:  FOTO 37(66 predicted)  POSTURE: rounded shoulders, forward head, and increased thoracic kyphosis  PALPATION: TTP R thoracic paraspinals, teres major/minor    CERVICAL ROM: WNL throughout  Active ROM A/PROM (deg) eval  Flexion   Extension   Right lateral flexion   Left lateral flexion   Right rotation   Left rotation    (Blank rows = not tested)  UPPER EXTREMITY ROM: WNL throughout  Active ROM Right eval Left eval  Shoulder flexion    Shoulder extension    Shoulder abduction    Shoulder adduction    Shoulder extension    Shoulder internal rotation    Shoulder external rotation    Elbow flexion    Elbow extension    Wrist flexion    Wrist extension    Wrist ulnar deviation    Wrist radial deviation    Wrist pronation    Wrist supination     (Blank rows = not tested)  UPPER EXTREMITY MMT:  MMT Right eval Left eval  Shoulder flexion    Shoulder extension    Shoulder abduction    Shoulder adduction    Shoulder extension    Shoulder internal rotation    Shoulder external rotation    Middle trapezius    Lower trapezius    Elbow flexion    Elbow extension    Wrist flexion    Wrist extension    Wrist ulnar deviation    Wrist radial deviation    Wrist pronation    Wrist supination    Grip strength     (Blank rows = not tested)  CERVICAL SPECIAL TESTS:  Upper limb tension test (ULTT): Negative  FUNCTIONAL TESTS:  30 seconds chair stand test n/a  TODAY'S TREATMENT:          [facility] Adult PT Treatment:  DATE: 03/23/23 Therapeutic Exercise: R UT stretch 30s x2 R levator stretch 30s x2 Hor  abd YTB 15x ER YTB 15x Nustep L2 6 min Manual Therapy: Skilled palpation to identify taught and irritable bands in R thoracic paraspinals T6-7 region, R levator scap, R infraspinatus Trigger Point Dry Needling Treatment: Pre-treatment instruction: Patient instructed on dry needling rationale, procedures, and possible side effects including pain during treatment (achy,cramping feeling), bruising, drop of blood, lightheadedness, nausea, sweating. Patient Consent Given: Yes Education handout provided: Yes Muscles treated: R thoracic paraspinals T6-7 region, R levator scap, R infraspinatus  Needle size and number: .25x16mm x 3 Electrical stimulation performed: No Parameters: N/A Treatment response/outcome: Palpable decrease in muscle tension and decreased discomfort Post-treatment instructions: Patient instructed to expect possible mild to moderate muscle soreness later today and/or tomorrow. Patient instructed in methods to reduce muscle soreness and to continue prescribed HEP. If patient was dry needled over the lung field, patient was instructed on signs and symptoms of pneumothorax and, however unlikely, to see immediate medical attention should they occur. Patient was also educated on signs and symptoms of infection and to seek medical attention should they occur. Patient verbalized understanding of these instructions and education.                                                                                                                DATE: 03/21/23 Eval and  HEP  PATIENT EDUCATION:  Education details: Discussed eval findings, rehab rationale and POC and patient is in agreement  Person educated: Patient Education method: Explanation Education comprehension: verbalized understanding and needs further education  HOME EXERCISE PROGRAM: Access Code: [email or web address] URL: [email or web address] Date: 03/21/2023 Prepared by: Gustavus Bryant  Exercises - Shoulder External Rotation  and Scapular Retraction with Resistance  - 2 x daily - 5 x weekly - 2 sets - 10 reps - 2s hold - Standing Shoulder Horizontal Abduction with Resistance  - 2 x daily - 5 x weekly - 2 sets - 10 reps - 2s hold - Standing Shoulder Posterior Capsule Stretch  - 2 x daily - 5 x weekly - 1 sets - 2 reps - 30s hold  ASSESSMENT:  CLINICAL IMPRESSION: Able to tolerate TPDN well.  Minimal twitch response observed but subjectively noted reproduction of symptoms.  Followed up tecnique with stretch of affected region, review of HEP and aerobic work to increase peripheral circulation and mitigate soreness post-needling  Patient is a 57 y.o. female who was seen today for physical therapy evaluation and treatment for posterior R shoulder pain. Patient presents with full cervical and BUE AROM.  Postural changes include rounded shoulders and kyphotic posture.  Palpation finds tenderness in thoracic paraspinals and teres groups presenting as myofascial trigger points.  Patient is s good candidate for OPPT to include TPDN.  OBJECTIVE IMPAIRMENTS: decreased activity tolerance, decreased knowledge of condition, increased fascial restrictions, increased muscle spasms, impaired UE functional use, postural dysfunction, and pain.   ACTIVITY LIMITATIONS: carrying, lifting, sitting, sleeping, and computer work  PERSONAL FACTORS: Age, Education, and  Fitness are also affecting patient's functional outcome.   REHAB POTENTIAL: Good  CLINICAL DECISION MAKING: Stable/uncomplicated  EVALUATION COMPLEXITY: Low   GOALS: Goals reviewed with patient? No  SHORT TERM GOALS=LONG TERM GOALS: Target date: 05/02/2023    Patient to demonstrate independence in HEP  Baseline: [id number] Goal status: INITIAL  2.  Patient will acknowledge 4/10 pain at least once during episode of care   Baseline: 9/10 Goal status: INITIAL  3.  Patient will score at least 77% on FOTO to signify clinically meaningful improvement in functional  abilities.   Baseline: 36 Goal status: INITIAL  4.  Decrease TTP in affected regions to minimal Baseline: Moderate to severe Goal status: INITIAL     PLAN:  PT FREQUENCY: 1-2x/week  PT DURATION: 6 weeks  PLANNED INTERVENTIONS: 97164- PT Re-evaluation, 97110-Therapeutic exercises, 97530- Therapeutic activity, 97112- Neuromuscular re-education, 97535- Self Care, 54098- Manual therapy, 97032- Electrical stimulation (manual), Dry Needling, Joint mobilization, Spinal mobilization, Cryotherapy, and Moist heat  PLAN FOR NEXT SESSION: HEP review and update, manual techniques as appropriate, aerobic tasks, ROM and flexibility activities, strengthening and PREs, TPDN, gait and balance training as needed     Hildred Laser, PT 03/23/2023, 8:23 AM

## 2023-03-27 NOTE — Therapy (Unsigned)
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   Past Surgical History:  Procedure Laterality Date   DIAGNOSTIC LAPAROSCOPY     MASTECTOMY W/ SENTINEL NODE BIOPSY Bilateral 09/16/2018   Procedure: RIGHT MASTECTOMY WITH RIGHT AXILLARY SENTINEL LYMPH NODE BIOPSY AND LEFT RISK REDUCING MASTECTOMY;  Surgeon: Emelia Loron, MD;  Location: [facility];  Service: General;  Laterality: Bilateral;   Patient Active Problem List   Diagnosis Date Noted   Family history of cardiovascular disorder 09/12/2022   Breast cancer, right (HCC) 09/16/2018   Genetic testing 09/09/2018   Malignant neoplasm of upper-inner quadrant of right breast in female, estrogen receptor positive (HCC) 08/19/2018   Family history of breast cancer    History of penicillin allergy 09/24/2012   Exposure to strep throat 09/24/2012   Perioral dermatitis 09/24/2012   Left otitis media with effusion 08/16/2011   Hx of antibiotic allergy 05/03/2011   Disorder of teeth and supporting structures 03/30/2008   Allergic rhinitis 02/17/2008   Endometriosis 02/17/2008   Headache 02/17/2008   Hx gestational diabetes 02/17/2008   POSITIVE PPD 12/17/2007    PCP: Madelin Headings, MD   REFERRING PROVIDER: Kristian Covey, MD  REFERRING DIAG: M54.9 (ICD-10-CM) - Upper back pain on right side  THERAPY DIAG:  No diagnosis found.  Rationale for Evaluation and Treatment: Rehabilitation  ONSET DATE: 2 weeks  SUBJECTIVE:                                                                                                                                                                                                          SUBJECTIVE STATEMENT: Compliant with HEP and awoke with pain today Hand dominance: Right  PERTINENT HISTORY:  Seen today with onset about a week and a half ago of right periscapular pain.  This did follow lifting some heavy objects but she does not recall specific injury.  She then had hysteroscopy last week and thinks this may have exacerbated this pain possibly due to position she was  in with her procedure.  Her pain is right periscapular region.  No neck pain.  No shoulder pain.  Denies any cough, fever, dyspnea, or pleuritic pain.   She tried yoga exercises, ibuprofen, heat, and topical sports cream type ointment without much improvement.  Pain has been worsening.  Does seem to be worse with change of position and movement.   She has past medical history significant for breast cancer and is on chronic tamoxifen.  PAIN:  Are you having pain? Yes: NPRS scale: 9/10 Pain location: R posterior shoulder  Pain description: ache Aggravating factors: prolonged positions, driving, desk work Relieving factors: heat, rest  PRECAUTIONS: None  RED FLAGS: None     WEIGHT BEARING RESTRICTIONS: No  FALLS:  Has patient fallen in last 6 months? No  OCCUPATION: office work  PLOF: Independent  PATIENT GOALS: To manage my pain  NEXT MD VISIT: TBD  OBJECTIVE:  Note: Objective measures were completed at Evaluation unless otherwise noted.  DIAGNOSTIC FINDINGS:  IMPRESSION: 1. No acute cardiopulmonary disease. Specifically, no evidence of pneumonia. 2. Mild pectus deformity as demonstrated on cardiac CT performed 11/17/2022.     Electronically Signed   By: Simonne Come M.D.   On: 03/18/2023 16:34  PATIENT SURVEYS:  FOTO 37(66 predicted)  POSTURE: rounded shoulders, forward head, and increased thoracic  kyphosis  PALPATION: TTP R thoracic paraspinals, teres major/minor    CERVICAL ROM: WNL throughout  Active ROM A/PROM (deg) eval  Flexion   Extension   Right lateral flexion   Left lateral flexion   Right rotation   Left rotation    (Blank rows = not tested)  UPPER EXTREMITY ROM: WNL throughout  Active ROM Right eval Left eval  Shoulder flexion    Shoulder extension    Shoulder abduction    Shoulder adduction    Shoulder extension    Shoulder internal rotation    Shoulder external rotation    Elbow flexion    Elbow extension    Wrist flexion    Wrist extension    Wrist ulnar deviation    Wrist radial deviation    Wrist pronation    Wrist supination     (Blank rows = not tested)  UPPER EXTREMITY MMT:  MMT Right eval Left eval  Shoulder flexion    Shoulder extension    Shoulder abduction    Shoulder adduction    Shoulder extension    Shoulder internal rotation    Shoulder external rotation    Middle trapezius    Lower trapezius    Elbow flexion    Elbow extension    Wrist flexion    Wrist extension    Wrist ulnar deviation    Wrist radial deviation    Wrist pronation    Wrist supination    Grip strength     (Blank rows = not tested)  CERVICAL SPECIAL TESTS:  Upper limb tension test (ULTT): Negative  FUNCTIONAL TESTS:  30 seconds chair stand test n/a  TODAY'S TREATMENT:          [facility] Adult PT Treatment:                                                DATE: 03/23/23 Therapeutic Exercise: R UT stretch 30s x2 R levator stretch 30s x2 Hor abd YTB 15x ER YTB 15x Nustep L2 6 min  Manual Therapy: Skilled palpation to identify taught and irritable bands in R thoracic paraspinals T6-7 region, R levator scap, R infraspinatus Trigger Point Dry Needling Treatment: Pre-treatment instruction: Patient instructed on dry needling rationale, procedures, and possible side effects including pain during treatment (achy,cramping feeling), bruising, drop of blood,  lightheadedness, nausea, sweating. Patient Consent Given: Yes Education handout provided: Yes Muscles treated: R thoracic paraspinals T6-7 region, R levator scap, R infraspinatus  Needle size and number: .25x21mm x 3 Electrical stimulation performed: No Parameters: N/A Treatment response/outcome: Palpable decrease in muscle tension and decreased discomfort Post-treatment instructions: Patient instructed to expect possible mild to moderate muscle soreness later today and/or tomorrow. Patient instructed in methods to reduce muscle soreness and to continue prescribed HEP. If patient was dry needled over the lung field, patient was instructed on signs and symptoms of pneumothorax and, however unlikely, to see immediate medical attention should they occur. Patient was also educated on signs and symptoms of infection and to seek medical attention should they occur. Patient verbalized understanding of these instructions and education.                                                                                                                DATE: 03/21/23 Eval and  HEP  PATIENT EDUCATION:  Education details: Discussed eval findings, rehab rationale and POC and patient is in agreement  Person educated: Patient Education method: Explanation Education comprehension: verbalized understanding and needs further education  HOME EXERCISE PROGRAM: Access Code: [email or web address] URL: [email or web address] Date: 03/21/2023 Prepared by: Gustavus Bryant  Exercises - Shoulder External Rotation and Scapular Retraction with Resistance  - 2 x daily - 5 x weekly - 2 sets - 10 reps - 2s hold - Standing Shoulder Horizontal Abduction with Resistance  - 2 x daily - 5 x weekly - 2 sets - 10 reps - 2s hold - Standing Shoulder Posterior Capsule Stretch  - 2 x daily - 5 x weekly - 1 sets - 2 reps - 30s hold  ASSESSMENT:  CLINICAL IMPRESSION: Able to tolerate TPDN well.  Minimal twitch response observed but  subjectively noted reproduction of symptoms.  Followed up tecnique with stretch of affected region, review of HEP and aerobic work to increase peripheral circulation and mitigate soreness post-needling  Patient is a 57 y.o. female who was seen today for physical therapy evaluation and treatment for posterior R shoulder pain. Patient presents with full cervical and BUE AROM.  Postural changes include rounded shoulders and kyphotic posture.  Palpation finds tenderness in thoracic paraspinals and teres groups presenting as myofascial trigger points.  Patient is s good candidate for OPPT to include TPDN.  OBJECTIVE IMPAIRMENTS: decreased activity tolerance, decreased knowledge of condition, increased fascial restrictions, increased muscle spasms, impaired UE functional use, postural dysfunction, and pain.   ACTIVITY LIMITATIONS: carrying, lifting, sitting, sleeping, and computer work  PERSONAL FACTORS: Age, Education, and Fitness are also affecting patient's functional outcome.   REHAB POTENTIAL: Good  CLINICAL DECISION MAKING: Stable/uncomplicated  EVALUATION COMPLEXITY: Low   GOALS: Goals  reviewed with patient? No  SHORT TERM GOALS=LONG TERM GOALS: Target date: 05/02/2023    Patient to demonstrate independence in HEP  Baseline: [id number] Goal status: INITIAL  2.  Patient will acknowledge 4/10 pain at least once during episode of care   Baseline: 9/10 Goal status: INITIAL  3.  Patient will score at least 77% on FOTO to signify clinically meaningful improvement in functional abilities.   Baseline: 36 Goal status: INITIAL  4.  Decrease TTP in affected regions to minimal Baseline: Moderate to severe Goal status: INITIAL     PLAN:  PT FREQUENCY: 1-2x/week  PT DURATION: 6 weeks  PLANNED INTERVENTIONS: 97164- PT Re-evaluation, 97110-Therapeutic exercises, 97530- Therapeutic activity, 97112- Neuromuscular re-education, 97535- Self Care, 54098- Manual therapy, 97032- Electrical  stimulation (manual), Dry Needling, Joint mobilization, Spinal mobilization, Cryotherapy, and Moist heat  PLAN FOR NEXT SESSION: HEP review and update, manual techniques as appropriate, aerobic tasks, ROM and flexibility activities, strengthening and PREs, TPDN, gait and balance training as needed     Hildred Laser, PT 03/27/2023, 10:41 AM

## 2023-03-28 ENCOUNTER — Ambulatory Visit: Payer: Managed Care, Other (non HMO)

## 2023-03-28 DIAGNOSIS — M546 Pain in thoracic spine: Secondary | ICD-10-CM

## 2023-03-28 DIAGNOSIS — M6281 Muscle weakness (generalized): Secondary | ICD-10-CM

## 2023-03-28 DIAGNOSIS — R293 Abnormal posture: Secondary | ICD-10-CM

## 2023-03-29 ENCOUNTER — Inpatient Hospital Stay: Payer: Managed Care, Other (non HMO) | Attending: Hematology and Oncology | Admitting: Hematology and Oncology

## 2023-03-29 VITALS — BP 125/60 | HR 94 | Temp 97.2°F | Resp 18 | Ht 62.5 in | Wt 122.5 lb

## 2023-03-29 DIAGNOSIS — C50211 Malignant neoplasm of upper-inner quadrant of right female breast: Secondary | ICD-10-CM | POA: Insufficient documentation

## 2023-03-29 DIAGNOSIS — Z170 Estrogen receptor positive status [ER+]: Secondary | ICD-10-CM | POA: Diagnosis not present

## 2023-03-29 DIAGNOSIS — N926 Irregular menstruation, unspecified: Secondary | ICD-10-CM | POA: Insufficient documentation

## 2023-03-29 DIAGNOSIS — R0789 Other chest pain: Secondary | ICD-10-CM | POA: Diagnosis not present

## 2023-03-29 DIAGNOSIS — Z79810 Long term (current) use of selective estrogen receptor modulators (SERMs): Secondary | ICD-10-CM | POA: Diagnosis not present

## 2023-03-29 MED ORDER — TAMOXIFEN CITRATE 20 MG PO TABS: ORAL_TABLET | ORAL | 3 refills | Status: AC

## 2023-03-29 NOTE — Assessment & Plan Note (Signed)
08/13/2018: Palpable mass at 1230 to 1 o'clock position right breast with skin indentation and focal dimpling UOQ, ultrasound to masses.  12:30 position: 2 cm mass, at 1 o'clock position: 0.9 cm mass, together 3.4 cm.  UOQ: 2 additional masses 0.7 cm and 0.7 cm, indeterminate retroareolar calcifications.   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  T1c N0 stage Ia clinical stage   09/16/2018:Bilateral mastectomies: Left mastectomy benign, right mastectomy: Invasive lobular cancer, multifocal, largest tumor 3.2 cm, grade 2, margins negative, 1/2 lymph nodes with isolated tumor cells, ER 90%, PR 70%, HER-2 -1+, Ki-67 10% T2 N0 I+ stage Ia   Recommendations: 1. Oncotype DX score: 17, risk of distant recurrence 5% at 9 years: No chemo 2. Adjuvant antiestrogen therapy with tamoxifen since she is still premenopausal started 09/28/2018 She is a pharmacist and stays extremely busy. --------------------------------------------------------------------------------------------------------------------------------- Tamoxifen toxicities:  Hot flashes have gotten slightly worse than before. Fatigue and abdominal bloating Her last menstrual cycle was in October 2022.   Dr.Taavon is monitoring her uterine hypertrophy.  I recommended annual GYN follow-ups   Breast cancer surveillance: 1.  Chest wall examination 03/29/2023: No palpable lumps or nodules of concern 2.  No role of imaging studies because she had bilateral mastectomies.   Return to clinic in 1 year for follow-up

## 2023-03-29 NOTE — Progress Notes (Signed)
Patient Care Team: Panosh, Neta Mends, MD as PCP - General Olivia Mackie, MD as Consulting Physician (Obstetrics and Gynecology) Charna Elizabeth, MD as Consulting Physician (Gastroenterology) Serena Croissant, MD as Consulting Physician (Hematology and Oncology) Glenna Fellows, MD as Consulting Physician (Plastic Surgery) Emelia Loron, MD as Consulting Physician (General Surgery)  DIAGNOSIS:  Encounter Diagnoses  Name Primary?   Malignant neoplasm of upper-inner quadrant of right breast in female, estrogen receptor positive (HCC) Yes   Other chest pain     SUMMARY OF ONCOLOGIC HISTORY: Oncology History  Malignant neoplasm of upper-inner quadrant of right breast in female, estrogen receptor positive (HCC)  08/13/2018 Initial Diagnosis   Palpable mass at 1230 to 1 o'clock position right breast with skin indentation and focal dimpling UOQ, ultrasound to masses.  12:30 position: 2 cm mass, at 1 o'clock position: 0.9 cm mass, together 3.4 cm.  UOQ: 2 additional masses 0.7 cm and 0.7 cm, indeterminate retroareolar calcifications.   08/13/2018 Pathology Results   Right breast needle core biopsy 1230: Grade 2 ILC with LCIS, UOQ 10:30 position: Grade 2 ILC with LCIS, ER 90%, PR 30%, Ki-67 10%, HER-2 -1+ by IHC.  T1c N0 stage Ia clinical stage   09/06/2018 Genetic Testing   Negative genetic testing on the CustomNext-Cancer+RNAinsight testing. 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  The CustomNext-Expanded gene panel offered by [facility] and includes sequencing and rearrangement analysis for the following 81 genes: AIP, ALK, APC*, ATM*, AXIN2, BAP1, BARD1, BLM, BMPR1A, BRCA1*, BRCA2*, BRIP1*, CDC73, CDH1*, CDK4, CDKN1B, CDKN2A, CHEK2*, CTNNA1, DICER1, FANCC, FH, FLCN, GALNT12, HOXB13, KIT, MAX, MEN1, MET, MLH1*, MRE11A, MSH2*, MSH6*, MUTYH*, NBN, NF1*, NF2, NTHL1, PALB2*, PDGFRA, PHOX2B, PMS2*, POLD1, POLE, POT1, PRKAR1A, PTCH1, PTEN*, RAD50, RAD51C*, RAD51D*, RB1, RET, SDHA, SDHAF2, SDHB,  SDHC, SDHD, SMAD4, SMARCA4, SMARCB1, SMARCE1, STK11, SUFU, TMEM127, TP53*, TSC1, TSC2, VHL and XRCC2 (sequencing and deletion/duplication); CASR, CFTR, CPA1, CTRC, EGFR, MITF, PRSS1 and SPINK1 (sequencing only); EPCAM and GREM1 (deletion/duplication only). DNA and RNA analyses performed for * genes.  The report date is Sep 06, 2018.   09/16/2018 Surgery   Bilateral mastectomies: Left mastectomy benign, right mastectomy: Invasive lobular cancer, multifocal, largest tumor 3.2 cm, grade 2, margins negative, 1/2 lymph nodes with isolated tumor cells, ER 90%, PR 70%, HER-2 -1+, Ki-67 10% T2 N0 I+ stage Ia   09/16/2018 Oncotype testing   17/5%   09/20/2018 Cancer Staging   Staging form: Breast, AJCC 8th Edition - Pathologic stage from 09/20/2018: Stage IA (pT2, pN0(i+)(sn), cM0, G2, ER+, PR+, HER2-) - Signed by Serena Croissant, MD on 09/20/2018   09/2018 -  Anti-estrogen oral therapy   Tamoxifen daily     CHIEF COMPLIANT: Follow-up on tamoxifen therapy, sharp pain upper back towards the right  History of Present Illness   The patient, with a history of breast cancer and currently on tamoxifen, presents with a chief complaint of scapular pain that has been ongoing for about two to three weeks. The pain is located in the right shoulder blade and radiates towards the middle. The patient describes the pain as throbbing and reports that it has not been relieved by over-the-counter medications or physical therapy, including dry needling. The pain has affected the patient's ability to drive and perform daily activities. The patient suspects that the pain may have been caused by lifting heavy objects at work.  In addition to the scapular pain, the patient discusses her menopausal status. Despite being 57 years old, the patient has not yet entered menopause.  The patient had not had a period for eight months, but then experienced a period in October. This prompted a visit to her gynecologist, who performed a  hysteroscopy with [facility] and biopsy. The results of these procedures were normal. The patient is currently on tamoxifen for her history of breast cancer.         ALLERGIES:  is allergic to codeine, penicillins, clindamycin, clindamycin/lincomycin, keflex [cephalexin], and penicillin g.  MEDICATIONS:  Current Outpatient Medications  Medication Sig Dispense Refill   Multiple Vitamins-Minerals (CENTRUM ULTRA WOMENS PO) Take by mouth.     Oxymetazoline HCl ([employer] NASAL SPRAY NA) Place into the nose as needed.     tamoxifen (NOLVADEX) 20 MG tablet TAKE 1 TABLET(20 MG) BY MOUTH DAILY 90 tablet 3   vitamin C (ASCORBIC ACID) 250 MG tablet Take 250 mg by mouth daily.     No current facility-administered medications for this visit.    PHYSICAL EXAMINATION: ECOG PERFORMANCE STATUS: 1 - Symptomatic but completely ambulatory  Vitals:   03/29/23 0856  BP: 125/60  Pulse: 94  Resp: 18  Temp: (!) 97.2 F (36.2 C)  SpO2: 100%   Filed Weights   03/29/23 0856  Weight: 122 lb 8 oz (55.6 kg)    Physical Exam   EXTREMITIES: Tenderness in the scapular region extending towards the middle of the shoulder blade. MUSCULOSKELETAL: Pain upon raising arms slightly.      (exam performed in the presence of a chaperone)  LABORATORY DATA:  I have reviewed the data as listed    Latest Ref Rng & Units 04/25/2021    9:13 AM 01/08/2020    8:22 AM 03/14/2019    7:39 AM  CMP  Glucose 70 - 99 mg/dL 91  90  92   BUN 6 - 23 mg/dL 11  19  14    Creatinine 0.40 - 1.20 mg/dL 6.04  5.40  9.81   Sodium 135 - 145 mEq/L 139  139  138   Potassium 3.5 - 5.1 mEq/L 4.4  4.5  4.3   Chloride 96 - 112 mEq/L 106  107  105   CO2 19 - 32 mEq/L 27  22  26    Calcium 8.4 - 10.5 mg/dL 8.8  8.9  8.9   Total Protein 6.0 - 8.3 g/dL 6.9  6.5  6.4   Total Bilirubin 0.2 - 1.2 mg/dL 0.4  0.3  0.4   Alkaline Phos 39 - 117 U/L 43   39   AST 0 - 37 U/L 14  16  15    ALT 0 - 35 U/L 11  16  21      Lab Results  Component Value Date   WBC  5.8 04/25/2021   HGB 12.6 04/25/2021   HCT 38.8 04/25/2021   MCV 85.2 04/25/2021   PLT 273.0 04/25/2021   NEUTROABS 3.6 04/25/2021    ASSESSMENT & PLAN:  Malignant neoplasm of upper-inner quadrant of right breast in female, estrogen receptor positive (HCC) 08/13/2018: Palpable mass at 1230 to 1 o'clock position right breast with skin indentation and focal dimpling UOQ, ultrasound to masses.  12:30 position: 2 cm mass, at 1 o'clock position: 0.9 cm mass, together 3.4 cm.  UOQ: 2 additional masses 0.7 cm and 0.7 cm, indeterminate retroareolar calcifications.   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  T1c N0 stage Ia clinical stage  09/16/2018:Bilateral mastectomies: Left mastectomy benign, right mastectomy: Invasive lobular cancer, multifocal, largest tumor 3.2 cm, grade 2, margins negative, 1/2 lymph nodes with isolated tumor cells, ER 90%, PR 70%, HER-2 -1+, Ki-67 10% T2 N0 I+ stage Ia   Recommendations: 1. Oncotype DX score: 17, risk of distant recurrence 5% at 9 years: No chemo 2. Adjuvant antiestrogen therapy with tamoxifen since she is still premenopausal started 09/28/2018 She is a pharmacist and stays extremely busy. --------------------------------------------------------------------------------------------------------------------------------- Tamoxifen toxicities:  Hot flashes have gotten slightly worse than before. Fatigue and abdominal bloating Her last menstrual cycle was in October 2022.   Dr.Taavon is monitoring her uterine hypertrophy.  I recommended annual GYN follow-ups   Breast cancer surveillance: 1.  Chest wall examination 03/29/2023: No palpable lumps or nodules of concern 2.  No role of imaging studies because she had bilateral mastectomies.     Scapular Pain Persistent pain for 2-3 weeks, radiating from scapula to chest. X-ray showed muscle issues. Currently  undergoing physiotherapy with some relief. -Order urgent CT chest to rule out internal causes. -Continue physiotherapy and use of hot pack for symptomatic relief.  Menopause Irregular periods, last period 8 months ago followed by a sudden period in October. Recent hysteroscopy, DNC, and biopsy were normal. -Continue current management. -Request annual uterine check from OBGYN due to ongoing tamoxifen use.  Breast Cancer On tamoxifen, no current issues reported. -Continue tamoxifen.    Follow-up Await CT results, continue current treatments, and schedule annual uterine check with OBGYN.     Follow-up in 1 year     Orders Placed This Encounter  Procedures   CT Chest W Contrast    Standing Status:   Future    Expected Date:   03/30/2023    Expiration Date:   03/28/2024    If indicated for the ordered procedure, I authorize the administration of contrast media per Radiology protocol:   Yes    Does the patient have a contrast media/X-ray dye allergy?:   No    Is patient pregnant?:   No    Preferred imaging location?:   [facility]    Release to patient:   Immediate [1]   The patient has a good understanding of the overall plan. she agrees with it. she will call with any problems that may develop before the next visit here. Total time spent: 30 mins including face to face time and time spent for planning, charting and co-ordination of care   Tamsen Meek, MD 03/29/23

## 2023-03-30 ENCOUNTER — Ambulatory Visit: Payer: Managed Care, Other (non HMO)

## 2023-03-30 DIAGNOSIS — M546 Pain in thoracic spine: Secondary | ICD-10-CM | POA: Diagnosis not present

## 2023-03-30 DIAGNOSIS — R293 Abnormal posture: Secondary | ICD-10-CM

## 2023-03-30 DIAGNOSIS — M6281 Muscle weakness (generalized): Secondary | ICD-10-CM

## 2023-03-30 NOTE — Therapy (Signed)
[facility] OUTPATIENT PHYSICAL THERAPY CERVICAL EVALUATION   Patient Name: Megan Armstrong MRN: [id number] DOB:05/26/1965, 57 y.o., female Today's Date: 03/30/2023  END OF SESSION:  PT End of Session - 03/30/23 1348     Visit Number 4    Number of Visits 8    Date for PT Re-Evaluation 05/22/23    Authorization Type cigna    PT Start Time 1345    PT Stop Time 1430    PT Time Calculation (min) 45 min    Activity Tolerance Patient tolerated treatment well    Behavior During Therapy WFL for tasks assessed/performed               Past Medical History:  Diagnosis Date   Allergic rhinitis    Endometrial hyperplasia    Endometriosis    Family history of breast cancer    Family history of breast cancer    Hx of endometriosis    rectal vag with laser surgery    Seasonal allergies    TB skin/subcutaneous    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   Past Surgical History:  Procedure Laterality Date   DIAGNOSTIC LAPAROSCOPY     MASTECTOMY W/ SENTINEL NODE BIOPSY Bilateral 09/16/2018   Procedure: RIGHT MASTECTOMY WITH RIGHT AXILLARY SENTINEL LYMPH NODE BIOPSY AND LEFT RISK REDUCING MASTECTOMY;  Surgeon: Emelia Loron, MD;  Location: [facility];  Service: General;  Laterality: Bilateral;   Patient Active Problem List   Diagnosis Date Noted   Family history of cardiovascular disorder 09/12/2022   Breast cancer, right (HCC) 09/16/2018   Genetic testing 09/09/2018   Malignant neoplasm of upper-inner quadrant of right breast in female, estrogen receptor positive (HCC) 08/19/2018   Family history of breast cancer    History of penicillin allergy 09/24/2012   Exposure to strep throat 09/24/2012   Perioral dermatitis 09/24/2012   Left otitis media with effusion 08/16/2011   Hx of antibiotic allergy 05/03/2011   Disorder of teeth and supporting structures 03/30/2008   Allergic rhinitis 02/17/2008   Endometriosis 02/17/2008   Headache  02/17/2008   Hx gestational diabetes 02/17/2008   POSITIVE PPD 12/17/2007    PCP: Madelin Headings, MD   REFERRING PROVIDER: Kristian Covey, MD  REFERRING DIAG: M54.9 (ICD-10-CM) - Upper back pain on right side  THERAPY DIAG:  Pain in thoracic spine  Abnormal posture  Muscle weakness (generalized)  Rationale for Evaluation and Treatment: Rehabilitation  ONSET DATE: 2 weeks  SUBJECTIVE:  SUBJECTIVE STATEMENT: Continues to report less discomfort with sleep positions.  Has noted discomfort when reading but neutral posture now painfree Hand dominance: Right  PERTINENT HISTORY:  Seen today with onset about a week and a half ago of right periscapular pain.  This did follow lifting some heavy objects but she does not recall specific injury.  She then had hysteroscopy last week and thinks this may have exacerbated this pain possibly due to position she was in with her procedure.  Her pain is right periscapular region.  No neck pain.  No shoulder pain.  Denies any cough, fever, dyspnea, or pleuritic pain.   She tried yoga exercises, ibuprofen, heat, and topical sports cream type ointment without much improvement.  Pain has been worsening.  Does seem to be worse with change of position and movement.   She has past medical history significant for breast cancer and is on chronic tamoxifen.  PAIN:  Are you having pain? Yes: NPRS scale: 9/10 Pain location: R posterior shoulder  Pain description: ache Aggravating factors: prolonged positions, driving, desk work Relieving factors: heat, rest  PRECAUTIONS: None  RED FLAGS: None     WEIGHT BEARING RESTRICTIONS: No  FALLS:  Has patient fallen in last 6 months? No  OCCUPATION: office work  PLOF: Independent  PATIENT GOALS: To manage  my pain  NEXT MD VISIT: TBD  OBJECTIVE:  Note: Objective measures were completed at Evaluation unless otherwise noted.  DIAGNOSTIC FINDINGS:  IMPRESSION: 1. No acute cardiopulmonary disease. Specifically, no evidence of pneumonia. 2. Mild pectus deformity as demonstrated on cardiac CT performed 11/17/2022.     Electronically Signed   By: Simonne Come M.D.   On: 03/18/2023 16:34  PATIENT SURVEYS:  FOTO 37(66 predicted)  POSTURE: rounded shoulders, forward head, and increased thoracic kyphosis  PALPATION: TTP R thoracic paraspinals, teres major/minor    CERVICAL ROM: WNL throughout  Active ROM A/PROM (deg) eval  Flexion   Extension   Right lateral flexion   Left lateral flexion   Right rotation   Left rotation    (Blank rows = not tested)  UPPER EXTREMITY ROM: WNL throughout  Active ROM Right eval Left eval  Shoulder flexion    Shoulder extension    Shoulder abduction    Shoulder adduction    Shoulder extension    Shoulder internal rotation    Shoulder external rotation    Elbow flexion    Elbow extension    Wrist flexion    Wrist extension    Wrist ulnar deviation    Wrist radial deviation    Wrist pronation    Wrist supination     (Blank rows = not tested)  UPPER EXTREMITY MMT:  MMT Right eval Left eval  Shoulder flexion    Shoulder extension    Shoulder abduction    Shoulder adduction    Shoulder extension    Shoulder internal rotation    Shoulder external rotation    Middle trapezius    Lower trapezius    Elbow flexion    Elbow extension    Wrist flexion    Wrist extension    Wrist ulnar deviation    Wrist radial deviation    Wrist pronation    Wrist supination    Grip strength     (Blank rows = not tested)  CERVICAL SPECIAL TESTS:  Upper limb tension test (ULTT): Negative  FUNCTIONAL TESTS:  30 seconds chair stand test n/a  TODAY'S TREATMENT:  [facility] Adult PT Treatment:                                                 DATE: 03/30/23 Therapeutic Exercise: Nustep L4 6 min R UT stretch 30s x2 R levator stretch 30s x2  Manual Therapy: Skilled palpation to identify taught and irritable bands in R UT and levator, teres major Trigger Point Dry Needling Treatment: Pre-treatment instruction: Patient instructed on dry needling rationale, procedures, and possible side effects including pain during treatment (achy,cramping feeling), bruising, drop of blood, lightheadedness, nausea, sweating. Patient Consent Given: Yes Education handout provided: Yes Muscles treated: R UT and levator  Needle size and number: .25x75mm x 1 levator and UT, .30x38mm x1 Teres  major Electrical stimulation performed: No Parameters: N/A Treatment response/outcome: Twitch response elicited and Palpable decrease in muscle tension Post-treatment instructions: Patient instructed to expect possible mild to moderate muscle soreness later today and/or tomorrow. Patient instructed in methods to reduce muscle soreness and to continue prescribed HEP. If patient was dry needled over the lung field, patient was instructed on signs and symptoms of pneumothorax and, however unlikely, to see immediate medical attention should they occur. Patient was also educated on signs and symptoms of infection and to seek medical attention should they occur. Patient verbalized understanding of these instructions and education.    [facility] Adult PT Treatment:                                                DATE: 03/28/23 Therapeutic Exercise: R UT stretch 30s x2 Prone R shoulder extension 500g 15x Prone R shoulder flexion 500g 15x Prone R shoulder row 500g 15x Manual Therapy: Skilled palpation to identify taught and irritable bands in R teres major, infraspinatus and UT. Trigger Point Dry Needling Treatment: Pre-treatment instruction: Patient instructed on dry needling rationale, procedures, and possible side effects including pain during treatment (achy,cramping  feeling), bruising, drop of blood, lightheadedness, nausea, sweating. Patient Consent Given: Yes Education handout provided: Yes Muscles treated: R teres major, infraspinatus and UT  Needle size and number: .30x51mm x 1, UT and .25x47mm x 2 infraspinatus and teres major Electrical stimulation performed: No Parameters: N/A Treatment response/outcome: Twitch response elicited Post-treatment instructions: Patient instructed to expect possible mild to moderate muscle soreness later today and/or tomorrow. Patient instructed in methods to reduce muscle soreness and to continue prescribed HEP. If patient was dry needled over the lung field, patient was instructed on signs and symptoms of pneumothorax and, however unlikely, to see immediate medical attention should they occur. Patient was also educated on signs and symptoms of infection and to seek medical attention should they occur. Patient verbalized understanding of these instructions and education.      [facility] Adult PT Treatment:                                                DATE: 03/23/23 Therapeutic Exercise: R UT stretch 30s x2 R levator stretch 30s x2 Hor abd YTB 15x ER YTB 15x Nustep L2 6 min Manual Therapy: Skilled palpation to identify taught and irritable bands in  R thoracic paraspinals T6-7 region, R levator scap, R infraspinatus Trigger Point Dry Needling Treatment: Pre-treatment instruction: Patient instructed on dry needling rationale, procedures, and possible side effects including pain during treatment (achy,cramping feeling), bruising, drop of blood, lightheadedness, nausea, sweating. Patient Consent Given: Yes Education handout provided: Yes Muscles treated: R thoracic paraspinals T6-7 region, R levator scap, R infraspinatus  Needle size and number: .25x64mm x 3 Electrical stimulation performed: No Parameters: N/A Treatment response/outcome: Palpable decrease in muscle tension and decreased discomfort Post-treatment  instructions: Patient instructed to expect possible mild to moderate muscle soreness later today and/or tomorrow. Patient instructed in methods to reduce muscle soreness and to continue prescribed HEP. If patient was dry needled over the lung field, patient was instructed on signs and symptoms of pneumothorax and, however unlikely, to see immediate medical attention should they occur. Patient was also educated on signs and symptoms of infection and to seek medical attention should they occur. Patient verbalized understanding of these instructions and education.                                                                                                                DATE: 03/21/23 Eval and  HEP  PATIENT EDUCATION:  Education details: Discussed eval findings, rehab rationale and POC and patient is in agreement  Person educated: Patient Education method: Explanation Education comprehension: verbalized understanding and needs further education  HOME EXERCISE PROGRAM: Access Code: [email or web address] URL: [email or web address] Date: 03/21/2023 Prepared by: Gustavus Bryant  Exercises - Shoulder External Rotation and Scapular Retraction with Resistance  - 2 x daily - 5 x weekly - 2 sets - 10 reps - 2s hold - Standing Shoulder Horizontal Abduction with Resistance  - 2 x daily - 5 x weekly - 2 sets - 10 reps - 2s hold - Standing Shoulder Posterior Capsule Stretch  - 2 x daily - 5 x weekly - 1 sets - 2 reps - 30s hold  ASSESSMENT:  CLINICAL IMPRESSION: Continues to demo a good response to TPDN after last session.  Still needs to use MH for relief at times.  Focus of TPDN shifted to R UT and levator regions.  Followed needling with aerobic work, stretching and assessment of shoulder ROM.  Pec minor release performed due to palpable tightness which allowed more fluid shoulder mobility in IR/ER.  Patient is a 57 y.o. female who was seen today for physical therapy evaluation and treatment for  posterior R shoulder pain. Patient presents with full cervical and BUE AROM.  Postural changes include rounded shoulders and kyphotic posture.  Palpation finds tenderness in thoracic paraspinals and teres groups presenting as myofascial trigger points.  Patient is s good candidate for OPPT to include TPDN.  OBJECTIVE IMPAIRMENTS: decreased activity tolerance, decreased knowledge of condition, increased fascial restrictions, increased muscle spasms, impaired UE functional use, postural dysfunction, and pain.   ACTIVITY LIMITATIONS: carrying, lifting, sitting, sleeping, and computer work  PERSONAL FACTORS: Age, Education, and Fitness are also affecting patient's functional outcome.   REHAB POTENTIAL: Good  CLINICAL  DECISION MAKING: Stable/uncomplicated  EVALUATION COMPLEXITY: Low   GOALS: Goals reviewed with patient? No  SHORT TERM GOALS=LONG TERM GOALS: Target date: 05/02/2023    Patient to demonstrate independence in HEP  Baseline: [id number] Goal status: INITIAL  2.  Patient will acknowledge 4/10 pain at least once during episode of care   Baseline: 9/10 Goal status: INITIAL  3.  Patient will score at least 77% on FOTO to signify clinically meaningful improvement in functional abilities.   Baseline: 36 Goal status: INITIAL  4.  Decrease TTP in affected regions to minimal Baseline: Moderate to severe Goal status: INITIAL     PLAN:  PT FREQUENCY: 1-2x/week  PT DURATION: 6 weeks  PLANNED INTERVENTIONS: [id number]- PT Re-evaluation, 97110-Therapeutic exercises, 97530- Therapeutic activity, 97112- Neuromuscular re-education, 97535- Self Care, 54098- Manual therapy, 97032- Electrical stimulation (manual), Dry Needling, Joint mobilization, Spinal mobilization, Cryotherapy, and Moist heat  PLAN FOR NEXT SESSION: HEP review and update, manual techniques as appropriate, aerobic tasks, ROM and flexibility activities, strengthening and PREs, TPDN, gait and balance training as needed      Hildred Laser, PT 03/30/2023, 2:34 PM

## 2023-04-01 NOTE — Therapy (Unsigned)
[facility] OUTPATIENT PHYSICAL THERAPY CERVICAL EVALUATION   Patient Name: Megan Armstrong MRN: [id number] DOB:1965/08/14, 57 y.o., female Today's Date: 04/02/2023  END OF SESSION:  PT End of Session - 04/02/23 0753     Visit Number 5    Number of Visits 8    Date for PT Re-Evaluation 05/22/23    Authorization Type cigna    PT Start Time [phone]    PT Stop Time 0825    PT Time Calculation (min) 30 min    Activity Tolerance Patient tolerated treatment well    Behavior During Therapy WFL for tasks assessed/performed                Past Medical History:  Diagnosis Date   Allergic rhinitis    Endometrial hyperplasia    Endometriosis    Family history of breast cancer    Family history of breast cancer    Hx of endometriosis    rectal vag with laser surgery    Seasonal allergies    TB skin/subcutaneous    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   Past Surgical History:  Procedure Laterality Date   DIAGNOSTIC LAPAROSCOPY     MASTECTOMY W/ SENTINEL NODE BIOPSY Bilateral 09/16/2018   Procedure: RIGHT MASTECTOMY WITH RIGHT AXILLARY SENTINEL LYMPH NODE BIOPSY AND LEFT RISK REDUCING MASTECTOMY;  Surgeon: Emelia Loron, MD;  Location: [facility];  Service: General;  Laterality: Bilateral;   Patient Active Problem List   Diagnosis Date Noted   Family history of cardiovascular disorder 09/12/2022   Breast cancer, right (HCC) 09/16/2018   Genetic testing 09/09/2018   Malignant neoplasm of upper-inner quadrant of right breast in female, estrogen receptor positive (HCC) 08/19/2018   Family history of breast cancer    History of penicillin allergy 09/24/2012   Exposure to strep throat 09/24/2012   Perioral dermatitis 09/24/2012   Left otitis media with effusion 08/16/2011   Hx of antibiotic allergy 05/03/2011   Disorder of teeth and supporting structures 03/30/2008   Allergic rhinitis 02/17/2008   Endometriosis 02/17/2008   Headache  02/17/2008   Hx gestational diabetes 02/17/2008   POSITIVE PPD 12/17/2007    PCP: Madelin Headings, MD   REFERRING PROVIDER: Kristian Covey, MD  REFERRING DIAG: M54.9 (ICD-10-CM) - Upper back pain on right side  THERAPY DIAG:  Pain in thoracic spine  Abnormal posture  Rationale for Evaluation and Treatment: Rehabilitation  ONSET DATE: 2 weeks  SUBJECTIVE:  SUBJECTIVE STATEMENT: Symptoms at 4/10, still notes issues with sleep positions. Hand dominance: Right  PERTINENT HISTORY:  Seen today with onset about a week and a half ago of right periscapular pain.  This did follow lifting some heavy objects but she does not recall specific injury.  She then had hysteroscopy last week and thinks this may have exacerbated this pain possibly due to position she was in with her procedure.  Her pain is right periscapular region.  No neck pain.  No shoulder pain.  Denies any cough, fever, dyspnea, or pleuritic pain.   She tried yoga exercises, ibuprofen, heat, and topical sports cream type ointment without much improvement.  Pain has been worsening.  Does seem to be worse with change of position and movement.   She has past medical history significant for breast cancer and is on chronic tamoxifen.  PAIN:  Are you having pain? Yes: NPRS scale: 9/10 Pain location: R posterior shoulder  Pain description: ache Aggravating factors: prolonged positions, driving, desk work Relieving factors: heat, rest  PRECAUTIONS: None  RED FLAGS: None     WEIGHT BEARING RESTRICTIONS: No  FALLS:  Has patient fallen in last 6 months? No  OCCUPATION: office work  PLOF: Independent  PATIENT GOALS: To manage my pain  NEXT MD VISIT: TBD  OBJECTIVE:  Note: Objective measures were completed at Evaluation  unless otherwise noted.  DIAGNOSTIC FINDINGS:  IMPRESSION: 1. No acute cardiopulmonary disease. Specifically, no evidence of pneumonia. 2. Mild pectus deformity as demonstrated on cardiac CT performed 11/17/2022.     Electronically Signed   By: Simonne Come M.D.   On: 03/18/2023 16:34  PATIENT SURVEYS:  FOTO 37(66 predicted)  POSTURE: rounded shoulders, forward head, and increased thoracic kyphosis  PALPATION: TTP R thoracic paraspinals, teres major/minor    CERVICAL ROM: WNL throughout  Active ROM A/PROM (deg) eval  Flexion   Extension   Right lateral flexion   Left lateral flexion   Right rotation   Left rotation    (Blank rows = not tested)  UPPER EXTREMITY ROM: WNL throughout  Active ROM Right eval Left eval  Shoulder flexion    Shoulder extension    Shoulder abduction    Shoulder adduction    Shoulder extension    Shoulder internal rotation    Shoulder external rotation    Elbow flexion    Elbow extension    Wrist flexion    Wrist extension    Wrist ulnar deviation    Wrist radial deviation    Wrist pronation    Wrist supination     (Blank rows = not tested)  UPPER EXTREMITY MMT:  MMT Right eval Left eval  Shoulder flexion    Shoulder extension    Shoulder abduction    Shoulder adduction    Shoulder extension    Shoulder internal rotation    Shoulder external rotation    Middle trapezius    Lower trapezius    Elbow flexion    Elbow extension    Wrist flexion    Wrist extension    Wrist ulnar deviation    Wrist radial deviation    Wrist pronation    Wrist supination    Grip strength     (Blank rows = not tested)  CERVICAL SPECIAL TESTS:  Upper limb tension test (ULTT): Negative  FUNCTIONAL TESTS:  30 seconds chair stand test n/a  TODAY'S TREATMENT:        [facility] Adult PT Treatment:  DATE: 04/02/23 Therapeutic Exercise: Egbert Garibaldi dog 10/10 Cat/cow 10x Open book 10/10 Manual  Therapy: Skilled palpation to identify taught and irritable bands in R iliocostalis b/t T12-8  Trigger Point Dry Needling Treatment: Pre-treatment instruction: Patient instructed on dry needling rationale, procedures, and possible side effects including pain during treatment (achy,cramping feeling), bruising, drop of blood, lightheadedness, nausea, sweating. Patient Consent Given: Yes Education handout provided: Yes Muscles treated: R iliocostalis b/t T 12-8  Needle size and number:  .30 x 30mm 3 Electrical stimulation performed: No Parameters: N/A Treatment response/outcome: Twitch response elicited and Palpable decrease in muscle tension Post-treatment instructions: Patient instructed to expect possible mild to moderate muscle soreness later today and/or tomorrow. Patient instructed in methods to reduce muscle soreness and to continue prescribed HEP. If patient was dry needled over the lung field, patient was instructed on signs and symptoms of pneumothorax and, however unlikely, to see immediate medical attention should they occur. Patient was also educated on signs and symptoms of infection and to seek medical attention should they occur. Patient verbalized understanding of these instructions and education.    [facility] Adult PT Treatment:                                                DATE: 03/30/23 Therapeutic Exercise: Nustep L4 6 min R UT stretch 30s x2 R levator stretch 30s x2  Manual Therapy: Skilled palpation to identify taught and irritable bands in R UT and levator, teres major Trigger Point Dry Needling Treatment: Pre-treatment instruction: Patient instructed on dry needling rationale, procedures, and possible side effects including pain during treatment (achy,cramping feeling), bruising, drop of blood, lightheadedness, nausea, sweating. Patient Consent Given: Yes Education handout provided: Yes Muscles treated: R UT and levator  Needle size and number: .25x48mm x 1 levator and  UT, .30x66mm x1 Teres  major Electrical stimulation performed: No Parameters: N/A Treatment response/outcome: Twitch response elicited and Palpable decrease in muscle tension Post-treatment instructions: Patient instructed to expect possible mild to moderate muscle soreness later today and/or tomorrow. Patient instructed in methods to reduce muscle soreness and to continue prescribed HEP. If patient was dry needled over the lung field, patient was instructed on signs and symptoms of pneumothorax and, however unlikely, to see immediate medical attention should they occur. Patient was also educated on signs and symptoms of infection and to seek medical attention should they occur. Patient verbalized understanding of these instructions and education.    [facility] Adult PT Treatment:                                                DATE: 03/28/23 Therapeutic Exercise: R UT stretch 30s x2 Prone R shoulder extension 500g 15x Prone R shoulder flexion 500g 15x Prone R shoulder row 500g 15x Manual Therapy: Skilled palpation to identify taught and irritable bands in R teres major, infraspinatus and UT. Trigger Point Dry Needling Treatment: Pre-treatment instruction: Patient instructed on dry needling rationale, procedures, and possible side effects including pain during treatment (achy,cramping feeling), bruising, drop of blood, lightheadedness, nausea, sweating. Patient Consent Given: Yes Education handout provided: Yes Muscles treated: R teres major, infraspinatus and UT  Needle size and number: .30x80mm x 1, UT and .25x13mm x 2 infraspinatus and teres  major Electrical stimulation performed: No Parameters: N/A Treatment response/outcome: Twitch response elicited Post-treatment instructions: Patient instructed to expect possible mild to moderate muscle soreness later today and/or tomorrow. Patient instructed in methods to reduce muscle soreness and to continue prescribed HEP. If patient was dry needled over  the lung field, patient was instructed on signs and symptoms of pneumothorax and, however unlikely, to see immediate medical attention should they occur. Patient was also educated on signs and symptoms of infection and to seek medical attention should they occur. Patient verbalized understanding of these instructions and education.      [facility] Adult PT Treatment:                                                DATE: 03/23/23 Therapeutic Exercise: R UT stretch 30s x2 R levator stretch 30s x2 Hor abd YTB 15x ER YTB 15x Nustep L2 6 min Manual Therapy: Skilled palpation to identify taught and irritable bands in R thoracic paraspinals T6-7 region, R levator scap, R infraspinatus Trigger Point Dry Needling Treatment: Pre-treatment instruction: Patient instructed on dry needling rationale, procedures, and possible side effects including pain during treatment (achy,cramping feeling), bruising, drop of blood, lightheadedness, nausea, sweating. Patient Consent Given: Yes Education handout provided: Yes Muscles treated: R thoracic paraspinals T6-7 region, R levator scap, R infraspinatus using shelf technique Needle size and number: .25x45mm x 3 Electrical stimulation performed: No Parameters: N/A Treatment response/outcome: Palpable decrease in muscle tension and decreased discomfort Post-treatment instructions: Patient instructed to expect possible mild to moderate muscle soreness later today and/or tomorrow. Patient instructed in methods to reduce muscle soreness and to continue prescribed HEP. If patient was dry needled over the lung field, patient was instructed on signs and symptoms of pneumothorax and, however unlikely, to see immediate medical attention should they occur. Patient was also educated on signs and symptoms of infection and to seek medical attention should they occur. Patient verbalized understanding of these instructions and education.                                                                                                                 DATE: 03/21/23 Eval and  HEP  PATIENT EDUCATION:  Education details: Discussed eval findings, rehab rationale and POC and patient is in agreement  Person educated: Patient Education method: Explanation Education comprehension: verbalized understanding and needs further education  HOME EXERCISE PROGRAM: Access Code: [email or web address] URL: [email or web address] Date: 03/21/2023 Prepared by: Gustavus Bryant  Exercises - Shoulder External Rotation and Scapular Retraction with Resistance  - 2 x daily - 5 x weekly - 2 sets - 10 reps - 2s hold - Standing Shoulder Horizontal Abduction with Resistance  - 2 x daily - 5 x weekly - 2 sets - 10 reps - 2s hold - Standing Shoulder Posterior Capsule Stretch  - 2 x daily - 5 x weekly - 1 sets - 2  reps - 30s hold  ASSESSMENT:  CLINICAL IMPRESSION: Focus of TPDN moved to R thoracic paraspinal region as palpation to periscapular region unremarkable.  Suspected rib dysfunction as PA mobilizations to R mid thoracic ribs elicited symptoms.  Shelf technique used to avoid lung field.  Followed needle technique with spinal stabilization and stretching tasks.    Patient is a 57 y.o. female who was seen today for physical therapy evaluation and treatment for posterior R shoulder pain. Patient presents with full cervical and BUE AROM.  Postural changes include rounded shoulders and kyphotic posture.  Palpation finds tenderness in thoracic paraspinals and teres groups presenting as myofascial trigger points.  Patient is s good candidate for OPPT to include TPDN.  OBJECTIVE IMPAIRMENTS: decreased activity tolerance, decreased knowledge of condition, increased fascial restrictions, increased muscle spasms, impaired UE functional use, postural dysfunction, and pain.   ACTIVITY LIMITATIONS: carrying, lifting, sitting, sleeping, and computer work  PERSONAL FACTORS: Age, Education, and Fitness are also affecting  patient's functional outcome.   REHAB POTENTIAL: Good  CLINICAL DECISION MAKING: Stable/uncomplicated  EVALUATION COMPLEXITY: Low   GOALS: Goals reviewed with patient? No  SHORT TERM GOALS=LONG TERM GOALS: Target date: 05/02/2023    Patient to demonstrate independence in HEP  Baseline: [id number] Goal status: MEt  2.  Patient will acknowledge 4/10 pain at least once during episode of care   Baseline: 9/10; 04/02/23 4/10 pain Goal status: Met  3.  Patient will score at least 77% on FOTO to signify clinically meaningful improvement in functional abilities.   Baseline: 36 Goal status: INITIAL  4.  Decrease TTP in affected regions to minimal Baseline: Moderate to severe Goal status: Ongoing     PLAN:  PT FREQUENCY: 1-2x/week  PT DURATION: 6 weeks  PLANNED INTERVENTIONS: 97164- PT Re-evaluation, 97110-Therapeutic exercises, 97530- Therapeutic activity, 97112- Neuromuscular re-education, 97535- Self Care, [id number]- Manual therapy, 97032- Electrical stimulation (manual), Dry Needling, Joint mobilization, Spinal mobilization, Cryotherapy, and Moist heat  PLAN FOR NEXT SESSION: HEP review and update, manual techniques as appropriate, aerobic tasks, ROM and flexibility activities, strengthening and PREs, TPDN, gait and balance training as needed     Hildred Laser, PT 04/02/2023, 8:35 AM

## 2023-04-02 ENCOUNTER — Ambulatory Visit: Payer: Managed Care, Other (non HMO)

## 2023-04-02 DIAGNOSIS — M546 Pain in thoracic spine: Secondary | ICD-10-CM

## 2023-04-02 DIAGNOSIS — R293 Abnormal posture: Secondary | ICD-10-CM

## 2023-04-03 ENCOUNTER — Ambulatory Visit (HOSPITAL_COMMUNITY)
Admission: RE | Admit: 2023-04-03 | Discharge: 2023-04-03 | Disposition: A | Discharge status: Home / Self Care | Payer: Managed Care, Other (non HMO) | Source: Ambulatory Visit | Attending: Hematology and Oncology | Admitting: Hematology and Oncology

## 2023-04-03 ENCOUNTER — Encounter (HOSPITAL_COMMUNITY): Payer: Self-pay

## 2023-04-03 DIAGNOSIS — R0789 Other chest pain: Secondary | ICD-10-CM | POA: Insufficient documentation

## 2023-04-03 DIAGNOSIS — C50911 Malignant neoplasm of unspecified site of right female breast: Secondary | ICD-10-CM | POA: Diagnosis present

## 2023-04-03 LAB — POCT I-STAT CREATININE: Creatinine, Ser: 0.9 mg/dL (ref 0.44–1.00)

## 2023-04-03 MED ORDER — IOHEXOL 300 MG/ML  SOLN
75.0000 mL | Freq: Once | INTRAMUSCULAR | Status: AC | PRN
  Administered 2023-04-03: 75 mL via INTRAVENOUS

## 2023-04-05 NOTE — Therapy (Signed)
[facility] OUTPATIENT PHYSICAL THERAPY TREATMENT NOTE   Patient Name: Megan Armstrong MRN: [id number] DOB:04/26/1965, 57 y.o., female Today's Date: 04/06/2023  END OF SESSION:  PT End of Session - 04/06/23 0754     Visit Number 6    Number of Visits 8    Date for PT Re-Evaluation 05/22/23    Authorization Type cigna    PT Start Time [phone]   late for appointment   PT Stop Time 0825    PT Time Calculation (min) 30 min    Activity Tolerance Patient tolerated treatment well    Behavior During Therapy [facility] for tasks assessed/performed                 Past Medical History:  Diagnosis Date   Allergic rhinitis    Endometrial hyperplasia    Endometriosis    Family history of breast cancer    Family history of breast cancer    Hx of endometriosis    rectal vag with laser surgery    Seasonal allergies    TB skin/subcutaneous    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   Past Surgical History:  Procedure Laterality Date   DIAGNOSTIC LAPAROSCOPY     MASTECTOMY W/ SENTINEL NODE BIOPSY Bilateral 09/16/2018   Procedure: RIGHT MASTECTOMY WITH RIGHT AXILLARY SENTINEL LYMPH NODE BIOPSY AND LEFT RISK REDUCING MASTECTOMY;  Surgeon: Emelia Loron, MD;  Location: [facility];  Service: General;  Laterality: Bilateral;   Patient Active Problem List   Diagnosis Date Noted   Family history of cardiovascular disorder 09/12/2022   Breast cancer, right (HCC) 09/16/2018   Genetic testing 09/09/2018   Malignant neoplasm of upper-inner quadrant of right breast in female, estrogen receptor positive (HCC) 08/19/2018   Family history of breast cancer    History of penicillin allergy 09/24/2012   Exposure to strep throat 09/24/2012   Perioral dermatitis 09/24/2012   Left otitis media with effusion 08/16/2011   Hx of antibiotic allergy 05/03/2011   Disorder of teeth and supporting structures 03/30/2008   Allergic rhinitis 02/17/2008   Endometriosis 02/17/2008    Headache 02/17/2008   Hx gestational diabetes 02/17/2008   POSITIVE PPD 12/17/2007    PCP: Madelin Headings, MD   REFERRING PROVIDER: Kristian Covey, MD  REFERRING DIAG: M54.9 (ICD-10-CM) - Upper back pain on right side  THERAPY DIAG:  Pain in thoracic spine  Abnormal posture  Muscle weakness (generalized)  Rationale for Evaluation and Treatment: Rehabilitation  ONSET DATE: 2 weeks  SUBJECTIVE:  SUBJECTIVE STATEMENT: Continues to report less need of moist heat, only needing heat at night. Pain awakens her at time. Hand dominance: Right  PERTINENT HISTORY:  Seen today with onset about a week and a half ago of right periscapular pain.  This did follow lifting some heavy objects but she does not recall specific injury.  She then had hysteroscopy last week and thinks this may have exacerbated this pain possibly due to position she was in with her procedure.  Her pain is right periscapular region.  No neck pain.  No shoulder pain.  Denies any cough, fever, dyspnea, or pleuritic pain.   She tried yoga exercises, ibuprofen, heat, and topical sports cream type ointment without much improvement.  Pain has been worsening.  Does seem to be worse with change of position and movement.   She has past medical history significant for breast cancer and is on chronic tamoxifen.  PAIN:  Are you having pain? Yes: NPRS scale: 9/10 Pain location: R posterior shoulder  Pain description: ache Aggravating factors: prolonged positions, driving, desk work Relieving factors: heat, rest  PRECAUTIONS: None  RED FLAGS: None     WEIGHT BEARING RESTRICTIONS: No  FALLS:  Has patient fallen in last 6 months? No  OCCUPATION: office work  PLOF: Independent  PATIENT GOALS: To manage my  pain  NEXT MD VISIT: TBD  OBJECTIVE:  Note: Objective measures were completed at Evaluation unless otherwise noted.  DIAGNOSTIC FINDINGS:  IMPRESSION: 1. No acute cardiopulmonary disease. Specifically, no evidence of pneumonia. 2. Mild pectus deformity as demonstrated on cardiac CT performed 11/17/2022.     Electronically Signed   By: Simonne Come M.D.   On: 03/18/2023 16:34  PATIENT SURVEYS:  FOTO 37(66 predicted)  POSTURE: rounded shoulders, forward head, and increased thoracic kyphosis  PALPATION: TTP R thoracic paraspinals, teres major/minor    CERVICAL ROM: WNL throughout  Active ROM A/PROM (deg) eval  Flexion   Extension   Right lateral flexion   Left lateral flexion   Right rotation   Left rotation    (Blank rows = not tested)  UPPER EXTREMITY ROM: WNL throughout  Active ROM Right eval Left eval  Shoulder flexion    Shoulder extension    Shoulder abduction    Shoulder adduction    Shoulder extension    Shoulder internal rotation    Shoulder external rotation    Elbow flexion    Elbow extension    Wrist flexion    Wrist extension    Wrist ulnar deviation    Wrist radial deviation    Wrist pronation    Wrist supination     (Blank rows = not tested)  UPPER EXTREMITY MMT:  MMT Right eval Left eval  Shoulder flexion    Shoulder extension    Shoulder abduction    Shoulder adduction    Shoulder extension    Shoulder internal rotation    Shoulder external rotation    Middle trapezius    Lower trapezius    Elbow flexion    Elbow extension    Wrist flexion    Wrist extension    Wrist ulnar deviation    Wrist radial deviation    Wrist pronation    Wrist supination    Grip strength     (Blank rows = not tested)  CERVICAL SPECIAL TESTS:  Upper limb tension test (ULTT): Negative  FUNCTIONAL TESTS:  30 seconds chair stand test n/a  TODAY'S TREATMENT:    [facility] Adult PT Treatment:  DATE:  04/06/23 Therapeutic Exercise: Doorway stretch 30s x 3 Manual Therapy: Skilled palpation to identify taught and irritable bands in B pec major/minor  Trigger Point Dry Needling Treatment: Pre-treatment instruction: Patient instructed on dry needling rationale, procedures, and possible side effects including pain during treatment (achy,cramping feeling), bruising, drop of blood, lightheadedness, nausea, sweating. Patient Consent Given: Yes Education handout provided: Previously provided Muscles treated: B pec major/minor, direct approach Needle size and number: .25x61mm x 2, .30x67mm x 2 Electrical stimulation performed: No Parameters: N/A Treatment response/outcome: Twitch response elicited and Palpable decrease in muscle tension Post-treatment instructions: Patient instructed to expect possible mild to moderate muscle soreness later today and/or tomorrow. Patient instructed in methods to reduce muscle soreness and to continue prescribed HEP. If patient was dry needled over the lung field, patient was instructed on signs and symptoms of pneumothorax and, however unlikely, to see immediate medical attention should they occur. Patient was also educated on signs and symptoms of infection and to seek medical attention should they occur. Patient verbalized understanding of these instructions and education.        [facility] Adult PT Treatment:                                                DATE: 04/02/23 Therapeutic Exercise: Egbert Garibaldi dog 10/10 Cat/cow 10x Open book 10/10 Manual Therapy: Skilled palpation to identify taught and irritable bands in R iliocostalis b/t T12-8  Trigger Point Dry Needling Treatment: Pre-treatment instruction: Patient instructed on dry needling rationale, procedures, and possible side effects including pain during treatment (achy,cramping feeling), bruising, drop of blood, lightheadedness, nausea, sweating. Patient Consent Given: Yes Education handout provided: Yes Muscles  treated: R iliocostalis b/t T 12-8  Needle size and number:  .30 x 30mm 3 Electrical stimulation performed: No Parameters: N/A Treatment response/outcome: Twitch response elicited and Palpable decrease in muscle tension Post-treatment instructions: Patient instructed to expect possible mild to moderate muscle soreness later today and/or tomorrow. Patient instructed in methods to reduce muscle soreness and to continue prescribed HEP. If patient was dry needled over the lung field, patient was instructed on signs and symptoms of pneumothorax and, however unlikely, to see immediate medical attention should they occur. Patient was also educated on signs and symptoms of infection and to seek medical attention should they occur. Patient verbalized understanding of these instructions and education.    [facility] Adult PT Treatment:                                                DATE: 03/30/23 Therapeutic Exercise: Nustep L4 6 min R UT stretch 30s x2 R levator stretch 30s x2  Manual Therapy: Skilled palpation to identify taught and irritable bands in R UT and levator, teres major Trigger Point Dry Needling Treatment: Pre-treatment instruction: Patient instructed on dry needling rationale, procedures, and possible side effects including pain during treatment (achy,cramping feeling), bruising, drop of blood, lightheadedness, nausea, sweating. Patient Consent Given: Yes Education handout provided: Yes Muscles treated: R UT and levator  Needle size and number: .25x61mm x 1 levator and UT, .30x2mm x1 Teres  major Electrical stimulation performed: No Parameters: N/A Treatment response/outcome: Twitch response elicited and Palpable decrease in muscle tension Post-treatment instructions: Patient instructed to  expect possible mild to moderate muscle soreness later today and/or tomorrow. Patient instructed in methods to reduce muscle soreness and to continue prescribed HEP. If patient was dry needled over the lung  field, patient was instructed on signs and symptoms of pneumothorax and, however unlikely, to see immediate medical attention should they occur. Patient was also educated on signs and symptoms of infection and to seek medical attention should they occur. Patient verbalized understanding of these instructions and education.    [facility] Adult PT Treatment:                                                DATE: 03/28/23 Therapeutic Exercise: R UT stretch 30s x2 Prone R shoulder extension 500g 15x Prone R shoulder flexion 500g 15x Prone R shoulder row 500g 15x Manual Therapy: Skilled palpation to identify taught and irritable bands in R teres major, infraspinatus and UT. Trigger Point Dry Needling Treatment: Pre-treatment instruction: Patient instructed on dry needling rationale, procedures, and possible side effects including pain during treatment (achy,cramping feeling), bruising, drop of blood, lightheadedness, nausea, sweating. Patient Consent Given: Yes Education handout provided: Yes Muscles treated: R teres major, infraspinatus and UT  Needle size and number: .30x31mm x 1, UT and .25x36mm x 2 infraspinatus and teres major Electrical stimulation performed: No Parameters: N/A Treatment response/outcome: Twitch response elicited Post-treatment instructions: Patient instructed to expect possible mild to moderate muscle soreness later today and/or tomorrow. Patient instructed in methods to reduce muscle soreness and to continue prescribed HEP. If patient was dry needled over the lung field, patient was instructed on signs and symptoms of pneumothorax and, however unlikely, to see immediate medical attention should they occur. Patient was also educated on signs and symptoms of infection and to seek medical attention should they occur. Patient verbalized understanding of these instructions and education.      [facility] Adult PT Treatment:                                                DATE:  03/23/23 Therapeutic Exercise: R UT stretch 30s x2 R levator stretch 30s x2 Hor abd YTB 15x ER YTB 15x Nustep L2 6 min Manual Therapy: Skilled palpation to identify taught and irritable bands in R thoracic paraspinals T6-7 region, R levator scap, R infraspinatus Trigger Point Dry Needling Treatment: Pre-treatment instruction: Patient instructed on dry needling rationale, procedures, and possible side effects including pain during treatment (achy,cramping feeling), bruising, drop of blood, lightheadedness, nausea, sweating. Patient Consent Given: Yes Education handout provided: Yes Muscles treated: R thoracic paraspinals T6-7 region, R levator scap, R infraspinatus using shelf technique Needle size and number: .25x31mm x 3 Electrical stimulation performed: No Parameters: N/A Treatment response/outcome: Palpable decrease in muscle tension and decreased discomfort Post-treatment instructions: Patient instructed to expect possible mild to moderate muscle soreness later today and/or tomorrow. Patient instructed in methods to reduce muscle soreness and to continue prescribed HEP. If patient was dry needled over the lung field, patient was instructed on signs and symptoms of pneumothorax and, however unlikely, to see immediate medical attention should they occur. Patient was also educated on signs and symptoms of infection and to seek medical attention should they occur. Patient verbalized understanding of these instructions and  education.                                                                                                                DATE: 03/21/23 Eval and  HEP  PATIENT EDUCATION:  Education details: Discussed eval findings, rehab rationale and POC and patient is in agreement  Person educated: Patient Education method: Explanation Education comprehension: verbalized understanding and needs further education  HOME EXERCISE PROGRAM: Access Code: [email or web address] URL:  [email or web address] Date: 04/06/2023 Prepared by: Gustavus Bryant  Exercises - Shoulder External Rotation and Scapular Retraction with Resistance  - 2 x daily - 5 x weekly - 2 sets - 10 reps - 2s hold - Standing Shoulder Horizontal Abduction with Resistance  - 2 x daily - 5 x weekly - 2 sets - 10 reps - 2s hold - Doorway Pec Stretch at 90 Degrees Abduction  - 2 x daily - 5 x weekly - 1 sets - 3 reps - 30s hold - Bird Dog  - 2 x daily - 5 x weekly - 1 sets - 10 reps - 3s hold - Cat Cow  - 2 x daily - 5 x weekly - 1 sets - 10 reps - 3s hold  ASSESSMENT:  CLINICAL IMPRESSION: Palpation finds minimal tenderness to posterior shoulders and thoracic spine.  TTP more prominent in anterior chest wall B especially pec major/minor.  Treatment focused on anterior chest wall to address soft tissue restrictions as well limitations to shoulder ER at 90/90 position.  Instructed in doorway stretching to address treated muscles.  HEP updated with new tasks as well as previous spinal mobility exercies  Patient is a 58 y.o. female who was seen today for physical therapy evaluation and treatment for posterior R shoulder pain. Patient presents with full cervical and BUE AROM.  Postural changes include rounded shoulders and kyphotic posture.  Palpation finds tenderness in thoracic paraspinals and teres groups presenting as myofascial trigger points.  Patient is s good candidate for OPPT to include TPDN.  OBJECTIVE IMPAIRMENTS: decreased activity tolerance, decreased knowledge of condition, increased fascial restrictions, increased muscle spasms, impaired UE functional use, postural dysfunction, and pain.   ACTIVITY LIMITATIONS: carrying, lifting, sitting, sleeping, and computer work  PERSONAL FACTORS: Age, Education, and Fitness are also affecting patient's functional outcome.   REHAB POTENTIAL: Good  CLINICAL DECISION MAKING: Stable/uncomplicated  EVALUATION COMPLEXITY: Low   GOALS: Goals  reviewed with patient? No  SHORT TERM GOALS=LONG TERM GOALS: Target date: 05/02/2023    Patient to demonstrate independence in HEP  Baseline: [id number] Goal status: MEt  2.  Patient will acknowledge 4/10 pain at least once during episode of care   Baseline: 9/10; 04/02/23 4/10 pain Goal status: Met  3.  Patient will score at least 77% on FOTO to signify clinically meaningful improvement in functional abilities.   Baseline: 36 Goal status: INITIAL  4.  Decrease TTP in affected regions to minimal Baseline: Moderate to severe Goal status: Ongoing     PLAN:  PT FREQUENCY: 1-2x/week  PT DURATION: 6  weeks  PLANNED INTERVENTIONS: 97164- PT Re-evaluation, 97110-Therapeutic exercises, 97530- Therapeutic activity, 97112- Neuromuscular re-education, 97535- Self Care, [id number]- Manual therapy, [phone]- Electrical stimulation (manual), Dry Needling, Joint mobilization, Spinal mobilization, Cryotherapy, and Moist heat  PLAN FOR NEXT SESSION: HEP review and update, manual techniques as appropriate, aerobic tasks, ROM and flexibility activities, strengthening and PREs, TPDN, gait and balance training as needed     Hildred Laser, PT 04/06/2023, 8:39 AM

## 2023-04-06 ENCOUNTER — Ambulatory Visit: Payer: Managed Care, Other (non HMO)

## 2023-04-06 DIAGNOSIS — M6281 Muscle weakness (generalized): Secondary | ICD-10-CM

## 2023-04-06 DIAGNOSIS — M546 Pain in thoracic spine: Secondary | ICD-10-CM

## 2023-04-06 DIAGNOSIS — R293 Abnormal posture: Secondary | ICD-10-CM

## 2023-04-12 NOTE — Therapy (Signed)
 [facility] TREATMENT NOTE   Patient Name: Megan Armstrong MRN: [id number] DOB:1965/08/04, 58 y.o., female Today's Date: 04/13/2023  END OF SESSION:  PT End of Session - 04/13/23 0841     Visit Number 7    Number of Visits 8    Date for PT Re-Evaluation 05/22/23    Authorization Type cigna    PT Start Time 0840   late for appointment   PT Stop Time 0910    PT Time Calculation (min) 30 min    Activity Tolerance Patient tolerated treatment well    Behavior During Therapy [facility] for tasks assessed/performed                  Past Medical History:  Diagnosis Date   Allergic rhinitis    Endometrial hyperplasia    Endometriosis    Family history of breast cancer    Family history of breast cancer    Hx of endometriosis    rectal vag with laser surgery    Seasonal allergies    TB skin/subcutaneous    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   Past Surgical History:  Procedure Laterality Date   DIAGNOSTIC LAPAROSCOPY     MASTECTOMY W/ SENTINEL NODE BIOPSY Bilateral 09/16/2018   Procedure: RIGHT MASTECTOMY WITH RIGHT AXILLARY SENTINEL LYMPH NODE BIOPSY AND LEFT RISK REDUCING MASTECTOMY;  Surgeon: Ebbie Cough, MD;  Location: [facility];  Service: General;  Laterality: Bilateral;   Patient Active Problem List   Diagnosis Date Noted   Family history of cardiovascular disorder 09/12/2022   Breast cancer, right (HCC) 09/16/2018   Genetic testing 09/09/2018   Malignant neoplasm of upper-inner quadrant of right breast in female, estrogen receptor positive (HCC) 08/19/2018   Family history of breast cancer    History of penicillin allergy 09/24/2012   Exposure to strep throat 09/24/2012   Perioral dermatitis 09/24/2012   Left otitis media with effusion 08/16/2011   Hx of antibiotic allergy 05/03/2011   Disorder of teeth and supporting structures 03/30/2008   Allergic rhinitis 02/17/2008   Endometriosis 02/17/2008    Headache 02/17/2008   Hx gestational diabetes 02/17/2008   POSITIVE PPD 12/17/2007    PCP: Charlett Apolinar POUR, MD   REFERRING PROVIDER: Micheal Wolm ORN, MD  REFERRING DIAG: M54.9 (ICD-10-CM) - Upper back pain on right side  THERAPY DIAG:  Pain in thoracic spine  Abnormal posture  Muscle weakness (generalized)  Rationale for Evaluation and Treatment: Rehabilitation  ONSET DATE: 2 weeks  SUBJECTIVE:  SUBJECTIVE STATEMENT: Reports symptom pattern changing.  Spent yesterday at her desk performing computer work and feels she tightened up Hand dominance: Right  PERTINENT HISTORY:  Seen today with onset about a week and a half ago of right periscapular pain.  This did follow lifting some heavy objects but she does not recall specific injury.  She then had hysteroscopy last week and thinks this may have exacerbated this pain possibly due to position she was in with her procedure.  Her pain is right periscapular region.  No neck pain.  No shoulder pain.  Denies any cough, fever, dyspnea, or pleuritic pain.   She tried yoga exercises, ibuprofen, heat, and topical sports cream type ointment without much improvement.  Pain has been worsening.  Does seem to be worse with change of position and movement.   She has past medical history significant for breast cancer and is on chronic tamoxifen .  PAIN:  Are you having pain? Yes: NPRS scale: 9/10 Pain location: R posterior shoulder  Pain description: ache Aggravating factors: prolonged positions, driving, desk work Relieving factors: heat, rest  PRECAUTIONS: None  RED FLAGS: None     WEIGHT BEARING RESTRICTIONS: No  FALLS:  Has patient fallen in last 6 months? No  OCCUPATION: office work  PLOF: Independent  PATIENT GOALS: To  manage my pain  NEXT MD VISIT: TBD  OBJECTIVE:  Note: Objective measures were completed at Evaluation unless otherwise noted.  DIAGNOSTIC FINDINGS:  IMPRESSION: 1. No acute cardiopulmonary disease. Specifically, no evidence of pneumonia. 2. Mild pectus deformity as demonstrated on cardiac CT performed 11/17/2022.     Electronically Signed   By: Norleen Roulette M.D.   On: 03/18/2023 16:34  PATIENT SURVEYS:  FOTO 37(66 predicted)  04/13/23 52%  POSTURE: rounded shoulders, forward head, and increased thoracic kyphosis  PALPATION: TTP R thoracic paraspinals, teres major/minor    CERVICAL ROM: WNL throughout  Active ROM A/PROM (deg) eval  Flexion   Extension   Right lateral flexion   Left lateral flexion   Right rotation   Left rotation    (Blank rows = not tested)  UPPER EXTREMITY ROM: WNL throughout  Active ROM Right eval Left eval  Shoulder flexion    Shoulder extension    Shoulder abduction    Shoulder adduction    Shoulder extension    Shoulder internal rotation    Shoulder external rotation    Elbow flexion    Elbow extension    Wrist flexion    Wrist extension    Wrist ulnar deviation    Wrist radial deviation    Wrist pronation    Wrist supination     (Blank rows = not tested)  UPPER EXTREMITY MMT:  MMT Right eval Left eval  Shoulder flexion    Shoulder extension    Shoulder abduction    Shoulder adduction    Shoulder extension    Shoulder internal rotation    Shoulder external rotation    Middle trapezius    Lower trapezius    Elbow flexion    Elbow extension    Wrist flexion    Wrist extension    Wrist ulnar deviation    Wrist radial deviation    Wrist pronation    Wrist supination    Grip strength     (Blank rows = not tested)  CERVICAL SPECIAL TESTS:  Upper limb tension test (ULTT): Negative  FUNCTIONAL TESTS:  30 seconds chair stand test n/a  TODAY'S TREATMENT:    [facility]  Adult PT Treatment:                                                 DATE: 04/13/23 Therapeutic Exercise: Prone Y 10x B Prone T 10x B Prone extension 10x B Prone W 10x B Manual Therapy: Skilled palpation to identify taught and irritable bands in R infraspinatus and teres major Trigger Point Dry Needling Treatment: Pre-treatment instruction: Patient instructed on dry needling rationale, procedures, and possible side effects including pain during treatment (achy,cramping feeling), bruising, drop of blood, lightheadedness, nausea, sweating. Patient Consent Given: Yes Education handout provided: Previously provided Muscles treated: R infraspinatus and teres major  Needle size and number: .25x37mm x 2 Electrical stimulation performed: No Parameters: N/A Treatment response/outcome: Twitch response elicited and Palpable decrease in muscle tension Post-treatment instructions: Patient instructed to expect possible mild to moderate muscle soreness later today and/or tomorrow. Patient instructed in methods to reduce muscle soreness and to continue prescribed HEP. If patient was dry needled over the lung field, patient was instructed on signs and symptoms of pneumothorax and, however unlikely, to see immediate medical attention should they occur. Patient was also educated on signs and symptoms of infection and to seek medical attention should they occur. Patient verbalized understanding of these instructions and education.     [facility] Adult PT Treatment:                                                DATE: 04/06/23 Therapeutic Exercise: Doorway stretch 30s x 3 Manual Therapy: Skilled palpation to identify taught and irritable bands in B pec major/minor  Trigger Point Dry Needling Treatment: Pre-treatment instruction: Patient instructed on dry needling rationale, procedures, and possible side effects including pain during treatment (achy,cramping feeling), bruising, drop of blood, lightheadedness, nausea, sweating. Patient Consent Given: Yes Education handout  provided: Previously provided Muscles treated: B pec major/minor, direct approach Needle size and number: .25x25mm x 2, .30x36mm x 2 Electrical stimulation performed: No Parameters: N/A Treatment response/outcome: Twitch response elicited and Palpable decrease in muscle tension Post-treatment instructions: Patient instructed to expect possible mild to moderate muscle soreness later today and/or tomorrow. Patient instructed in methods to reduce muscle soreness and to continue prescribed HEP. If patient was dry needled over the lung field, patient was instructed on signs and symptoms of pneumothorax and, however unlikely, to see immediate medical attention should they occur. Patient was also educated on signs and symptoms of infection and to seek medical attention should they occur. Patient verbalized understanding of these instructions and education.        [facility] Adult PT Treatment:                                                DATE: 04/02/23 Therapeutic Exercise: Eluterio dog 10/10 Cat/cow 10x Open book 10/10 Manual Therapy: Skilled palpation to identify taught and irritable bands in R iliocostalis b/t T12-8  Trigger Point Dry Needling Treatment: Pre-treatment instruction: Patient instructed on dry needling rationale, procedures, and possible side effects including pain during treatment (achy,cramping feeling), bruising, drop of blood, lightheadedness, nausea, sweating. Patient Consent  Given: Yes Education handout provided: Yes Muscles treated: R iliocostalis b/t T 12-8  Needle size and number:  .30 x 30mm 3 Electrical stimulation performed: No Parameters: N/A Treatment response/outcome: Twitch response elicited and Palpable decrease in muscle tension Post-treatment instructions: Patient instructed to expect possible mild to moderate muscle soreness later today and/or tomorrow. Patient instructed in methods to reduce muscle soreness and to continue prescribed HEP. If patient was dry needled over  the lung field, patient was instructed on signs and symptoms of pneumothorax and, however unlikely, to see immediate medical attention should they occur. Patient was also educated on signs and symptoms of infection and to seek medical attention should they occur. Patient verbalized understanding of these instructions and education.                                                                                                                 DATE: 03/21/23 Eval and  HEP  PATIENT EDUCATION:  Education details: Discussed eval findings, rehab rationale and POC and patient is in agreement  Person educated: Patient Education method: Explanation Education comprehension: verbalized understanding and needs further education  HOME EXERCISE PROGRAM: Access Code: [email or web address] URL: [email or web address] Date: 04/13/2023 Prepared by: Reyes Kohut  Exercises - Doorway Pec Stretch at 90 Degrees Abduction  - 2 x daily - 5 x weekly - 1 sets - 3 reps - 30s hold - Prone W Scapular Retraction  - 1 x daily - 5 x weekly - 2 sets - 10 reps - Prone Scapular Slide with Shoulder Extension  - 1 x daily - 5 x weekly - 2 sets - 10 reps - Prone Scapular Retraction Y  - 1 x daily - 5 x weekly - 2 sets - 10 reps - Prone Shoulder Horizontal Abduction with Thumbs Up  - 1 x daily - 5 x weekly - 2 sets - 10 reps  ASSESSMENT:  CLINICAL IMPRESSION: Last session treatment of pec minor beneficial.  FOTO score improved.  Symptoms localized to posterior R shoulder and dry needling performed as noted above.  Advance HEP to more difficult and challenging exercises to promote postural correction and address muscular imbalance.  Handout provided on proper technique to promote workplace ergonomics.  Recommend continue 2 additional weeks to address remaining symptoms.  Patient is a 58 y.o. female who was seen today for physical therapy evaluation and treatment for posterior R shoulder pain. Patient presents with full  cervical and BUE AROM.  Postural changes include rounded shoulders and kyphotic posture.  Palpation finds tenderness in thoracic paraspinals and teres groups presenting as myofascial trigger points.  Patient is s good candidate for OPPT to include TPDN.  OBJECTIVE IMPAIRMENTS: decreased activity tolerance, decreased knowledge of condition, increased fascial restrictions, increased muscle spasms, impaired UE functional use, postural dysfunction, and pain.   ACTIVITY LIMITATIONS: carrying, lifting, sitting, sleeping, and computer work  PERSONAL FACTORS: Age, Education, and Fitness are also affecting patient's functional outcome.   REHAB POTENTIAL: Good  CLINICAL DECISION MAKING: Stable/uncomplicated  EVALUATION COMPLEXITY: Low  GOALS: Goals reviewed with patient? No  SHORT TERM GOALS=LONG TERM GOALS: Target date: 05/02/2023    Patient to demonstrate independence in HEP  Baseline: [email or web address] Goal status: MEt  2.  Patient will acknowledge 4/10 pain at least once during episode of care   Baseline: 9/10; 04/02/23 4/10 pain Goal status: Met  3.  Patient will score at least 77% on FOTO to signify clinically meaningful improvement in functional abilities.   Baseline: 36 Goal status: INITIAL  4.  Decrease TTP in affected regions to minimal Baseline: Moderate to severe Goal status: Ongoing     PLAN:  PT FREQUENCY: 1-2x/week  PT DURATION: 6 weeks  PLANNED INTERVENTIONS: 97164- PT Re-evaluation, 97110-Therapeutic exercises, 97530- Therapeutic activity, 97112- Neuromuscular re-education, 97535- Self Care, [id number]- Manual therapy, 97032- Electrical stimulation (manual), Dry Needling, Joint mobilization, Spinal mobilization, Cryotherapy, and Moist heat  PLAN FOR NEXT SESSION: HEP review and update, manual techniques as appropriate, aerobic tasks, ROM and flexibility activities, strengthening and PREs, TPDN, gait and balance training as needed     Reyes CHRISTELLA Kohut, PT 04/13/2023, 9:17  AM

## 2023-04-13 ENCOUNTER — Ambulatory Visit: Payer: Managed Care, Other (non HMO) | Attending: Family Medicine

## 2023-04-13 DIAGNOSIS — R293 Abnormal posture: Secondary | ICD-10-CM | POA: Diagnosis present

## 2023-04-13 DIAGNOSIS — M6281 Muscle weakness (generalized): Secondary | ICD-10-CM | POA: Diagnosis present

## 2023-04-13 DIAGNOSIS — M546 Pain in thoracic spine: Secondary | ICD-10-CM | POA: Diagnosis present

## 2023-04-13 NOTE — Therapy (Deleted)
 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   Past Surgical History:  Procedure Laterality Date   DIAGNOSTIC LAPAROSCOPY     MASTECTOMY W/ SENTINEL NODE BIOPSY Bilateral 09/16/2018   Procedure: RIGHT MASTECTOMY WITH RIGHT AXILLARY SENTINEL LYMPH NODE BIOPSY AND LEFT RISK REDUCING MASTECTOMY;  Surgeon: Ebbie Cough, MD;  Location: [facility];  Service: General;  Laterality: Bilateral;   Patient Active Problem List   Diagnosis Date Noted   Family history of cardiovascular disorder 09/12/2022   Breast cancer, right (HCC) 09/16/2018   Genetic testing 09/09/2018   Malignant neoplasm of upper-inner quadrant of right breast in female, estrogen receptor positive (HCC) 08/19/2018   Family history of breast cancer    History of penicillin allergy 09/24/2012   Exposure to strep throat 09/24/2012   Perioral dermatitis 09/24/2012   Left otitis media with effusion 08/16/2011   Hx of antibiotic allergy 05/03/2011   Disorder of teeth and supporting structures 03/30/2008   Allergic rhinitis 02/17/2008   Endometriosis 02/17/2008   Headache 02/17/2008   Hx gestational diabetes 02/17/2008   POSITIVE PPD 12/17/2007    PCP: Charlett Apolinar POUR, MD   REFERRING PROVIDER: Micheal Wolm ORN, MD  REFERRING DIAG: M54.9 (ICD-10-CM) - Upper back pain on right side  THERAPY DIAG:  No diagnosis found.  Rationale for Evaluation and Treatment: Rehabilitation  ONSET DATE: 2 weeks  SUBJECTIVE:                                                                                                                                                                                                          SUBJECTIVE STATEMENT: Reports symptom pattern changing.  Spent yesterday at her desk performing computer work and feels she tightened up Hand dominance: Right  PERTINENT HISTORY:  Seen today with onset about a week and a half ago of right periscapular pain.  This did follow lifting some heavy objects but she does not recall specific injury.  She then had hysteroscopy  last week and thinks this may have exacerbated this pain possibly due to position she was in with her procedure.  Her pain is right periscapular region.  No neck pain.  No shoulder pain.  Denies any cough, fever, dyspnea, or pleuritic pain.   She tried yoga exercises, ibuprofen, heat, and topical sports cream type ointment without much improvement.  Pain has been worsening.  Does seem to be worse with change of position and movement.   She has past medical history significant for breast cancer and is on chronic tamoxifen .  PAIN:  Are you having pain? Yes: NPRS scale: 9/10 Pain location: R posterior shoulder  Pain description: ache Aggravating factors: prolonged positions, driving, desk work Relieving factors: heat, rest  PRECAUTIONS: None  RED FLAGS: None     WEIGHT BEARING RESTRICTIONS: No  FALLS:  Has patient fallen in last 6 months? No  OCCUPATION: office work  PLOF: Independent  PATIENT GOALS: To manage my pain  NEXT MD VISIT: TBD  OBJECTIVE:  Note: Objective measures were completed at Evaluation unless otherwise noted.  DIAGNOSTIC FINDINGS:  IMPRESSION: 1. No acute cardiopulmonary disease. Specifically, no evidence of pneumonia. 2. Mild pectus deformity as demonstrated on cardiac CT performed 11/17/2022.     Electronically Signed   By: Norleen Roulette M.D.   On: 03/18/2023 16:34  PATIENT SURVEYS:  FOTO 37(66  predicted)  04/13/23 52%  POSTURE: rounded shoulders, forward head, and increased thoracic kyphosis  PALPATION: TTP R thoracic paraspinals, teres major/minor    CERVICAL ROM: WNL throughout  Active ROM A/PROM (deg) eval  Flexion   Extension   Right lateral flexion   Left lateral flexion   Right rotation   Left rotation    (Blank rows = not tested)  UPPER EXTREMITY ROM: WNL throughout  Active ROM Right eval Left eval  Shoulder flexion    Shoulder extension    Shoulder abduction    Shoulder adduction    Shoulder extension    Shoulder internal rotation    Shoulder external rotation    Elbow flexion    Elbow extension    Wrist flexion    Wrist extension    Wrist ulnar deviation    Wrist radial deviation    Wrist pronation    Wrist supination     (Blank rows = not tested)  UPPER EXTREMITY MMT:  MMT Right eval Left eval  Shoulder flexion    Shoulder extension    Shoulder abduction    Shoulder adduction    Shoulder extension    Shoulder internal rotation    Shoulder external rotation    Middle trapezius    Lower trapezius    Elbow flexion    Elbow extension    Wrist flexion    Wrist extension    Wrist ulnar deviation    Wrist radial deviation    Wrist pronation    Wrist supination    Grip strength     (Blank rows = not tested)  CERVICAL SPECIAL TESTS:  Upper limb tension test (ULTT): Negative  FUNCTIONAL TESTS:  30 seconds chair stand test n/a  TODAY'S TREATMENT:    [facility] Adult PT Treatment:                                                DATE: 04/13/23 Therapeutic Exercise: Prone Y 10x B Prone T 10x B  Prone extension 10x B Prone W 10x B Manual Therapy: Skilled palpation to identify taught and irritable bands in R infraspinatus and teres major Trigger Point Dry Needling Treatment: Pre-treatment instruction: Patient instructed on dry needling rationale, procedures, and possible side effects including pain during treatment (achy,cramping feeling),  bruising, drop of blood, lightheadedness, nausea, sweating. Patient Consent Given: Yes Education handout provided: Previously provided Muscles treated: R infraspinatus and teres major  Needle size and number: .25x53mm x 2 Electrical stimulation performed: No Parameters: N/A Treatment response/outcome: Twitch response elicited and Palpable decrease in muscle tension Post-treatment instructions: Patient instructed to expect possible mild to moderate muscle soreness later today and/or tomorrow. Patient instructed in methods to reduce muscle soreness and to continue prescribed HEP. If patient was dry needled over the lung field, patient was instructed on signs and symptoms of pneumothorax and, however unlikely, to see immediate medical attention should they occur. Patient was also educated on signs and symptoms of infection and to seek medical attention should they occur. Patient verbalized understanding of these instructions and education.     [facility] Adult PT Treatment:                                                DATE: 04/06/23 Therapeutic Exercise: Doorway stretch 30s x 3 Manual Therapy: Skilled palpation to identify taught and irritable bands in B pec major/minor  Trigger Point Dry Needling Treatment: Pre-treatment instruction: Patient instructed on dry needling rationale, procedures, and possible side effects including pain during treatment (achy,cramping feeling), bruising, drop of blood, lightheadedness, nausea, sweating. Patient Consent Given: Yes Education handout provided: Previously provided Muscles treated: B pec major/minor, direct approach Needle size and number: .25x59mm x 2, .30x67mm x 2 Electrical stimulation performed: No Parameters: N/A Treatment response/outcome: Twitch response elicited and Palpable decrease in muscle tension Post-treatment instructions: Patient instructed to expect possible mild to moderate muscle soreness later today and/or tomorrow. Patient instructed in  methods to reduce muscle soreness and to continue prescribed HEP. If patient was dry needled over the lung field, patient was instructed on signs and symptoms of pneumothorax and, however unlikely, to see immediate medical attention should they occur. Patient was also educated on signs and symptoms of infection and to seek medical attention should they occur. Patient verbalized understanding of these instructions and education.        [facility] Adult PT Treatment:                                                DATE: 04/02/23 Therapeutic Exercise: Eluterio dog 10/10 Cat/cow 10x Open book 10/10 Manual Therapy: Skilled palpation to identify taught and irritable bands in R iliocostalis b/t T12-8  Trigger Point Dry Needling Treatment: Pre-treatment instruction: Patient instructed on dry needling rationale, procedures, and possible side effects including pain during treatment (achy,cramping feeling), bruising, drop of blood, lightheadedness, nausea, sweating. Patient Consent Given: Yes Education handout provided: Yes Muscles treated: R iliocostalis b/t T 12-8  Needle size and number:  .30 x 30mm 3 Electrical stimulation performed: No Parameters: N/A Treatment response/outcome: Twitch response elicited and Palpable decrease in muscle tension Post-treatment instructions: Patient instructed to expect possible mild to moderate muscle soreness later today and/or tomorrow. Patient instructed in methods to reduce  muscle soreness and to continue prescribed HEP. If patient was dry needled over the lung field, patient was instructed on signs and symptoms of pneumothorax and, however unlikely, to see immediate medical attention should they occur. Patient was also educated on signs and symptoms of infection and to seek medical attention should they occur. Patient verbalized understanding of these instructions and education.                                                                                                                  DATE: 03/21/23 Eval and  HEP  PATIENT EDUCATION:  Education details: Discussed eval findings, rehab rationale and POC and patient is in agreement  Person educated: Patient Education method: Explanation Education comprehension: verbalized understanding and needs further education  HOME EXERCISE PROGRAM: Access Code: [email or web address] URL: [email or web address] Date: 04/13/2023 Prepared by: Reyes Kohut  Exercises - Doorway Pec Stretch at 90 Degrees Abduction  - 2 x daily - 5 x weekly - 1 sets - 3 reps - 30s hold - Prone W Scapular Retraction  - 1 x daily - 5 x weekly - 2 sets - 10 reps - Prone Scapular Slide with Shoulder Extension  - 1 x daily - 5 x weekly - 2 sets - 10 reps - Prone Scapular Retraction Y  - 1 x daily - 5 x weekly - 2 sets - 10 reps - Prone Shoulder Horizontal Abduction with Thumbs Up  - 1 x daily - 5 x weekly - 2 sets - 10 reps  ASSESSMENT:  CLINICAL IMPRESSION: Last session treatment of pec minor beneficial.  FOTO score improved.  Symptoms localized to posterior R shoulder and dry needling performed as noted above.  Advance HEP to more difficult and challenging exercises to promote postural correction and address muscular imbalance.  Handout provided on proper technique to promote workplace ergonomics.  Recommend continue 2 additional weeks to address remaining symptoms.  Patient is a 58 y.o. female who was seen today for physical therapy evaluation and treatment for posterior R shoulder pain. Patient presents with full cervical and BUE AROM.  Postural changes include rounded shoulders and kyphotic posture.  Palpation finds tenderness in thoracic paraspinals and teres groups presenting as myofascial trigger points.  Patient is s good candidate for OPPT to include TPDN.  OBJECTIVE IMPAIRMENTS: decreased activity tolerance, decreased knowledge of condition, increased fascial restrictions, increased muscle spasms, impaired UE functional use, postural  dysfunction, and pain.   ACTIVITY LIMITATIONS: carrying, lifting, sitting, sleeping, and computer work  PERSONAL FACTORS: Age, Education, and Fitness are also affecting patient's functional outcome.   REHAB POTENTIAL: Good  CLINICAL DECISION MAKING: Stable/uncomplicated  EVALUATION COMPLEXITY: Low   GOALS: Goals reviewed with patient? No  SHORT TERM GOALS=LONG TERM GOALS: Target date: 05/02/2023    Patient to demonstrate independence in HEP  Baseline: [email or web address] Goal status: MEt  2.  Patient will acknowledge 4/10 pain at least once during episode of care   Baseline: 9/10; 04/02/23 4/10 pain Goal status: Met  3.  Patient will score  at least 77% on FOTO to signify clinically meaningful improvement in functional abilities.   Baseline: 36 Goal status: INITIAL  4.  Decrease TTP in affected regions to minimal Baseline: Moderate to severe Goal status: Ongoing     PLAN:  PT FREQUENCY: 1-2x/week  PT DURATION: 6 weeks  PLANNED INTERVENTIONS: 97164- PT Re-evaluation, 97110-Therapeutic exercises, 97530- Therapeutic activity, 97112- Neuromuscular re-education, 97535- Self Care, [id number]- Manual therapy, 97032- Electrical stimulation (manual), Dry Needling, Joint mobilization, Spinal mobilization, Cryotherapy, and Moist heat  PLAN FOR NEXT SESSION: HEP review and update, manual techniques as appropriate, aerobic tasks, ROM and flexibility activities, strengthening and PREs, TPDN, gait and balance training as needed     Reyes CHRISTELLA Kohut, PT 04/13/2023, 12:02 PM

## 2023-04-16 ENCOUNTER — Telehealth: Payer: Self-pay

## 2023-04-16 ENCOUNTER — Ambulatory Visit: Payer: Managed Care, Other (non HMO)

## 2023-04-16 NOTE — Therapy (Signed)
 [facility] TREATMENT NOTE   Patient Name: Megan Armstrong MRN: [id number] DOB:[birth year], 58 y.o., female Today's Date: 04/18/2023  END OF SESSION:  PT End of Session - 04/18/23 1000     Visit Number 8    Number of Visits 12    Date for PT Re-Evaluation 05/22/23    Authorization Type cigna    PT Start Time 1000    PT Stop Time 1045    PT Time Calculation (min) 45 min    Activity Tolerance Patient tolerated treatment well    Behavior During Therapy WFL for tasks assessed/performed                   Past Medical History:  Diagnosis Date   Allergic rhinitis    Endometrial hyperplasia    Endometriosis    Family history of breast cancer    Family history of breast cancer    Hx of endometriosis    rectal vag with laser surgery    Seasonal allergies    TB skin/subcutaneous    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   Past Surgical History:  Procedure Laterality Date   DIAGNOSTIC LAPAROSCOPY     MASTECTOMY W/ SENTINEL NODE BIOPSY Bilateral 09/16/2018   Procedure: RIGHT MASTECTOMY WITH RIGHT AXILLARY SENTINEL LYMPH NODE BIOPSY AND LEFT RISK REDUCING MASTECTOMY;  Surgeon: Ebbie Cough, MD;  Location: [facility];  Service: General;  Laterality: Bilateral;   Patient Active Problem List   Diagnosis Date Noted   Family history of cardiovascular disorder 09/12/2022   Breast cancer, right (HCC) 09/16/2018   Genetic testing 09/09/2018   Malignant neoplasm of upper-inner quadrant of right breast in female, estrogen receptor positive (HCC) 08/19/2018   Family history of breast cancer    History of penicillin allergy 09/24/2012   Exposure to strep throat 09/24/2012   Perioral dermatitis 09/24/2012   Left otitis media with effusion 08/16/2011   Hx of antibiotic allergy 05/03/2011   Disorder of teeth and supporting structures 03/30/2008   Allergic rhinitis 02/17/2008   Endometriosis 02/17/2008   Headache  02/17/2008   Hx gestational diabetes 02/17/2008   POSITIVE PPD 12/17/2007    PCP: Charlett Apolinar POUR, MD   REFERRING PROVIDER: Micheal Wolm ORN, MD  REFERRING DIAG: M54.9 (ICD-10-CM) - Upper back pain on right side  THERAPY DIAG:  Pain in thoracic spine  Abnormal posture  Muscle weakness (generalized)  Rationale for Evaluation and Treatment: Rehabilitation  ONSET DATE: 2 weeks  SUBJECTIVE:  SUBJECTIVE STATEMENT: Reports minimal R sided symptoms but has developed similar but less intense symptoms on the L.  Sleep pattern much improved Hand dominance: Right  PERTINENT HISTORY:  Seen today with onset about a week and a half ago of right periscapular pain.  This did follow lifting some heavy objects but she does not recall specific injury.  She then had hysteroscopy last week and thinks this may have exacerbated this pain possibly due to position she was in with her procedure.  Her pain is right periscapular region.  No neck pain.  No shoulder pain.  Denies any cough, fever, dyspnea, or pleuritic pain.   She tried yoga exercises, ibuprofen, heat, and topical sports cream type ointment without much improvement.  Pain has been worsening.  Does seem to be worse with change of position and movement.   She has past medical history significant for breast cancer and is on chronic tamoxifen .  PAIN:  Are you having pain? Yes: NPRS scale: 9/10 Pain location: R posterior shoulder  Pain description: ache Aggravating factors: prolonged positions, driving, desk work Relieving factors: heat, rest  PRECAUTIONS: None  RED FLAGS: None     WEIGHT BEARING RESTRICTIONS: No  FALLS:  Has patient fallen in last 6 months? No  OCCUPATION: office work  PLOF: Independent  PATIENT GOALS: To manage  my pain  NEXT MD VISIT: TBD  OBJECTIVE:  Note: Objective measures were completed at Evaluation unless otherwise noted.  DIAGNOSTIC FINDINGS:  IMPRESSION: 1. No acute cardiopulmonary disease. Specifically, no evidence of pneumonia. 2. Mild pectus deformity as demonstrated on cardiac CT performed 11/17/2022.     Electronically Signed   By: Norleen Roulette M.D.   On: 03/18/2023 16:34  PATIENT SURVEYS:  FOTO 37(66 predicted)  04/13/23 52%  POSTURE: rounded shoulders, forward head, and increased thoracic kyphosis  PALPATION: TTP R thoracic paraspinals, teres major/minor    CERVICAL ROM: WNL throughout  Active ROM A/PROM (deg) eval AROM  Flexion    Extension  100%  Right lateral flexion  50%  Left lateral flexion  50%  Right rotation  90%  Left rotation  90% P!   (Blank rows = not tested)  UPPER EXTREMITY ROM: WNL throughout  Active ROM Right eval Left eval  Shoulder flexion    Shoulder extension    Shoulder abduction    Shoulder adduction    Shoulder extension    Shoulder internal rotation    Shoulder external rotation    Elbow flexion    Elbow extension    Wrist flexion    Wrist extension    Wrist ulnar deviation    Wrist radial deviation    Wrist pronation    Wrist supination     (Blank rows = not tested)  UPPER EXTREMITY MMT:  MMT Right eval Left eval  Shoulder flexion    Shoulder extension    Shoulder abduction    Shoulder adduction    Shoulder extension    Shoulder internal rotation    Shoulder external rotation    Middle trapezius    Lower trapezius    Elbow flexion    Elbow extension    Wrist flexion    Wrist extension    Wrist ulnar deviation    Wrist radial deviation    Wrist pronation    Wrist supination    Grip strength     (Blank rows = not tested)  CERVICAL SPECIAL TESTS:  Upper limb tension test (ULTT): Negative  FUNCTIONAL TESTS:  30 seconds  chair stand test n/a  TODAY'S TREATMENT:    [facility] Adult PT Treatment:                                                 DATE: 04/18/23 Therapeutic Exercise: Prone flexion B 10x Prone extension B 10x Prone Ws 10x B Prone Ts 10x B Supine OH flexion with breathing patterns followed to allow rib excursion 10x Manual Therapy: Skilled palpation to identify taught and irritable bands in B levator scapula and L teres major  Trigger Point Dry Needling  Patient Verbal Consent Given: Yes Education Handout Provided: Previously Provided Muscles Treated: B levator scapula and L teres major Electrical Stimulation Performed: No Treatment Response/Outcome: decreased tension and irritability to palpation in all 3 regions  [facility] Adult PT Treatment:                                                DATE: 04/13/23 Therapeutic Exercise: Prone Y 10x B Prone T 10x B Prone extension 10x B Prone W 10x B L levator stertch 30s x2 Supine OH flexion with breathing patterns 10x Manual Therapy: Skilled palpation to identify taught and irritable bands in R infraspinatus and teres major Trigger Point Dry Needling Treatment: Pre-treatment instruction: Patient instructed on dry needling rationale, procedures, and possible side effects including pain during treatment (achy,cramping feeling), bruising, drop of blood, lightheadedness, nausea, sweating. Patient Consent Given: Yes Education handout provided: Previously provided Muscles treated: R infraspinatus and teres major  Needle size and number: .25x43mm x 2 Electrical stimulation performed: No Parameters: N/A Treatment response/outcome: Twitch response elicited and Palpable decrease in muscle tension Post-treatment instructions: Patient instructed to expect possible mild to moderate muscle soreness later today and/or tomorrow. Patient instructed in methods to reduce muscle soreness and to continue prescribed HEP. If patient was dry needled over the lung field, patient was instructed on signs and symptoms of pneumothorax and, however unlikely, to see  immediate medical attention should they occur. Patient was also educated on signs and symptoms of infection and to seek medical attention should they occur. Patient verbalized understanding of these instructions and education.     [facility] Adult PT Treatment:                                                DATE: 04/06/23 Therapeutic Exercise: Doorway stretch 30s x 3 Manual Therapy: Skilled palpation to identify taught and irritable bands in B pec major/minor  Trigger Point Dry Needling Treatment: Pre-treatment instruction: Patient instructed on dry needling rationale, procedures, and possible side effects including pain during treatment (achy,cramping feeling), bruising, drop of blood, lightheadedness, nausea, sweating. Patient Consent Given: Yes Education handout provided: Previously provided Muscles treated: B pec major/minor, direct approach Needle size and number: .25x59mm x 2, .30x44mm x 2 Electrical stimulation performed: No Parameters: N/A Treatment response/outcome: Twitch response elicited and Palpable decrease in muscle tension Post-treatment instructions: Patient instructed to expect possible mild to moderate muscle soreness later today and/or tomorrow. Patient instructed in methods to reduce muscle soreness and to continue prescribed HEP. If patient was dry needled over the lung  field, patient was instructed on signs and symptoms of pneumothorax and, however unlikely, to see immediate medical attention should they occur. Patient was also educated on signs and symptoms of infection and to seek medical attention should they occur. Patient verbalized understanding of these instructions and education.        [facility] Adult PT Treatment:                                                DATE: 04/02/23 Therapeutic Exercise: Eluterio dog 10/10 Cat/cow 10x Open book 10/10 Manual Therapy: Skilled palpation to identify taught and irritable bands in R iliocostalis b/t T12-8  Trigger Point Dry Needling  Treatment: Pre-treatment instruction: Patient instructed on dry needling rationale, procedures, and possible side effects including pain during treatment (achy,cramping feeling), bruising, drop of blood, lightheadedness, nausea, sweating. Patient Consent Given: Yes Education handout provided: Yes Muscles treated: R iliocostalis b/t T 12-8  Needle size and number:  .30 x 30mm 3 Electrical stimulation performed: No Parameters: N/A Treatment response/outcome: Twitch response elicited and Palpable decrease in muscle tension Post-treatment instructions: Patient instructed to expect possible mild to moderate muscle soreness later today and/or tomorrow. Patient instructed in methods to reduce muscle soreness and to continue prescribed HEP. If patient was dry needled over the lung field, patient was instructed on signs and symptoms of pneumothorax and, however unlikely, to see immediate medical attention should they occur. Patient was also educated on signs and symptoms of infection and to seek medical attention should they occur. Patient verbalized understanding of these instructions and education.                                                                                                                 DATE: 03/21/23 Eval and  HEP  PATIENT EDUCATION:  Education details: Discussed eval findings, rehab rationale and POC and patient is in agreement  Person educated: Patient Education method: Explanation Education comprehension: verbalized understanding and needs further education  HOME EXERCISE PROGRAM: Access Code: [email or web address] URL: [email or web address] Date: 04/13/2023 Prepared by: Reyes Kohut  Exercises - Doorway Pec Stretch at 90 Degrees Abduction  - 2 x daily - 5 x weekly - 1 sets - 3 reps - 30s hold - Prone W Scapular Retraction  - 1 x daily - 5 x weekly - 2 sets - 10 reps - Prone Scapular Slide with Shoulder Extension  - 1 x daily - 5 x weekly - 2 sets - 10 reps -  Prone Scapular Retraction Y  - 1 x daily - 5 x weekly - 2 sets - 10 reps - Prone Shoulder Horizontal Abduction with Thumbs Up  - 1 x daily - 5 x weekly - 2 sets - 10 reps  ASSESSMENT:  CLINICAL IMPRESSION: Addressed L sided symptoms as R side mainly asymptomatic.  Cervical mobility assessed prior to TPDN with restrictions noted in B SB mainly, B  rotation secondarily which markedly increased following TPDN.  Symptoms reported as much less following session.  Patient is a 59 y.o. female who was seen today for physical therapy evaluation and treatment for posterior R shoulder pain. Patient presents with full cervical and BUE AROM.  Postural changes include rounded shoulders and kyphotic posture.  Palpation finds tenderness in thoracic paraspinals and teres groups presenting as myofascial trigger points.  Patient is s good candidate for OPPT to include TPDN.  OBJECTIVE IMPAIRMENTS: decreased activity tolerance, decreased knowledge of condition, increased fascial restrictions, increased muscle spasms, impaired UE functional use, postural dysfunction, and pain.   ACTIVITY LIMITATIONS: carrying, lifting, sitting, sleeping, and computer work  PERSONAL FACTORS: Age, Education, and Fitness are also affecting patient's functional outcome.   REHAB POTENTIAL: Good  CLINICAL DECISION MAKING: Stable/uncomplicated  EVALUATION COMPLEXITY: Low   GOALS: Goals reviewed with patient? No  SHORT TERM GOALS=LONG TERM GOALS: Target date: 05/02/2023    Patient to demonstrate independence in HEP  Baseline: [email or web address] Goal status: MEt  2.  Patient will acknowledge 4/10 pain at least once during episode of care   Baseline: 9/10; 04/02/23 4/10 pain Goal status: Met  3.  Patient will score at least 77% on FOTO to signify clinically meaningful improvement in functional abilities.   Baseline: 36 Goal status: INITIAL  4.  Decrease TTP in affected regions to minimal Baseline: Moderate to severe Goal status:  Ongoing     PLAN:  PT FREQUENCY: 1-2x/week  PT DURATION: 6 weeks  PLANNED INTERVENTIONS: 97164- PT Re-evaluation, 97110-Therapeutic exercises, 97530- Therapeutic activity, 97112- Neuromuscular re-education, 97535- Self Care, [id number]- Manual therapy, 97032- Electrical stimulation (manual), Dry Needling, Joint mobilization, Spinal mobilization, Cryotherapy, and Moist heat  PLAN FOR NEXT SESSION: HEP review and update, manual techniques as appropriate, aerobic tasks, ROM and flexibility activities, strengthening and PREs, TPDN, gait and balance training as needed     Reyes CHRISTELLA Kohut, PT 04/18/2023, 11:32 AM

## 2023-04-16 NOTE — Telephone Encounter (Signed)
 TV due to missed visit.  Spoke directly to patient who related she did not think she had a visit today.  Reminded of next scheduled visit and will reschedule today's session

## 2023-04-18 ENCOUNTER — Ambulatory Visit: Payer: Managed Care, Other (non HMO)

## 2023-04-18 DIAGNOSIS — M546 Pain in thoracic spine: Secondary | ICD-10-CM

## 2023-04-18 DIAGNOSIS — R293 Abnormal posture: Secondary | ICD-10-CM

## 2023-04-18 DIAGNOSIS — M6281 Muscle weakness (generalized): Secondary | ICD-10-CM

## 2023-04-23 NOTE — Therapy (Signed)
 [facility] OUTPATIENT PHYSICAL THERAPY TREATMENT NOTE   Patient Name: Megan Armstrong MRN: [id number] DOB:[birth year], 58 y.o., female Today's Date: 04/25/2023  END OF SESSION:  PT End of Session - 04/25/23 0925     Visit Number 9    Number of Visits 12    Date for PT Re-Evaluation 05/22/23    Authorization Type cigna    PT Start Time [phone]   late for appointment   PT Stop Time 0955    PT Time Calculation (min) 30 min    Activity Tolerance Patient tolerated treatment well    Behavior During Therapy [facility] for tasks assessed/performed                    Past Medical History:  Diagnosis Date   Allergic rhinitis    Endometrial hyperplasia    Endometriosis    Family history of breast cancer    Family history of breast cancer    Hx of endometriosis    rectal vag with laser surgery    Seasonal allergies    TB skin/subcutaneous    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   Past Surgical History:  Procedure Laterality Date   DIAGNOSTIC LAPAROSCOPY     MASTECTOMY W/ SENTINEL NODE BIOPSY Bilateral 09/16/2018   Procedure: RIGHT MASTECTOMY WITH RIGHT AXILLARY SENTINEL LYMPH NODE BIOPSY AND LEFT RISK REDUCING MASTECTOMY;  Surgeon: Enid Harry, MD;  Location: [facility];  Service: General;  Laterality: Bilateral;   Patient Active Problem List   Diagnosis Date Noted   Family history of cardiovascular disorder 09/12/2022   Breast cancer, right (HCC) 09/16/2018   Genetic testing 09/09/2018   Malignant neoplasm of upper-inner quadrant of right breast in female, estrogen receptor positive (HCC) 08/19/2018   Family history of breast cancer    History of penicillin allergy 09/24/2012   Exposure to strep throat 09/24/2012   Perioral dermatitis 09/24/2012   Left otitis media with effusion 08/16/2011   Hx of antibiotic allergy 05/03/2011   Disorder of teeth and supporting structures 03/30/2008   Allergic rhinitis 02/17/2008   Endometriosis  02/17/2008   Headache 02/17/2008   Hx gestational diabetes 02/17/2008   POSITIVE PPD 12/17/2007    PCP: Reginal Capra, MD   REFERRING PROVIDER: Marquetta Sit, MD  REFERRING DIAG: M54.9 (ICD-10-CM) - Upper back pain on right side  THERAPY DIAG:  Pain in thoracic spine  Abnormal posture  Muscle weakness (generalized)  Rationale for Evaluation and Treatment: Rehabilitation  ONSET DATE: 2 weeks  SUBJECTIVE:  SUBJECTIVE STATEMENT: Reports R sided symptoms resolved. L flank pain reported when   Hand dominance: Right  PERTINENT HISTORY:  Seen today with onset about a week and a half ago of right periscapular pain.  This did follow lifting some heavy objects but she does not recall specific injury.  She then had hysteroscopy last week and thinks this may have exacerbated this pain possibly due to position she was in with her procedure.  Her pain is right periscapular region.  No neck pain.  No shoulder pain.  Denies any cough, fever, dyspnea, or pleuritic pain.   She tried yoga exercises, ibuprofen, heat, and topical sports cream type ointment without much improvement.  Pain has been worsening.  Does seem to be worse with change of position and movement.   She has past medical history significant for breast cancer and is on chronic tamoxifen .  PAIN:  Are you having pain? Yes: NPRS scale: 9/10 Pain location: R posterior shoulder  Pain description: ache Aggravating factors: prolonged positions, driving, desk work Relieving factors: heat, rest  PRECAUTIONS: None  RED FLAGS: None     WEIGHT BEARING RESTRICTIONS: No  FALLS:  Has patient fallen in last 6 months? No  OCCUPATION: office work  PLOF: Independent  PATIENT GOALS: To manage my pain  NEXT MD VISIT:  TBD  OBJECTIVE:  Note: Objective measures were completed at Evaluation unless otherwise noted.  DIAGNOSTIC FINDINGS:  IMPRESSION: 1. No acute cardiopulmonary disease. Specifically, no evidence of pneumonia. 2. Mild pectus deformity as demonstrated on cardiac CT performed 11/17/2022.     Electronically Signed   By: Robbi Childs M.D.   On: 03/18/2023 16:34  PATIENT SURVEYS:  FOTO 37(66 predicted)  04/13/23 52%  POSTURE: rounded shoulders, forward head, and increased thoracic kyphosis  PALPATION: TTP R thoracic paraspinals, teres major/minor    CERVICAL ROM: WNL throughout  Active ROM A/PROM (deg) eval AROM  Flexion    Extension  100%  Right lateral flexion  50%  Left lateral flexion  50%  Right rotation  90%  Left rotation  90% P!   (Blank rows = not tested)  UPPER EXTREMITY ROM: WNL throughout  Active ROM Right eval Left eval  Shoulder flexion    Shoulder extension    Shoulder abduction    Shoulder adduction    Shoulder extension    Shoulder internal rotation    Shoulder external rotation    Elbow flexion    Elbow extension    Wrist flexion    Wrist extension    Wrist ulnar deviation    Wrist radial deviation    Wrist pronation    Wrist supination     (Blank rows = not tested)  UPPER EXTREMITY MMT:  MMT Right eval Left eval  Shoulder flexion    Shoulder extension    Shoulder abduction    Shoulder adduction    Shoulder extension    Shoulder internal rotation    Shoulder external rotation    Middle trapezius    Lower trapezius    Elbow flexion    Elbow extension    Wrist flexion    Wrist extension    Wrist ulnar deviation    Wrist radial deviation    Wrist pronation    Wrist supination    Grip strength     (Blank rows = not tested)  CERVICAL SPECIAL TESTS:  Upper limb tension test (ULTT): Negative  FUNCTIONAL TESTS:  30 seconds chair stand test n/a  TODAY'S TREATMENT:  [facility] Adult PT Treatment:                                                 DATE: 04/25/23  Manual Therapy: Skilled palpation to identify taught and irritable bands in L Teres Minor, L infraspinatus Trigger Point Dry Needling  Subsequent Treatment: Instructions provided previously at initial dry needling treatment.   Patient Verbal Consent Given: Yes Education Handout Provided: Previously Provided Muscles Treated: L Teres Minor, L infraspinatus Electrical Stimulation Performed: No Treatment Response/Outcome: decreased muscle tension and      Self Care: Review of posture while driving as well as open book in standing  [facility] Adult PT Treatment:                                                DATE: 04/18/23 Therapeutic Exercise: Prone flexion B 10x Prone extension B 10x Prone Ws 10x B Prone Ts 10x B Supine OH flexion with breathing patterns followed to allow rib excursion 10x Manual Therapy: Skilled palpation to identify taught and irritable bands in B levator scapula and L teres major  Trigger Point Dry Needling  Patient Verbal Consent Given: Yes Education Handout Provided: Previously Provided Muscles Treated: B levator scapula and L teres major Electrical Stimulation Performed: No Treatment Response/Outcome: decreased tension and irritability to palpation in all 3 regions  [facility] Adult PT Treatment:                                                DATE: 04/13/23 Therapeutic Exercise: Prone Y 10x B Prone T 10x B Prone extension 10x B Prone W 10x B L levator stertch 30s x2 Supine OH flexion with breathing patterns 10x Manual Therapy: Skilled palpation to identify taught and irritable bands in R infraspinatus and teres major Trigger Point Dry Needling Treatment: Pre-treatment instruction: Patient instructed on dry needling rationale, procedures, and possible side effects including pain during treatment (achy,cramping feeling), bruising, drop of blood, lightheadedness, nausea, sweating. Patient Consent Given: Yes Education handout provided:  Previously provided Muscles treated: R infraspinatus and teres major  Needle size and number: .25x6mm x 2 Electrical stimulation performed: No Parameters: N/A Treatment response/outcome: Twitch response elicited and Palpable decrease in muscle tension Post-treatment instructions: Patient instructed to expect possible mild to moderate muscle soreness later today and/or tomorrow. Patient instructed in methods to reduce muscle soreness and to continue prescribed HEP. If patient was dry needled over the lung field, patient was instructed on signs and symptoms of pneumothorax and, however unlikely, to see immediate medical attention should they occur. Patient was also educated on signs and symptoms of infection and to seek medical attention should they occur. Patient verbalized understanding of these instructions and education.     [facility] Adult PT Treatment:                                                DATE: 04/06/23 Therapeutic Exercise: Doorway stretch 30s x 3 Manual Therapy:  Skilled palpation to identify taught and irritable bands in B pec major/minor  Trigger Point Dry Needling Treatment: Pre-treatment instruction: Patient instructed on dry needling rationale, procedures, and possible side effects including pain during treatment (achy,cramping feeling), bruising, drop of blood, lightheadedness, nausea, sweating. Patient Consent Given: Yes Education handout provided: Previously provided Muscles treated: B pec major/minor, direct approach Needle size and number: .25x72mm x 2, .30x52mm x 2 Electrical stimulation performed: No Parameters: N/A Treatment response/outcome: Twitch response elicited and Palpable decrease in muscle tension Post-treatment instructions: Patient instructed to expect possible mild to moderate muscle soreness later today and/or tomorrow. Patient instructed in methods to reduce muscle soreness and to continue prescribed HEP. If patient was dry needled over the lung field,  patient was instructed on signs and symptoms of pneumothorax and, however unlikely, to see immediate medical attention should they occur. Patient was also educated on signs and symptoms of infection and to seek medical attention should they occur. Patient verbalized understanding of these instructions and education.        [facility] Adult PT Treatment:                                                DATE: 04/02/23 Therapeutic Exercise: Adele Admire dog 10/10 Cat/cow 10x Open book 10/10 Manual Therapy: Skilled palpation to identify taught and irritable bands in R iliocostalis b/t T12-8  Trigger Point Dry Needling Treatment: Pre-treatment instruction: Patient instructed on dry needling rationale, procedures, and possible side effects including pain during treatment (achy,cramping feeling), bruising, drop of blood, lightheadedness, nausea, sweating. Patient Consent Given: Yes Education handout provided: Yes Muscles treated: R iliocostalis b/t T 12-8  Needle size and number:  .30 x 30mm 3 Electrical stimulation performed: No Parameters: N/A Treatment response/outcome: Twitch response elicited and Palpable decrease in muscle tension Post-treatment instructions: Patient instructed to expect possible mild to moderate muscle soreness later today and/or tomorrow. Patient instructed in methods to reduce muscle soreness and to continue prescribed HEP. If patient was dry needled over the lung field, patient was instructed on signs and symptoms of pneumothorax and, however unlikely, to see immediate medical attention should they occur. Patient was also educated on signs and symptoms of infection and to seek medical attention should they occur. Patient verbalized understanding of these instructions and education.                                                                                                                 DATE: 03/21/23 Eval and  HEP  PATIENT EDUCATION:  Education details: Discussed eval findings,  rehab rationale and POC and patient is in agreement  Person educated: Patient Education method: Explanation Education comprehension: verbalized understanding and needs further education  HOME EXERCISE PROGRAM: Access Code: [email or web address] URL: [email or web address] Date: 04/13/2023 Prepared by: Gretta Leavens  Exercises - Doorway Pec Stretch at 90 Degrees Abduction  - 2 x daily - 5 x  weekly - 1 sets - 3 reps - 30s hold - Prone W Scapular Retraction  - 1 x daily - 5 x weekly - 2 sets - 10 reps - Prone Scapular Slide with Shoulder Extension  - 1 x daily - 5 x weekly - 2 sets - 10 reps - Prone Scapular Retraction Y  - 1 x daily - 5 x weekly - 2 sets - 10 reps - Prone Shoulder Horizontal Abduction with Thumbs Up  - 1 x daily - 5 x weekly - 2 sets - 10 reps  ASSESSMENT:  CLINICAL IMPRESSION: Symptoms continue to localize and become more focused.  Only areas of discomfort noted today was L RC musculature, addressed with TPDN.  Technique f/b assessment of mobility restrictions and demonstration of standing open book  Patient is a 58 y.o. female who was seen today for physical therapy evaluation and treatment for posterior R shoulder pain. Patient presents with full cervical and BUE AROM.  Postural changes include rounded shoulders and kyphotic posture.  Palpation finds tenderness in thoracic paraspinals and teres groups presenting as myofascial trigger points.  Patient is s good candidate for OPPT to include TPDN.  OBJECTIVE IMPAIRMENTS: decreased activity tolerance, decreased knowledge of condition, increased fascial restrictions, increased muscle spasms, impaired UE functional use, postural dysfunction, and pain.   ACTIVITY LIMITATIONS: carrying, lifting, sitting, sleeping, and computer work  PERSONAL FACTORS: Age, Education, and Fitness are also affecting patient's functional outcome.   REHAB POTENTIAL: Good  CLINICAL DECISION MAKING: Stable/uncomplicated  EVALUATION COMPLEXITY:  Low   GOALS: Goals reviewed with patient? No  SHORT TERM GOALS=LONG TERM GOALS: Target date: 05/02/2023    Patient to demonstrate independence in HEP  Baseline: [id number] Goal status: MEt  2.  Patient will acknowledge 4/10 pain at least once during episode of care   Baseline: 9/10; 04/02/23 4/10 pain Goal status: Met  3.  Patient will score at least 77% on FOTO to signify clinically meaningful improvement in functional abilities.   Baseline: 36 Goal status: INITIAL  4.  Decrease TTP in affected regions to minimal Baseline: Moderate to severe Goal status: Ongoing     PLAN:  PT FREQUENCY: 1-2x/week  PT DURATION: 6 weeks  PLANNED INTERVENTIONS: 97164- PT Re-evaluation, 97110-Therapeutic exercises, 97530- Therapeutic activity, 97112- Neuromuscular re-education, 97535- Self Care, [id number]- Manual therapy, 97032- Electrical stimulation (manual), Dry Needling, Joint mobilization, Spinal mobilization, Cryotherapy, and Moist heat  PLAN FOR NEXT SESSION: HEP review and update, manual techniques as appropriate, aerobic tasks, ROM and flexibility activities, strengthening and PREs, TPDN, gait and balance training as needed     Eldon Greenland, PT 04/25/2023, 9:58 AM

## 2023-04-25 ENCOUNTER — Ambulatory Visit: Payer: Managed Care, Other (non HMO)

## 2023-04-25 DIAGNOSIS — M546 Pain in thoracic spine: Secondary | ICD-10-CM

## 2023-04-25 DIAGNOSIS — R293 Abnormal posture: Secondary | ICD-10-CM

## 2023-04-25 DIAGNOSIS — M6281 Muscle weakness (generalized): Secondary | ICD-10-CM

## 2023-04-26 NOTE — Therapy (Signed)
[facility] OUTPATIENT PHYSICAL THERAPY TREATMENT NOTE   Patient Name: Megan Armstrong MRN: [id number] DOB:Jul 30, 1965, 58 y.o., female Today's Date: 04/27/2023  END OF SESSION:  PT End of Session - 04/27/23 0834     Visit Number 10    Number of Visits 12    Date for PT Re-Evaluation 05/22/23    Authorization Type cign[employer]    PT Start Time [phone]    PT Stop Time 0915    PT Time Calculation (min) 40 min    Activity Tolerance Patient tolerated treatment well    Behavior During Therapy WFL for tasks assessed/performed            Past Medical History:  Diagnosis Date   Allergic rhinitis    Endometrial hyperplasia    Endometriosis    Family history of breast cancer    Family history of breast cancer    Hx of endometriosis    rectal vag with laser surgery    Seasonal allergies    TB skin/subcutaneous    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   Past Surgical History:  Procedure Laterality Date   DIAGNOSTIC LAPAROSCOPY     MASTECTOMY W/ SENTINEL NODE BIOPSY Bilateral 09/16/2018   Procedure: RIGHT MASTECTOMY WITH RIGHT AXILLARY SENTINEL LYMPH NODE BIOPSY AND LEFT RISK REDUCING MASTECTOMY;  Surgeon: Emelia Loron, MD;  Location: [facility];  Service: General;  Laterality: Bilateral;   Patient Active Problem List   Diagnosis Date Noted   Family history of cardiovascular disorder 09/12/2022   Breast cancer, right (HCC) 09/16/2018   Genetic testing 09/09/2018   Malignant neoplasm of upper-inner quadrant of right breast in female, estrogen receptor positive (HCC) 08/19/2018   Family history of breast cancer    History of penicillin allergy 09/24/2012   Exposure to strep throat 09/24/2012   Perioral dermatitis 09/24/2012   Left otitis media with effusion 08/16/2011   Hx of antibiotic allergy 05/03/2011   Disorder of teeth and supporting structures 03/30/2008   Allergic rhinitis 02/17/2008   Endometriosis 02/17/2008   Headache 02/17/2008   Hx  gestational diabetes 02/17/2008   POSITIVE PPD 12/17/2007    PCP: Madelin Headings, MD   REFERRING PROVIDER: Kristian Covey, MD  REFERRING DIAG: M54.9 (ICD-10-CM) - Upper back pain on right side  THERAPY DIAG:  Pain in thoracic spine  Abnormal posture  Muscle weakness (generalized)  Rationale for Evaluation and Treatment: Rehabilitation  ONSET DATE: 2 weeks  SUBJECTIVE:  SUBJECTIVE STATEMENT: Reports R sided symptoms remain resolved. L flank pain reported when turning head to check traffic.  Now able to sleep through the night w/o symptoms awakening her.  Hand dominance: Right  PERTINENT HISTORY:  Seen today with onset about a week and a half ago of right periscapular pain.  This did follow lifting some heavy objects but she does not recall specific injury.  She then had hysteroscopy last week and thinks this may have exacerbated this pain possibly due to position she was in with her procedure.  Her pain is right periscapular region.  No neck pain.  No shoulder pain.  Denies any cough, fever, dyspnea, or pleuritic pain.   She tried yoga exercises, ibuprofen, heat, and topical sports cream type ointment without much improvement.  Pain has been worsening.  Does seem to be worse with change of position and movement.   She has past medical history significant for breast cancer and is on chronic tamoxifen.  PAIN:  Are you having pain? Yes: NPRS scale: 9/10 Pain location: R posterior shoulder  Pain description: ache Aggravating factors: prolonged positions, driving, desk work Relieving factors: heat, rest  PRECAUTIONS: None  RED FLAGS: None     WEIGHT BEARING RESTRICTIONS: No  FALLS:  Has patient fallen in last 6 months? No  OCCUPATION: office work  PLOF:  Independent  PATIENT GOALS: To manage my pain  NEXT MD VISIT: TBD  OBJECTIVE:  Note: Objective measures were completed at Evaluation unless otherwise noted.  DIAGNOSTIC FINDINGS:  IMPRESSION: 1. No acute cardiopulmonary disease. Specifically, no evidence of pneumonia. 2. Mild pectus deformity as demonstrated on cardiac CT performed 11/17/2022.     Electronically Signed   By: Simonne Come M.D.   On: 03/18/2023 16:34  PATIENT SURVEYS:  FOTO 37(66 predicted)  04/13/23 52%; 04/27/23 66%  POSTURE: rounded shoulders, forward head, and increased thoracic kyphosis  PALPATION: TTP R thoracic paraspinals, teres major/minor    CERVICAL ROM: WNL throughout  Active ROM A/PROM (deg) eval AROM  Flexion    Extension  100%  Right lateral flexion  50%  Left lateral flexion  50%  Right rotation  90%  Left rotation  90% P!   (Blank rows = not tested)  UPPER EXTREMITY ROM: WNL throughout  Active ROM Right eval Left eval  Shoulder flexion    Shoulder extension    Shoulder abduction    Shoulder adduction    Shoulder extension    Shoulder internal rotation    Shoulder external rotation    Elbow flexion    Elbow extension    Wrist flexion    Wrist extension    Wrist ulnar deviation    Wrist radial deviation    Wrist pronation    Wrist supination     (Blank rows = not tested)  UPPER EXTREMITY MMT:  MMT Right eval Left eval  Shoulder flexion    Shoulder extension    Shoulder abduction    Shoulder adduction    Shoulder extension    Shoulder internal rotation    Shoulder external rotation    Middle trapezius    Lower trapezius    Elbow flexion    Elbow extension    Wrist flexion    Wrist extension    Wrist ulnar deviation    Wrist radial deviation    Wrist pronation    Wrist supination    Grip strength     (Blank rows = not tested)  CERVICAL SPECIAL TESTS:  Upper limb  tension test (ULTT): Negative  FUNCTIONAL TESTS:  30 seconds chair stand test  n/a  TODAY'S TREATMENT:    [facility] Adult PT Treatment:                                                DATE: 04/27/23 Therapeutic Exercise: B towel stretch for IR 30s hold PROM B scalene groups 30s x3  Manual Therapy: Skilled palpation to identify taught and irritable bands in B teres Major and R infraspinatus Trigger Point Dry Needling  Subsequent Treatment: Instructions provided previously at initial dry needling treatment.   Patient Verbal Consent Given: Yes Education Handout Provided: Yes Muscles Treated: B teres Major and R infraspinatus Electrical Stimulation Performed: No Treatment Response/Outcome: less discomfort as well as decrease twitch response     [facility] Adult PT Treatment:                                                DATE: 04/25/23  Manual Therapy: Skilled palpation to identify taught and irritable bands in L Teres Minor, L infraspinatus Trigger Point Dry Needling  Subsequent Treatment: Instructions provided previously at initial dry needling treatment.   Patient Verbal Consent Given: Yes Education Handout Provided: Previously Provided Muscles Treated: L Teres Minor, L infraspinatus Electrical Stimulation Performed: No Treatment Response/Outcome: decreased muscle tension and      Self Care: Review of posture while driving as well as open book in standing  [facility] Adult PT Treatment:                                                DATE: 04/18/23 Therapeutic Exercise: Prone flexion B 10x Prone extension B 10x Prone Ws 10x B Prone Ts 10x B Supine OH flexion with breathing patterns followed to allow rib excursion 10x Manual Therapy: Skilled palpation to identify taught and irritable bands in B levator scapula and L teres major  Trigger Point Dry Needling  Patient Verbal Consent Given: Yes Education Handout Provided: Previously Provided Muscles Treated: B levator scapula and L teres major Electrical Stimulation Performed: No Treatment Response/Outcome: decreased  tension and irritability to palpation in all 3 regions  [facility] Adult PT Treatment:                                                DATE: 04/13/23 Therapeutic Exercise: Prone Y 10x B Prone T 10x B Prone extension 10x B Prone W 10x B L levator stertch 30s x2 Supine OH flexion with breathing patterns 10x Manual Therapy: Skilled palpation to identify taught and irritable bands in R infraspinatus and teres major Trigger Point Dry Needling Treatment: Pre-treatment instruction: Patient instructed on dry needling rationale, procedures, and possible side effects including pain during treatment (achy,cramping feeling), bruising, drop of blood, lightheadedness, nausea, sweating. Patient Consent Given: Yes Education handout provided: Previously provided Muscles treated: R infraspinatus and teres major  Needle size and number: .25x68mm x 2 Electrical stimulation performed: No Parameters: N/A Treatment response/outcome: Twitch  response elicited and Palpable decrease in muscle tension Post-treatment instructions: Patient instructed to expect possible mild to moderate muscle soreness later today and/or tomorrow. Patient instructed in methods to reduce muscle soreness and to continue prescribed HEP. If patient was dry needled over the lung field, patient was instructed on signs and symptoms of pneumothorax and, however unlikely, to see immediate medical attention should they occur. Patient was also educated on signs and symptoms of infection and to seek medical attention should they occur. Patient verbalized understanding of these instructions and education.     [facility] Adult PT Treatment:                                                DATE: 04/06/23 Therapeutic Exercise: Doorway stretch 30s x 3 Manual Therapy: Skilled palpation to identify taught and irritable bands in B pec major/minor  Trigger Point Dry Needling Treatment: Pre-treatment instruction: Patient instructed on dry needling rationale, procedures,  and possible side effects including pain during treatment (achy,cramping feeling), bruising, drop of blood, lightheadedness, nausea, sweating. Patient Consent Given: Yes Education handout provided: Previously provided Muscles treated: B pec major/minor, direct approach Needle size and number: .25x25mm x 2, .30x55mm x 2 Electrical stimulation performed: No Parameters: N/A Treatment response/outcome: Twitch response elicited and Palpable decrease in muscle tension Post-treatment instructions: Patient instructed to expect possible mild to moderate muscle soreness later today and/or tomorrow. Patient instructed in methods to reduce muscle soreness and to continue prescribed HEP. If patient was dry needled over the lung field, patient was instructed on signs and symptoms of pneumothorax and, however unlikely, to see immediate medical attention should they occur. Patient was also educated on signs and symptoms of infection and to seek medical attention should they occur. Patient verbalized understanding of these instructions and education.        [facility] Adult PT Treatment:                                                DATE: 04/02/23 Therapeutic Exercise: Egbert Garibaldi dog 10/10 Cat/cow 10x Open book 10/10 Manual Therapy: Skilled palpation to identify taught and irritable bands in R iliocostalis b/t T12-8  Trigger Point Dry Needling Treatment: Pre-treatment instruction: Patient instructed on dry needling rationale, procedures, and possible side effects including pain during treatment (achy,cramping feeling), bruising, drop of blood, lightheadedness, nausea, sweating. Patient Consent Given: Yes Education handout provided: Yes Muscles treated: R iliocostalis b/t T 12-8  Needle size and number:  .30 x 30mm 3 Electrical stimulation performed: No Parameters: N/A Treatment response/outcome: Twitch response elicited and Palpable decrease in muscle tension Post-treatment instructions: Patient instructed to expect  possible mild to moderate muscle soreness later today and/or tomorrow. Patient instructed in methods to reduce muscle soreness and to continue prescribed HEP. If patient was dry needled over the lung field, patient was instructed on signs and symptoms of pneumothorax and, however unlikely, to see immediate medical attention should they occur. Patient was also educated on signs and symptoms of infection and to seek medical attention should they occur. Patient verbalized understanding of these instructions and education.  DATE: 03/21/23 Eval and  HEP  PATIENT EDUCATION:  Education details: Discussed eval findings, rehab rationale and POC and patient is in agreement  Person educated: Patient Education method: Explanation Education comprehension: verbalized understanding and needs further education  HOME EXERCISE PROGRAM: Access Code: [email or web address] URL: [email or web address] Date: 04/13/2023 Prepared by: Gustavus Bryant  Exercises - Doorway Pec Stretch at 90 Degrees Abduction  - 2 x daily - 5 x weekly - 1 sets - 3 reps - 30s hold - Prone W Scapular Retraction  - 1 x daily - 5 x weekly - 2 sets - 10 reps - Prone Scapular Slide with Shoulder Extension  - 1 x daily - 5 x weekly - 2 sets - 10 reps - Prone Scapular Retraction Y  - 1 x daily - 5 x weekly - 2 sets - 10 reps - Prone Shoulder Horizontal Abduction with Thumbs Up  - 1 x daily - 5 x weekly - 2 sets - 10 reps  ASSESSMENT:  CLINICAL IMPRESSION: FOTO goal met.  Symptoms continue to decrease in both scope and intensity.  Continued to treat posterior shoulder MM.  Added scalene stretching as well as additional shoulder stertches to address limitations in IR.  B scalene stretches as well as postural education in reducing shoulder shrugs.  Patient is a 58 y.o. female who was seen today for physical therapy evaluation and  treatment for posterior R shoulder pain. Patient presents with full cervical and BUE AROM.  Postural changes include rounded shoulders and kyphotic posture.  Palpation finds tenderness in thoracic paraspinals and teres groups presenting as myofascial trigger points.  Patient is s good candidate for OPPT to include TPDN.  OBJECTIVE IMPAIRMENTS: decreased activity tolerance, decreased knowledge of condition, increased fascial restrictions, increased muscle spasms, impaired UE functional use, postural dysfunction, and pain.   ACTIVITY LIMITATIONS: carrying, lifting, sitting, sleeping, and computer work  PERSONAL FACTORS: Age, Education, and Fitness are also affecting patient's functional outcome.   REHAB POTENTIAL: Good  CLINICAL DECISION MAKING: Stable/uncomplicated  EVALUATION COMPLEXITY: Low   GOALS: Goals reviewed with patient? No  SHORT TERM GOALS=LONG TERM GOALS: Target date: 05/02/2023    Patient to demonstrate independence in HEP  Baseline: [id number] Goal status: MEt  2.  Patient will acknowledge 4/10 pain at least once during episode of care   Baseline: 9/10; 04/02/23 4/10 pain Goal status: Met  3.  Patient will score at least 77% on FOTO to signify clinically meaningful improvement in functional abilities.   Baseline: 36; 04/27/23 66% Goal status: Met  4.  Decrease TTP in affected regions to minimal Baseline: Moderate to severe Goal status: Ongoing     PLAN:  PT FREQUENCY: 1-2x/week  PT DURATION: 6 weeks  PLANNED INTERVENTIONS: [id number]- PT Re-evaluation, 97110-Therapeutic exercises, 97530- Therapeutic activity, 97112- Neuromuscular re-education, 97535- Self Care, 24401- Manual therapy, 97032- Electrical stimulation (manual), Dry Needling, Joint mobilization, Spinal mobilization, Cryotherapy, and Moist heat  PLAN FOR NEXT SESSION: HEP review and update, manual techniques as appropriate, aerobic tasks, ROM and flexibility activities, strengthening and PREs, TPDN, gait  and balance training as needed     Hildred Laser, PT 04/27/2023, 9:15 AM

## 2023-04-27 ENCOUNTER — Ambulatory Visit: Payer: Managed Care, Other (non HMO)

## 2023-04-27 DIAGNOSIS — M546 Pain in thoracic spine: Secondary | ICD-10-CM

## 2023-04-27 DIAGNOSIS — R293 Abnormal posture: Secondary | ICD-10-CM

## 2023-04-27 DIAGNOSIS — M6281 Muscle weakness (generalized): Secondary | ICD-10-CM

## 2023-05-02 ENCOUNTER — Ambulatory Visit: Payer: Managed Care, Other (non HMO)

## 2023-05-02 DIAGNOSIS — M546 Pain in thoracic spine: Secondary | ICD-10-CM

## 2023-05-02 DIAGNOSIS — M6281 Muscle weakness (generalized): Secondary | ICD-10-CM

## 2023-05-02 DIAGNOSIS — R293 Abnormal posture: Secondary | ICD-10-CM

## 2023-05-02 NOTE — Therapy (Signed)
[facility] OUTPATIENT PHYSICAL THERAPY TREATMENT NOTE   Patient Name: Megan Armstrong MRN: [id number] DOB:08/02/1965, 58 y.o., female Today's Date: 05/02/2023  END OF SESSION:  PT End of Session - 05/02/23 0929     Visit Number 11    Number of Visits 12    Date for PT Re-Evaluation 05/22/23    Authorization Type cigna    PT Start Time 0930    PT Stop Time 1000    PT Time Calculation (min) 30 min    Activity Tolerance Patient tolerated treatment well    Behavior During Therapy WFL for tasks assessed/performed             Past Medical History:  Diagnosis Date   Allergic rhinitis    Endometrial hyperplasia    Endometriosis    Family history of breast cancer    Family history of breast cancer    Hx of endometriosis    rectal vag with laser surgery    Seasonal allergies    TB skin/subcutaneous    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   Past Surgical History:  Procedure Laterality Date   DIAGNOSTIC LAPAROSCOPY     MASTECTOMY W/ SENTINEL NODE BIOPSY Bilateral 09/16/2018   Procedure: RIGHT MASTECTOMY WITH RIGHT AXILLARY SENTINEL LYMPH NODE BIOPSY AND LEFT RISK REDUCING MASTECTOMY;  Surgeon: Emelia Loron, MD;  Location: [facility];  Service: General;  Laterality: Bilateral;   Patient Active Problem List   Diagnosis Date Noted   Family history of cardiovascular disorder 09/12/2022   Breast cancer, right (HCC) 09/16/2018   Genetic testing 09/09/2018   Malignant neoplasm of upper-inner quadrant of right breast in female, estrogen receptor positive (HCC) 08/19/2018   Family history of breast cancer    History of penicillin allergy 09/24/2012   Exposure to strep throat 09/24/2012   Perioral dermatitis 09/24/2012   Left otitis media with effusion 08/16/2011   Hx of antibiotic allergy 05/03/2011   Disorder of teeth and supporting structures 03/30/2008   Allergic rhinitis 02/17/2008   Endometriosis 02/17/2008   Headache 02/17/2008   Hx  gestational diabetes 02/17/2008   POSITIVE PPD 12/17/2007    PCP: Madelin Headings, MD   REFERRING PROVIDER: Kristian Covey, MD  REFERRING DIAG: M54.9 (ICD-10-CM) - Upper back pain on right side  THERAPY DIAG:  Pain in thoracic spine  Abnormal posture  Muscle weakness (generalized)  Rationale for Evaluation and Treatment: Rehabilitation  ONSET DATE: 2 weeks  SUBJECTIVE:  SUBJECTIVE STATEMENT: Continues with little to no R sided symptoms.  L symptoms localized to posterior scapular region and reproduced with L cervical rotation.  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  This did follow lifting some heavy objects but she does not recall specific injury.  She then had hysteroscopy last week and thinks this may have exacerbated this pain possibly due to position she was in with her procedure.  Her pain is right periscapular region.  No neck pain.  No shoulder pain.  Denies any cough, fever, dyspnea, or pleuritic pain.   She tried yoga exercises, ibuprofen, heat, and topical sports cream type ointment without much improvement.  Pain has been worsening.  Does seem to be worse with change of position and movement.   She has past medical history significant for breast cancer and is on chronic tamoxifen.  PAIN:  Are you having pain? Yes: NPRS scale: 9/10 Pain location: R posterior shoulder  Pain description: ache Aggravating factors: prolonged positions, driving, desk work Relieving factors: heat, rest  PRECAUTIONS: None  RED FLAGS: None     WEIGHT BEARING RESTRICTIONS: No  FALLS:  Has patient fallen in last 6 months? No  OCCUPATION: office  work  PLOF: Independent  PATIENT GOALS: To manage my pain  NEXT MD VISIT: TBD  OBJECTIVE:  Note: Objective measures were completed at Evaluation unless otherwise noted.  DIAGNOSTIC FINDINGS:  IMPRESSION: 1. No acute cardiopulmonary disease. Specifically, no evidence of pneumonia. 2. Mild pectus deformity as demonstrated on cardiac CT performed 11/17/2022.     Electronically Signed   By: Simonne Come M.D.   On: 03/18/2023 16:34  PATIENT SURVEYS:  FOTO 37(66 predicted)  04/13/23 52%; 04/27/23 66%  POSTURE: rounded shoulders, forward head, and increased thoracic kyphosis  PALPATION: TTP R thoracic paraspinals, teres major/minor    CERVICAL ROM: WNL throughout  Active ROM A/PROM (deg) eval AROM  Flexion    Extension  100%  Right lateral flexion  50%  Left lateral flexion  50%  Right rotation  90%  Left rotation  90% P!   (Blank rows = not tested)  UPPER EXTREMITY ROM: WNL throughout  Active ROM Right eval Left eval  Shoulder flexion    Shoulder extension    Shoulder abduction    Shoulder adduction    Shoulder extension    Shoulder internal rotation    Shoulder external rotation    Elbow flexion    Elbow extension    Wrist flexion    Wrist extension    Wrist ulnar deviation    Wrist radial deviation    Wrist pronation    Wrist supination     (Blank rows = not tested)  UPPER EXTREMITY MMT:  MMT Right eval Left eval  Shoulder flexion    Shoulder extension    Shoulder abduction    Shoulder adduction    Shoulder extension    Shoulder internal rotation    Shoulder external rotation    Middle trapezius    Lower trapezius    Elbow flexion    Elbow extension    Wrist flexion    Wrist extension    Wrist ulnar deviation    Wrist radial deviation    Wrist pronation    Wrist supination    Grip strength     (Blank rows = not tested)  CERVICAL SPECIAL TESTS:  Upper limb tension test (ULTT): Negative  FUNCTIONAL TESTS:  30 seconds chair stand test  n/a  TODAY'S TREATMENT:    [facility] Adult PT Treatment:                                                DATE: 05/02/23 Therapeutic Exercise: L scalene stretch 3 heads 30s x3 Towel stretch into L cervical rotation 30s x2 Manual Therapy: Skilled palpation to identify taught and irritable bands in L infraspinatus, UT/levator Trigger Point Dry Needling  Subsequent Treatment: Instructions provided previously at initial dry needling treatment.   Patient Verbal Consent Given: Yes Education Handout Provided: Previously Provided Muscles Treated: L infraspinatus, UT/levator Electrical Stimulation Performed: No Treatment Response/Outcome: decreased pain and muscle tension     [facility] Adult PT Treatment:                                                DATE: 04/27/23 Therapeutic Exercise: B towel stretch for IR 30s hold PROM B scalene groups 30s x3  Manual Therapy: Skilled palpation to identify taught and irritable bands in B teres Major and R infraspinatus Trigger Point Dry Needling  Subsequent Treatment: Instructions provided previously at initial dry needling treatment.   Patient Verbal Consent Given: Yes Education Handout Provided: Yes Muscles Treated: B teres Major and R infraspinatus Electrical Stimulation Performed: No Treatment Response/Outcome: less discomfort as well as decrease twitch response     [facility] Adult PT Treatment:                                                DATE: 04/25/23  Manual Therapy: Skilled palpation to identify taught and irritable bands in L Teres Minor, L infraspinatus Trigger Point Dry Needling  Subsequent Treatment: Instructions provided previously at initial dry needling treatment.   Patient Verbal Consent Given: Yes Education Handout Provided: Previously Provided Muscles Treated: L Teres Minor, L infraspinatus Electrical Stimulation Performed: No Treatment Response/Outcome: decreased muscle tension and      Self Care: Review of posture while driving as  well as open book in standing  [facility] Adult PT Treatment:                                                DATE: 04/18/23 Therapeutic Exercise: Prone flexion B 10x Prone extension B 10x Prone Ws 10x B Prone Ts 10x B Supine OH flexion with breathing patterns followed to allow rib excursion 10x Manual Therapy: Skilled palpation to identify taught and irritable bands in B levator scapula and L teres major  Trigger Point Dry Needling  Patient Verbal Consent Given: Yes Education Handout Provided: Previously Provided Muscles Treated: B levator scapula and L teres major Electrical Stimulation Performed: No Treatment Response/Outcome: decreased tension and irritability to palpation in all 3 regions  [facility] Adult PT Treatment:  DATE: 04/13/23 Therapeutic Exercise: Prone Y 10x B Prone T 10x B Prone extension 10x B Prone W 10x B L levator stertch 30s x2 Supine OH flexion with breathing patterns 10x Manual Therapy: Skilled palpation to identify taught and irritable bands in R infraspinatus and teres major Trigger Point Dry Needling Treatment: Pre-treatment instruction: Patient instructed on dry needling rationale, procedures, and possible side effects including pain during treatment (achy,cramping feeling), bruising, drop of blood, lightheadedness, nausea, sweating. Patient Consent Given: Yes Education handout provided: Previously provided Muscles treated: R infraspinatus and teres major  Needle size and number: .25x10mm x 2 Electrical stimulation performed: No Parameters: N/A Treatment response/outcome: Twitch response elicited and Palpable decrease in muscle tension Post-treatment instructions: Patient instructed to expect possible mild to moderate muscle soreness later today and/or tomorrow. Patient instructed in methods to reduce muscle soreness and to continue prescribed HEP. If patient was dry needled over the lung field, patient was instructed on  signs and symptoms of pneumothorax and, however unlikely, to see immediate medical attention should they occur. Patient was also educated on signs and symptoms of infection and to seek medical attention should they occur. Patient verbalized understanding of these instructions and education.     [facility] Adult PT Treatment:                                                DATE: 04/06/23 Therapeutic Exercise: Doorway stretch 30s x 3 Manual Therapy: Skilled palpation to identify taught and irritable bands in B pec major/minor  Trigger Point Dry Needling Treatment: Pre-treatment instruction: Patient instructed on dry needling rationale, procedures, and possible side effects including pain during treatment (achy,cramping feeling), bruising, drop of blood, lightheadedness, nausea, sweating. Patient Consent Given: Yes Education handout provided: Previously provided Muscles treated: B pec major/minor, direct approach Needle size and number: .25x72mm x 2, .30x60mm x 2 Electrical stimulation performed: No Parameters: N/A Treatment response/outcome: Twitch response elicited and Palpable decrease in muscle tension Post-treatment instructions: Patient instructed to expect possible mild to moderate muscle soreness later today and/or tomorrow. Patient instructed in methods to reduce muscle soreness and to continue prescribed HEP. If patient was dry needled over the lung field, patient was instructed on signs and symptoms of pneumothorax and, however unlikely, to see immediate medical attention should they occur. Patient was also educated on signs and symptoms of infection and to seek medical attention should they occur. Patient verbalized understanding of these instructions and education.        [facility] Adult PT Treatment:                                                DATE: 04/02/23 Therapeutic Exercise: Egbert Garibaldi dog 10/10 Cat/cow 10x Open book 10/10 Manual Therapy: Skilled palpation to identify taught and irritable  bands in R iliocostalis b/t T12-8  Trigger Point Dry Needling Treatment: Pre-treatment instruction: Patient instructed on dry needling rationale, procedures, and possible side effects including pain during treatment (achy,cramping feeling), bruising, drop of blood, lightheadedness, nausea, sweating. Patient Consent Given: Yes Education handout provided: Yes Muscles treated: R iliocostalis b/t T 12-8  Needle size and number:  .30 x 30mm 3 Electrical stimulation performed: No Parameters: N/A Treatment response/outcome: Twitch response elicited and Palpable decrease in  muscle tension Post-treatment instructions: Patient instructed to expect possible mild to moderate muscle soreness later today and/or tomorrow. Patient instructed in methods to reduce muscle soreness and to continue prescribed HEP. If patient was dry needled over the lung field, patient was instructed on signs and symptoms of pneumothorax and, however unlikely, to see immediate medical attention should they occur. Patient was also educated on signs and symptoms of infection and to seek medical attention should they occur. Patient verbalized understanding of these instructions and education.                                                                                                                 DATE: 03/21/23 Eval and  HEP  PATIENT EDUCATION:  Education details: Discussed eval findings, rehab rationale and POC and patient is in agreement  Person educated: Patient Education method: Explanation Education comprehension: verbalized understanding and needs further education  HOME EXERCISE PROGRAM: Access Code: [email or web address] URL: [email or web address] Date: 04/13/2023 Prepared by: Gustavus Bryant  Exercises - Doorway Pec Stretch at 90 Degrees Abduction  - 2 x daily - 5 x weekly - 1 sets - 3 reps - 30s hold - Prone W Scapular Retraction  - 1 x daily - 5 x weekly - 2 sets - 10 reps - Prone Scapular Slide with Shoulder  Extension  - 1 x daily - 5 x weekly - 2 sets - 10 reps - Prone Scapular Retraction Y  - 1 x daily - 5 x weekly - 2 sets - 10 reps - Prone Shoulder Horizontal Abduction with Thumbs Up  - 1 x daily - 5 x weekly - 2 sets - 10 reps  ASSESSMENT:  CLINICAL IMPRESSION: Continues to note L sided symptoms with L cervical rotation during driving.  L cervical rotation limited slightly due to soft tissue restrictions.  Introduced towel stretch which restored full and equal cervical rotation.  Added additional DN to L UT and levator to address symptoms and mobility restrictions.  HEP updated with towel stretch.  Slow progress but symptoms are resolving with treatment.  Discussed role of stress regarding symptoms as marked tone noted in B scalene groups.  Patient is a 58 y.o. female who was seen today for physical therapy evaluation and treatment for posterior R shoulder pain. Patient presents with full cervical and BUE AROM.  Postural changes include rounded shoulders and kyphotic posture.  Palpation finds tenderness in thoracic paraspinals and teres groups presenting as myofascial trigger points.  Patient is s good candidate for OPPT to include TPDN.  OBJECTIVE IMPAIRMENTS: decreased activity tolerance, decreased knowledge of condition, increased fascial restrictions, increased muscle spasms, impaired UE functional use, postural dysfunction, and pain.   ACTIVITY LIMITATIONS: carrying, lifting, sitting, sleeping, and computer work  PERSONAL FACTORS: Age, Education, and Fitness are also affecting patient's functional outcome.   REHAB POTENTIAL: Good  CLINICAL DECISION MAKING: Stable/uncomplicated  EVALUATION COMPLEXITY: Low   GOALS: Goals reviewed with patient? No  SHORT TERM GOALS=LONG TERM GOALS: Target date: 05/02/2023    Patient to demonstrate  independence in HEP  Baseline: [id number] Goal status: MEt  2.  Patient will acknowledge 4/10 pain at least once during episode of care   Baseline: 9/10;  04/02/23 4/10 pain Goal status: Met  3.  Patient will score at least 77% on FOTO to signify clinically meaningful improvement in functional abilities.   Baseline: 36; 04/27/23 66% Goal status: Met  4.  Decrease TTP in affected regions to minimal Baseline: Moderate to severe Goal status: Ongoing     PLAN:  PT FREQUENCY: 1-2x/week  PT DURATION: 6 weeks  PLANNED INTERVENTIONS: 97164- PT Re-evaluation, 97110-Therapeutic exercises, 97530- Therapeutic activity, 97112- Neuromuscular re-education, 97535- Self Care, 54098- Manual therapy, 97032- Electrical stimulation (manual), Dry Needling, Joint mobilization, Spinal mobilization, Cryotherapy, and Moist heat  PLAN FOR NEXT SESSION: HEP review and update, manual techniques as appropriate, aerobic tasks, ROM and flexibility activities, strengthening and PREs, TPDN, gait and balance training as needed     Hildred Laser, PT 05/02/2023, 10:27 AM

## 2023-05-02 NOTE — Addendum Note (Signed)
Addended by: Hildred Laser on: 05/02/2023 09:52 AM   Modules accepted: Orders

## 2023-05-03 NOTE — Therapy (Signed)
[facility] OUTPATIENT PHYSICAL THERAPY TREATMENT NOTE   Patient Name: Megan Armstrong MRN: [id number] DOB:December 28, 1965, 58 y.o., female Today's Date: 05/04/2023  END OF SESSION:  PT End of Session - 05/04/23 0924     Visit Number 12    Number of Visits 12    Date for PT Re-Evaluation 05/22/23    Authorization Type cigna    PT Start Time 0920    PT Stop Time 1000    PT Time Calculation (min) 40 min    Activity Tolerance Patient tolerated treatment well    Behavior During Therapy WFL for tasks assessed/performed              Past Medical History:  Diagnosis Date   Allergic rhinitis    Endometrial hyperplasia    Endometriosis    Family history of breast cancer    Family history of breast cancer    Hx of endometriosis    rectal vag with laser surgery    Seasonal allergies    TB skin/subcutaneous    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   Past Surgical History:  Procedure Laterality Date   DIAGNOSTIC LAPAROSCOPY     MASTECTOMY W/ SENTINEL NODE BIOPSY Bilateral 09/16/2018   Procedure: RIGHT MASTECTOMY WITH RIGHT AXILLARY SENTINEL LYMPH NODE BIOPSY AND LEFT RISK REDUCING MASTECTOMY;  Surgeon: Emelia Loron, MD;  Location: [facility];  Service: General;  Laterality: Bilateral;   Patient Active Problem List   Diagnosis Date Noted   Family history of cardiovascular disorder 09/12/2022   Breast cancer, right (HCC) 09/16/2018   Genetic testing 09/09/2018   Malignant neoplasm of upper-inner quadrant of right breast in female, estrogen receptor positive (HCC) 08/19/2018   Family history of breast cancer    History of penicillin allergy 09/24/2012   Exposure to strep throat 09/24/2012   Perioral dermatitis 09/24/2012   Left otitis media with effusion 08/16/2011   Hx of antibiotic allergy 05/03/2011   Disorder of teeth and supporting structures 03/30/2008   Allergic rhinitis 02/17/2008   Endometriosis 02/17/2008   Headache 02/17/2008    Hx gestational diabetes 02/17/2008   POSITIVE PPD 12/17/2007    PCP: Madelin Headings, MD   REFERRING PROVIDER: Kristian Covey, MD  REFERRING DIAG: M54.9 (ICD-10-CM) - Upper back pain on right side  THERAPY DIAG:  Pain in thoracic spine  Abnormal posture  Muscle weakness (generalized)  Rationale for Evaluation and Treatment: Rehabilitation  ONSET DATE: 2 weeks  SUBJECTIVE:  SUBJECTIVE STATEMENT: Able to turn head to L while driving w/o noting symptoms  Hand dominance: Right  PERTINENT HISTORY:  Seen today with onset about a week and a half ago of right periscapular pain.  This did follow lifting some heavy objects but she does not recall specific injury.  She then had hysteroscopy last week and thinks this may have exacerbated this pain possibly due to position she was in with her procedure.  Her pain is right periscapular region.  No neck pain.  No shoulder pain.  Denies any cough, fever, dyspnea, or pleuritic pain.   She tried yoga exercises, ibuprofen, heat, and topical sports cream type ointment without much improvement.  Pain has been worsening.  Does seem to be worse with change of position and movement.   She has past medical history significant for breast cancer and is on chronic tamoxifen.  PAIN:  Are you having pain? Yes: NPRS scale: 9/10 Pain location: R posterior shoulder  Pain description: ache Aggravating factors: prolonged positions, driving, desk work Relieving factors: heat, rest  PRECAUTIONS: None  RED FLAGS: None     WEIGHT BEARING RESTRICTIONS: No  FALLS:  Has patient fallen in last 6 months? No  OCCUPATION: office work  PLOF: Independent  PATIENT GOALS: To manage my pain  NEXT MD VISIT: TBD  OBJECTIVE:  Note: Objective measures were  completed at Evaluation unless otherwise noted.  DIAGNOSTIC FINDINGS:  IMPRESSION: 1. No acute cardiopulmonary disease. Specifically, no evidence of pneumonia. 2. Mild pectus deformity as demonstrated on cardiac CT performed 11/17/2022.     Electronically Signed   By: Simonne Come M.D.   On: 03/18/2023 16:34  PATIENT SURVEYS:  FOTO 37(66 predicted)  04/13/23 52%; 04/27/23 66%  POSTURE: rounded shoulders, forward head, and increased thoracic kyphosis  PALPATION: TTP R thoracic paraspinals, teres major/minor    CERVICAL ROM: WNL throughout  Active ROM A/PROM (deg) eval AROM  Flexion    Extension  100%  Right lateral flexion  50%  Left lateral flexion  50%  Right rotation  90%  Left rotation  90% P!   (Blank rows = not tested)  UPPER EXTREMITY ROM: WNL throughout  Active ROM Right eval Left eval  Shoulder flexion    Shoulder extension    Shoulder abduction    Shoulder adduction    Shoulder extension    Shoulder internal rotation    Shoulder external rotation    Elbow flexion    Elbow extension    Wrist flexion    Wrist extension    Wrist ulnar deviation    Wrist radial deviation    Wrist pronation    Wrist supination     (Blank rows = not tested)  UPPER EXTREMITY MMT:  MMT Right eval Left eval  Shoulder flexion    Shoulder extension    Shoulder abduction    Shoulder adduction    Shoulder extension    Shoulder internal rotation    Shoulder external rotation    Middle trapezius    Lower trapezius    Elbow flexion    Elbow extension    Wrist flexion    Wrist extension    Wrist ulnar deviation    Wrist radial deviation    Wrist pronation    Wrist supination    Grip strength     (Blank rows = not tested)  CERVICAL SPECIAL TESTS:  Upper limb tension test (ULTT): Negative  FUNCTIONAL TESTS:  30 seconds chair stand test n/a  TODAY'S TREATMENT:  [facility] Adult PT Treatment:                                                DATE: 05/04/23 Therapeutic  Exercise: B scalene stretch 30s x3  Postural reeducation and demonstration of proper posture, focus on forward shoulder positioning Manual Therapy: Skilled palpation to identify taught and irritable bands in B UT  Trigger Point Dry Needling  Subsequent Treatment: Instructions provided previously at initial dry needling treatment.   Patient Verbal Consent Given: Yes Education Handout Provided: Previously Provided Muscles Treated: B UT Electrical Stimulation Performed: No Treatment Response/Outcome: decreased pain and muscle tension    [facility] Adult PT Treatment:                                                DATE: 05/02/23 Therapeutic Exercise: L scalene stretch 3 heads 30s x3 Towel stretch into L cervical rotation 30s x2 Manual Therapy: Skilled palpation to identify taught and irritable bands in L infraspinatus, UT/levator Trigger Point Dry Needling  Subsequent Treatment: Instructions provided previously at initial dry needling treatment.   Patient Verbal Consent Given: Yes Education Handout Provided: Previously Provided Muscles Treated: L infraspinatus, UT/levator Electrical Stimulation Performed: No Treatment Response/Outcome: decreased pain and muscle tension     [facility] Adult PT Treatment:                                                DATE: 04/27/23 Therapeutic Exercise: B towel stretch for IR 30s hold PROM B scalene groups 30s x3  Manual Therapy: Skilled palpation to identify taught and irritable bands in B teres Major and R infraspinatus Trigger Point Dry Needling  Subsequent Treatment: Instructions provided previously at initial dry needling treatment.   Patient Verbal Consent Given: Yes Education Handout Provided: Yes Muscles Treated: B teres Major and R infraspinatus Electrical Stimulation Performed: No Treatment Response/Outcome: less discomfort as well as decrease twitch response     [facility] Adult PT Treatment:                                                DATE:  04/25/23  Manual Therapy: Skilled palpation to identify taught and irritable bands in L Teres Minor, L infraspinatus Trigger Point Dry Needling  Subsequent Treatment: Instructions provided previously at initial dry needling treatment.   Patient Verbal Consent Given: Yes Education Handout Provided: Previously Provided Muscles Treated: L Teres Minor, L infraspinatus Electrical Stimulation Performed: No Treatment Response/Outcome: decreased muscle tension and      Self Care: Review of posture while driving as well as open book in standing  [facility] Adult PT Treatment:                                                DATE: 04/18/23 Therapeutic Exercise: Prone flexion B 10x Prone extension B 10x  Prone Ws 10x B Prone Ts 10x B Supine OH flexion with breathing patterns followed to allow rib excursion 10x Manual Therapy: Skilled palpation to identify taught and irritable bands in B levator scapula and L teres major  Trigger Point Dry Needling  Patient Verbal Consent Given: Yes Education Handout Provided: Previously Provided Muscles Treated: B levator scapula and L teres major Electrical Stimulation Performed: No Treatment Response/Outcome: decreased tension and irritability to palpation in all 3 regions  [facility] Adult PT Treatment:                                                DATE: 04/13/23 Therapeutic Exercise: Prone Y 10x B Prone T 10x B Prone extension 10x B Prone W 10x B L levator stertch 30s x2 Supine OH flexion with breathing patterns 10x Manual Therapy: Skilled palpation to identify taught and irritable bands in R infraspinatus and teres major Trigger Point Dry Needling Treatment: Pre-treatment instruction: Patient instructed on dry needling rationale, procedures, and possible side effects including pain during treatment (achy,cramping feeling), bruising, drop of blood, lightheadedness, nausea, sweating. Patient Consent Given: Yes Education handout provided: Previously  provided Muscles treated: R infraspinatus and teres major  Needle size and number: .25x62mm x 2 Electrical stimulation performed: No Parameters: N/A Treatment response/outcome: Twitch response elicited and Palpable decrease in muscle tension Post-treatment instructions: Patient instructed to expect possible mild to moderate muscle soreness later today and/or tomorrow. Patient instructed in methods to reduce muscle soreness and to continue prescribed HEP. If patient was dry needled over the lung field, patient was instructed on signs and symptoms of pneumothorax and, however unlikely, to see immediate medical attention should they occur. Patient was also educated on signs and symptoms of infection and to seek medical attention should they occur. Patient verbalized understanding of these instructions and education.     [facility] Adult PT Treatment:                                                DATE: 04/06/23 Therapeutic Exercise: Doorway stretch 30s x 3 Manual Therapy: Skilled palpation to identify taught and irritable bands in B pec major/minor  Trigger Point Dry Needling Treatment: Pre-treatment instruction: Patient instructed on dry needling rationale, procedures, and possible side effects including pain during treatment (achy,cramping feeling), bruising, drop of blood, lightheadedness, nausea, sweating. Patient Consent Given: Yes Education handout provided: Previously provided Muscles treated: B pec major/minor, direct approach Needle size and number: .25x24mm x 2, .30x81mm x 2 Electrical stimulation performed: No Parameters: N/A Treatment response/outcome: Twitch response elicited and Palpable decrease in muscle tension Post-treatment instructions: Patient instructed to expect possible mild to moderate muscle soreness later today and/or tomorrow. Patient instructed in methods to reduce muscle soreness and to continue prescribed HEP. If patient was dry needled over the lung field, patient was  instructed on signs and symptoms of pneumothorax and, however unlikely, to see immediate medical attention should they occur. Patient was also educated on signs and symptoms of infection and to seek medical attention should they occur. Patient verbalized understanding of these instructions and education.        [facility] Adult PT Treatment:  DATE: 04/02/23 Therapeutic Exercise: Egbert Garibaldi dog 10/10 Cat/cow 10x Open book 10/10 Manual Therapy: Skilled palpation to identify taught and irritable bands in R iliocostalis b/t T12-8  Trigger Point Dry Needling Treatment: Pre-treatment instruction: Patient instructed on dry needling rationale, procedures, and possible side effects including pain during treatment (achy,cramping feeling), bruising, drop of blood, lightheadedness, nausea, sweating. Patient Consent Given: Yes Education handout provided: Yes Muscles treated: R iliocostalis b/t T 12-8  Needle size and number:  .30 x 30mm 3 Electrical stimulation performed: No Parameters: N/A Treatment response/outcome: Twitch response elicited and Palpable decrease in muscle tension Post-treatment instructions: Patient instructed to expect possible mild to moderate muscle soreness later today and/or tomorrow. Patient instructed in methods to reduce muscle soreness and to continue prescribed HEP. If patient was dry needled over the lung field, patient was instructed on signs and symptoms of pneumothorax and, however unlikely, to see immediate medical attention should they occur. Patient was also educated on signs and symptoms of infection and to seek medical attention should they occur. Patient verbalized understanding of these instructions and education.                                                                                                                 DATE: 03/21/23 Eval and  HEP  PATIENT EDUCATION:  Education details: Discussed eval findings, rehab  rationale and POC and patient is in agreement  Person educated: Patient Education method: Explanation Education comprehension: verbalized understanding and needs further education  HOME EXERCISE PROGRAM: Access Code: [email or web address] URL: [email or web address] Date: 04/13/2023 Prepared by: Gustavus Bryant  Exercises - Doorway Pec Stretch at 90 Degrees Abduction  - 2 x daily - 5 x weekly - 1 sets - 3 reps - 30s hold - Prone W Scapular Retraction  - 1 x daily - 5 x weekly - 2 sets - 10 reps - Prone Scapular Slide with Shoulder Extension  - 1 x daily - 5 x weekly - 2 sets - 10 reps - Prone Scapular Retraction Y  - 1 x daily - 5 x weekly - 2 sets - 10 reps - Prone Shoulder Horizontal Abduction with Thumbs Up  - 1 x daily - 5 x weekly - 2 sets - 10 reps  ASSESSMENT:  CLINICAL IMPRESSION: Continues to note L sided symptoms with L cervical rotation during driving.  L cervical rotation limited slightly due to soft tissue restrictions.  Introduced towel stretch which restored full and equal cervical rotation.  Added additional DN to L UT and levator to address symptoms and mobility restrictions.  HEP updated with towel stretch.  Slow progress but symptoms are resolving with treatment.  Discussed role of stress regarding symptoms as marked tone noted in B scalene groups.  Patient is a 58 y.o. female who was seen today for physical therapy evaluation and treatment for posterior R shoulder pain. Patient presents with full cervical and BUE AROM.  Postural changes include rounded shoulders and kyphotic posture.  Palpation finds tenderness in thoracic paraspinals and teres groups presenting as  myofascial trigger points.  Patient is s good candidate for OPPT to include TPDN.  OBJECTIVE IMPAIRMENTS: decreased activity tolerance, decreased knowledge of condition, increased fascial restrictions, increased muscle spasms, impaired UE functional use, postural dysfunction, and pain.   ACTIVITY LIMITATIONS:  carrying, lifting, sitting, sleeping, and computer work  PERSONAL FACTORS: Age, Education, and Fitness are also affecting patient's functional outcome.   REHAB POTENTIAL: Good  CLINICAL DECISION MAKING: Stable/uncomplicated  EVALUATION COMPLEXITY: Low   GOALS: Goals reviewed with patient? No  SHORT TERM GOALS=LONG TERM GOALS: Target date: 05/02/2023    Patient to demonstrate independence in HEP  Baseline: [id number] Goal status: MEt  2.  Patient will acknowledge 4/10 pain at least once during episode of care   Baseline: 9/10; 04/02/23 4/10 pain Goal status: Met  3.  Patient will score at least 77% on FOTO to signify clinically meaningful improvement in functional abilities.   Baseline: 36; 04/27/23 66% Goal status: Met  4.  Decrease TTP in affected regions to minimal Baseline: Moderate to severe Goal status: Ongoing     PLAN:  PT FREQUENCY: 1-2x/week  PT DURATION: 6 weeks  PLANNED INTERVENTIONS: 97164- PT Re-evaluation, 97110-Therapeutic exercises, 97530- Therapeutic activity, 97112- Neuromuscular re-education, 97535- Self Care, [id number]- Manual therapy, 97032- Electrical stimulation (manual), Dry Needling, Joint mobilization, Spinal mobilization, Cryotherapy, and Moist heat  PLAN FOR NEXT SESSION: HEP review and update, manual techniques as appropriate, aerobic tasks, ROM and flexibility activities, strengthening and PREs, TPDN, gait and balance training as needed     Hildred Laser, PT 05/04/2023, 10:24 AM

## 2023-05-04 ENCOUNTER — Ambulatory Visit: Payer: Managed Care, Other (non HMO)

## 2023-05-04 DIAGNOSIS — M546 Pain in thoracic spine: Secondary | ICD-10-CM

## 2023-05-04 DIAGNOSIS — M6281 Muscle weakness (generalized): Secondary | ICD-10-CM

## 2023-05-04 DIAGNOSIS — R293 Abnormal posture: Secondary | ICD-10-CM

## 2023-05-14 NOTE — Therapy (Signed)
 [facility] TREATMENT NOTE/DISCHARGE SUMMARY   Patient Name: Megan Armstrong MRN: [id number] DOB:08-04-1965, 58 y.o., female Today's Date: 05/16/2023  END OF SESSION:  PT End of Session - 05/16/23 1226     Visit Number 13    Number of Visits 12    Date for PT Re-Evaluation 05/22/23    Authorization Type cigna    PT Start Time 1220    PT Stop Time 1300    PT Time Calculation (min) 40 min    Activity Tolerance Patient tolerated treatment well    Behavior During Therapy WFL for tasks assessed/performed               Past Medical History:  Diagnosis Date   Allergic rhinitis    Endometrial hyperplasia    Endometriosis    Family history of breast cancer    Family history of breast cancer    Hx of endometriosis    rectal vag with laser surgery    Seasonal allergies    TB skin/subcutaneous    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   Past Surgical History:  Procedure Laterality Date   DIAGNOSTIC LAPAROSCOPY     MASTECTOMY W/ SENTINEL NODE BIOPSY Bilateral 09/16/2018   Procedure: RIGHT MASTECTOMY WITH RIGHT AXILLARY SENTINEL LYMPH NODE BIOPSY AND LEFT RISK REDUCING MASTECTOMY;  Surgeon: Ebbie Cough, MD;  Location: [facility];  Service: General;  Laterality: Bilateral;   Patient Active Problem List   Diagnosis Date Noted   Family history of cardiovascular disorder 09/12/2022   Breast cancer, right (HCC) 09/16/2018   Genetic testing 09/09/2018   Malignant neoplasm of upper-inner quadrant of right breast in female, estrogen receptor positive (HCC) 08/19/2018   Family history of breast cancer    History of penicillin allergy 09/24/2012   Exposure to strep throat 09/24/2012   Perioral dermatitis 09/24/2012   Left otitis media with effusion 08/16/2011   Hx of antibiotic allergy 05/03/2011   Disorder of teeth and supporting structures 03/30/2008   Allergic rhinitis 02/17/2008   Endometriosis 02/17/2008    Headache 02/17/2008   Hx gestational diabetes 02/17/2008   POSITIVE PPD 12/17/2007    PCP: Charlett Apolinar POUR, MD   REFERRING PROVIDER: Micheal Wolm ORN, MD  REFERRING DIAG: M54.9 (ICD-10-CM) - Upper back pain on right side  THERAPY DIAG:  Pain in thoracic spine  Abnormal posture  Muscle weakness (generalized)  Rationale for Evaluation and Treatment: Rehabilitation  ONSET DATE: 2 weeks  SUBJECTIVE:  SUBJECTIVE STATEMENT: Able to turn head to L while driving w/o noting symptoms  Hand dominance: Right  PERTINENT HISTORY:  Seen today with onset about a week and a half ago of right periscapular pain.  This did follow lifting some heavy objects but she does not recall specific injury.  She then had hysteroscopy last week and thinks this may have exacerbated this pain possibly due to position she was in with her procedure.  Her pain is right periscapular region.  No neck pain.  No shoulder pain.  Denies any cough, fever, dyspnea, or pleuritic pain.   She tried yoga exercises, ibuprofen, heat, and topical sports cream type ointment without much improvement.  Pain has been worsening.  Does seem to be worse with change of position and movement.   She has past medical history significant for breast cancer and is on chronic tamoxifen .  PAIN:  Are you having pain? Yes: NPRS scale: 9/10 Pain location: R posterior shoulder  Pain description: ache Aggravating factors: prolonged positions, driving, desk work Relieving factors: heat, rest  PRECAUTIONS: None  RED FLAGS: None     WEIGHT BEARING RESTRICTIONS: No  FALLS:  Has patient fallen in last 6 months? No  OCCUPATION: office work  PLOF: Independent  PATIENT GOALS: To manage my pain  NEXT MD VISIT: TBD  OBJECTIVE:  Note:  Objective measures were completed at Evaluation unless otherwise noted.  DIAGNOSTIC FINDINGS:  IMPRESSION: 1. No acute cardiopulmonary disease. Specifically, no evidence of pneumonia. 2. Mild pectus deformity as demonstrated on cardiac CT performed 11/17/2022.     Electronically Signed   By: Norleen Roulette M.D.   On: 03/18/2023 16:34  PATIENT SURVEYS:  FOTO 37(66 predicted)  04/13/23 52%; 04/27/23 66%  POSTURE: rounded shoulders, forward head, and increased thoracic kyphosis  PALPATION: TTP R thoracic paraspinals, teres major/minor    CERVICAL ROM: WNL throughout  Active ROM A/PROM (deg) eval AROM  Flexion    Extension  100%  Right lateral flexion  50%  Left lateral flexion  50%  Right rotation  90%  Left rotation  90% P!   (Blank rows = not tested)  UPPER EXTREMITY ROM: WNL throughout  Active ROM Right eval Left eval  Shoulder flexion    Shoulder extension    Shoulder abduction    Shoulder adduction    Shoulder extension    Shoulder internal rotation    Shoulder external rotation    Elbow flexion    Elbow extension    Wrist flexion    Wrist extension    Wrist ulnar deviation    Wrist radial deviation    Wrist pronation    Wrist supination     (Blank rows = not tested)  UPPER EXTREMITY MMT:  MMT Right eval Left eval  Shoulder flexion    Shoulder extension    Shoulder abduction    Shoulder adduction    Shoulder extension    Shoulder internal rotation    Shoulder external rotation    Middle trapezius    Lower trapezius    Elbow flexion    Elbow extension    Wrist flexion    Wrist extension    Wrist ulnar deviation    Wrist radial deviation    Wrist pronation    Wrist supination    Grip strength     (Blank rows = not tested)  CERVICAL SPECIAL TESTS:  Upper limb tension test (ULTT): Negative  FUNCTIONAL TESTS:  30 seconds chair stand test n/a  TODAY'S TREATMENT:  [facility] Adult PT Treatment:                                                 DATE: 05/16/23 Manual Therapy: Skilled palpation to identify taught and irritable bands in B infraspinatus and R teres major.  Palpation of distinct TrPs noted which reproduced patients symptoms.  Re-assessment following DN noted marked decrease in tissue irritability and symptom reproduction. Trigger Point Dry Needling  Subsequent Treatment: Instructions provided previously at initial dry needling treatment.   Patient Verbal Consent Given: Yes Education Handout Provided: Previously Provided Muscles Treated: B infraspinatus and R teres major Electrical Stimulation Performed: No Treatment Response/Outcome: marked decrease in tissue irritability and symptom reproduction    Self Care: Discussion of post therapy transition to fitness routine to incorporate benefit of aerobic work and advancement of upper body strength and posture training activities.  Outlined activities to pursue and focus on to address ongoing dysfunction.   [facility] Adult PT Treatment:                                                DATE: 05/04/23 Therapeutic Exercise: B scalene stretch 30s x3  Postural reeducation and demonstration of proper posture, focus on forward shoulder positioning Manual Therapy: Skilled palpation to identify taught and irritable bands in B UT  Trigger Point Dry Needling  Subsequent Treatment: Instructions provided previously at initial dry needling treatment.   Patient Verbal Consent Given: Yes Education Handout Provided: Previously Provided Muscles Treated: B UT Electrical Stimulation Performed: No Treatment Response/Outcome: decreased pain and muscle tension    [facility] Adult PT Treatment:                                                DATE: 05/02/23 Therapeutic Exercise: L scalene stretch 3 heads 30s x3 Towel stretch into L cervical rotation 30s x2 Manual Therapy: Skilled palpation to identify taught and irritable bands in L infraspinatus, UT/levator Trigger Point Dry Needling  Subsequent  Treatment: Instructions provided previously at initial dry needling treatment.   Patient Verbal Consent Given: Yes Education Handout Provided: Previously Provided Muscles Treated: L infraspinatus, UT/levator Electrical Stimulation Performed: No Treatment Response/Outcome: decreased pain and muscle tension     [facility] Adult PT Treatment:                                                DATE: 04/27/23 Therapeutic Exercise: B towel stretch for IR 30s hold PROM B scalene groups 30s x3  Manual Therapy: Skilled palpation to identify taught and irritable bands in B teres Major and R infraspinatus Trigger Point Dry Needling  Subsequent Treatment: Instructions provided previously at initial dry needling treatment.   Patient Verbal Consent Given: Yes Education Handout Provided: Yes Muscles Treated: B teres Major and R infraspinatus Electrical Stimulation Performed: No Treatment Response/Outcome: less discomfort as well as decrease twitch response     [facility] Adult PT Treatment:  DATE: 04/25/23  Manual Therapy: Skilled palpation to identify taught and irritable bands in L Teres Minor, L infraspinatus Trigger Point Dry Needling  Subsequent Treatment: Instructions provided previously at initial dry needling treatment.   Patient Verbal Consent Given: Yes Education Handout Provided: Previously Provided Muscles Treated: L Teres Minor, L infraspinatus Electrical Stimulation Performed: No Treatment Response/Outcome: decreased muscle tension and      Self Care: Review of posture while driving as well as open book in standing  [facility] Adult PT Treatment:                                                DATE: 04/18/23 Therapeutic Exercise: Prone flexion B 10x Prone extension B 10x Prone Ws 10x B Prone Ts 10x B Supine OH flexion with breathing patterns followed to allow rib excursion 10x Manual Therapy: Skilled palpation to identify taught and irritable  bands in B levator scapula and L teres major  Trigger Point Dry Needling  Patient Verbal Consent Given: Yes Education Handout Provided: Previously Provided Muscles Treated: B levator scapula and L teres major Electrical Stimulation Performed: No Treatment Response/Outcome: decreased tension and irritability to palpation in all 3 regions  [facility] Adult PT Treatment:                                                DATE: 04/13/23 Therapeutic Exercise: Prone Y 10x B Prone T 10x B Prone extension 10x B Prone W 10x B L levator stertch 30s x2 Supine OH flexion with breathing patterns 10x Manual Therapy: Skilled palpation to identify taught and irritable bands in R infraspinatus and teres major Trigger Point Dry Needling Treatment: Pre-treatment instruction: Patient instructed on dry needling rationale, procedures, and possible side effects including pain during treatment (achy,cramping feeling), bruising, drop of blood, lightheadedness, nausea, sweating. Patient Consent Given: Yes Education handout provided: Previously provided Muscles treated: R infraspinatus and teres major  Needle size and number: .25x79mm x 2 Electrical stimulation performed: No Parameters: N/A Treatment response/outcome: Twitch response elicited and Palpable decrease in muscle tension Post-treatment instructions: Patient instructed to expect possible mild to moderate muscle soreness later today and/or tomorrow. Patient instructed in methods to reduce muscle soreness and to continue prescribed HEP. If patient was dry needled over the lung field, patient was instructed on signs and symptoms of pneumothorax and, however unlikely, to see immediate medical attention should they occur. Patient was also educated on signs and symptoms of infection and to seek medical attention should they occur. Patient verbalized understanding of these instructions and education.     [facility] Adult PT Treatment:                                                 DATE: 04/06/23 Therapeutic Exercise: Doorway stretch 30s x 3 Manual Therapy: Skilled palpation to identify taught and irritable bands in B pec major/minor  Trigger Point Dry Needling Treatment: Pre-treatment instruction: Patient instructed on dry needling rationale, procedures, and possible side effects including pain during treatment (achy,cramping feeling), bruising, drop of blood, lightheadedness, nausea, sweating. Patient Consent Given: Yes Education handout provided:  Previously provided Muscles treated: B pec major/minor, direct approach Needle size and number: .25x65mm x 2, .30x61mm x 2 Electrical stimulation performed: No Parameters: N/A Treatment response/outcome: Twitch response elicited and Palpable decrease in muscle tension Post-treatment instructions: Patient instructed to expect possible mild to moderate muscle soreness later today and/or tomorrow. Patient instructed in methods to reduce muscle soreness and to continue prescribed HEP. If patient was dry needled over the lung field, patient was instructed on signs and symptoms of pneumothorax and, however unlikely, to see immediate medical attention should they occur. Patient was also educated on signs and symptoms of infection and to seek medical attention should they occur. Patient verbalized understanding of these instructions and education.        [facility] Adult PT Treatment:                                                DATE: 04/02/23 Therapeutic Exercise: Eluterio dog 10/10 Cat/cow 10x Open book 10/10 Manual Therapy: Skilled palpation to identify taught and irritable bands in R iliocostalis b/t T12-8  Trigger Point Dry Needling Treatment: Pre-treatment instruction: Patient instructed on dry needling rationale, procedures, and possible side effects including pain during treatment (achy,cramping feeling), bruising, drop of blood, lightheadedness, nausea, sweating. Patient Consent Given: Yes Education handout provided:  Yes Muscles treated: R iliocostalis b/t T 12-8  Needle size and number:  .30 x 30mm 3 Electrical stimulation performed: No Parameters: N/A Treatment response/outcome: Twitch response elicited and Palpable decrease in muscle tension Post-treatment instructions: Patient instructed to expect possible mild to moderate muscle soreness later today and/or tomorrow. Patient instructed in methods to reduce muscle soreness and to continue prescribed HEP. If patient was dry needled over the lung field, patient was instructed on signs and symptoms of pneumothorax and, however unlikely, to see immediate medical attention should they occur. Patient was also educated on signs and symptoms of infection and to seek medical attention should they occur. Patient verbalized understanding of these instructions and education.                                                                                                                 DATE: 03/21/23 Eval and  HEP  PATIENT EDUCATION:  Education details: Discussed eval findings, rehab rationale and POC and patient is in agreement  Person educated: Patient Education method: Explanation Education comprehension: verbalized understanding and needs further education  HOME EXERCISE PROGRAM: Access Code: [email or web address] URL: [email or web address] Date: 04/13/2023 Prepared by: Reyes Kohut  Exercises - Doorway Pec Stretch at 90 Degrees Abduction  - 2 x daily - 5 x weekly - 1 sets - 3 reps - 30s hold - Prone W Scapular Retraction  - 1 x daily - 5 x weekly - 2 sets - 10 reps - Prone Scapular Slide with Shoulder Extension  - 1 x daily - 5 x weekly - 2 sets - 10 reps -  Prone Scapular Retraction Y  - 1 x daily - 5 x weekly - 2 sets - 10 reps - Prone Shoulder Horizontal Abduction with Thumbs Up  - 1 x daily - 5 x weekly - 2 sets - 10 reps  ASSESSMENT:  CLINICAL IMPRESSION: Rehab goals met.  Patient ready for transition to self guided care.  Patient is a 58  y.o. female who was seen today for physical therapy evaluation and treatment for posterior R shoulder pain. Patient presents with full cervical and BUE AROM.  Postural changes include rounded shoulders and kyphotic posture.  Palpation finds tenderness in thoracic paraspinals and teres groups presenting as myofascial trigger points.  Patient is s good candidate for OPPT to include TPDN.  OBJECTIVE IMPAIRMENTS: decreased activity tolerance, decreased knowledge of condition, increased fascial restrictions, increased muscle spasms, impaired UE functional use, postural dysfunction, and pain.   ACTIVITY LIMITATIONS: carrying, lifting, sitting, sleeping, and computer work  PERSONAL FACTORS: Age, Education, and Fitness are also affecting patient's functional outcome.   REHAB POTENTIAL: Good  CLINICAL DECISION MAKING: Stable/uncomplicated  EVALUATION COMPLEXITY: Low   GOALS: Goals reviewed with patient? No  SHORT TERM GOALS=LONG TERM GOALS: Target date: 05/02/2023    Patient to demonstrate independence in HEP  Baseline: [email or web address] Goal status: MEt  2.  Patient will acknowledge 4/10 pain at least once during episode of care   Baseline: 9/10; 04/02/23 4/10 pain Goal status: Met  3.  Patient will score at least 77% on FOTO to signify clinically meaningful improvement in functional abilities.   Baseline: 36; 04/27/23 66% Goal status: Met  4.  Decrease TTP in affected regions to minimal Baseline: Moderate to severe; 05/16/23 Minimal tenderness throughout posterior shoulder region Goal status: Met     PLAN:  PT FREQUENCY: 1-2x/week  PT DURATION: 6 weeks  PLANNED INTERVENTIONS: 97164- PT Re-evaluation, 97110-Therapeutic exercises, 97530- Therapeutic activity, 97112- Neuromuscular re-education, 97535- Self Care, [id number]- Manual therapy, 97032- Electrical stimulation (manual), Dry Needling, Joint mobilization, Spinal mobilization, Cryotherapy, and Moist heat  PLAN FOR NEXT SESSION: HEP review  and update, manual techniques as appropriate, aerobic tasks, ROM and flexibility activities, strengthening and PREs, TPDN, gait and balance training as needed     Reyes CHRISTELLA Kohut, PT 05/16/2023, 1:18 PM

## 2023-05-16 ENCOUNTER — Ambulatory Visit: Payer: Managed Care, Other (non HMO) | Attending: Family Medicine

## 2023-05-16 DIAGNOSIS — M6281 Muscle weakness (generalized): Secondary | ICD-10-CM | POA: Diagnosis present

## 2023-05-16 DIAGNOSIS — R293 Abnormal posture: Secondary | ICD-10-CM | POA: Insufficient documentation

## 2023-05-16 DIAGNOSIS — M546 Pain in thoracic spine: Secondary | ICD-10-CM | POA: Insufficient documentation

## 2023-06-21 LAB — LAB REPORT - SCANNED: EGFR: 91

## 2023-06-21 LAB — COMPREHENSIVE METABOLIC PANEL WITH GFR: eGFR: 91

## 2023-06-21 LAB — BASIC METABOLIC PANEL WITH GFR: Creatinine: 0.8 (ref 0.5–1.1)

## 2023-06-21 LAB — LIPID PANEL: LDL Cholesterol: 122

## 2023-06-21 LAB — VITAMIN D 25 HYDROXY (VIT D DEFICIENCY, FRACTURES): Vit D, 25-Hydroxy: 30

## 2023-06-22 ENCOUNTER — Other Ambulatory Visit (HOSPITAL_BASED_OUTPATIENT_CLINIC_OR_DEPARTMENT_OTHER): Payer: Self-pay | Admitting: Family Medicine

## 2023-06-22 DIAGNOSIS — R7989 Other specified abnormal findings of blood chemistry: Secondary | ICD-10-CM

## 2023-06-29 ENCOUNTER — Encounter: Payer: Self-pay | Admitting: Family Medicine

## 2023-06-29 ENCOUNTER — Encounter (HOSPITAL_BASED_OUTPATIENT_CLINIC_OR_DEPARTMENT_OTHER): Payer: Self-pay

## 2023-06-29 ENCOUNTER — Ambulatory Visit: Admitting: Family Medicine

## 2023-06-29 VITALS — BP 120/60 | HR 85 | Temp 98.1°F | Wt 124.1 lb

## 2023-06-29 DIAGNOSIS — M549 Dorsalgia, unspecified: Secondary | ICD-10-CM | POA: Diagnosis not present

## 2023-06-29 DIAGNOSIS — R0789 Other chest pain: Secondary | ICD-10-CM | POA: Diagnosis not present

## 2023-06-29 NOTE — Progress Notes (Signed)
 Established Patient Office Visit  Subjective   Patient ID: Megan Armstrong, female    DOB: [birth year]  Age: 58 y.o. MRN: [id number]  No chief complaint on file.   HPI   Megan Armstrong has history of breast cancer with prior bilateral mastectomies.  She is maintained on tamoxifen.  She is seen today with recent episode last Thursday of chest pain which lasted 1 hour.  She was sitting at work when she felt a tightness somewhat bilaterally across her chest with some radiation toward the right jaw.  Her husband is a physician and patient was actually seen by nurse practitioner at their clinic.  She had EKG which was reportedly unremarkable.  She had multiple labs done with D-dimer 1.03.  Her total cholesterol 198 with HDL 47 and LDL 122.  Her chemistries and troponin were unremarkable.  CBC unremarkable.  She also had multiple other labs including B12, thyroid, folate, TSH which were all normal.  She was scheduled for venous Dopplers and CT angiogram chest which she plans to get tomorrow.  Denies any lower extremity swelling or calf pain.  No pleuritic pain.  In fact, when she had the 1 hour of chest pain last Thursday she noticed some improvement with deep breathing.  She has had bilateral mastectomies previously.  Denies any recent cough or fever.  She does yoga for exercise and denies any exertional chest pains recently.  Non-smoker.  No history of diabetes.  Coronary calcium score of 0 last year  Strong family history of coronary disease with uncle dying at 65.  She has had several aunts and uncles on her mom side who had coronary disease.  Patient also has had some ongoing upper back and right shoulder pains.  She saw physical therapy last year had great improvement.  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 She has never used smokeless tobacco. She reports that she does not drink alcohol and does not use drugs. family history includes Asthma in an other family member; Breast cancer (age of onset: 54) in her cousin; Diabetes in an other family member; Hypertension in an other family member; Migraines in her sister; Sudden death in an other family member. Allergies  Allergen Reactions   Codeine    Penicillins     REACTION: rash and swelling to amoxicillin   Clindamycin Rash   Clindamycin/Lincomycin Rash   Keflex [Cephalexin] Rash   Penicillin G Rash    Review of Systems  Constitutional:  Negative for chills, fever and weight loss.  Respiratory:  Negative for cough, hemoptysis, shortness of breath and wheezing.   Cardiovascular:  Positive for chest pain. Negative for leg swelling.       See  HPI  Gastrointestinal:  Negative for abdominal pain.      Objective:     BP 120/60 (BP Location: Left Arm, Patient Position: Sitting, Cuff Size: Normal)   Pulse 85   Temp 98.1 F (36.7 C) (Oral)   Wt 124 lb 1.6 oz (56.3 kg)   SpO2 98%   BMI 22.34 kg/m  BP Readings from Last 3 Encounters:  06/29/23 120/60  03/29/23 125/60  03/02/23 (!) 140/78   Wt Readings from Last 3 Encounters:  06/29/23 124 lb 1.6 oz (56.3 kg)  03/29/23 122 lb 8 oz (55.6 kg)  03/02/23 120 lb (54.4 kg)      Physical Exam Vitals reviewed.  Constitutional:      General: She is  not in acute distress.    Appearance: She is not ill-appearing.  Cardiovascular:     Rate and Rhythm: Normal rate and regular rhythm.  Pulmonary:     Effort: Pulmonary effort is normal.     Breath sounds: Normal breath sounds. No wheezing or rales.  Musculoskeletal:     Right lower leg: No edema.     Left lower leg: No edema.  Neurological:     Mental Status: She is alert.      No results found for any visits on 06/29/23.    The 10-year ASCVD risk score (Arnett DK, et al., 2019) is: 2.7%    Assessment & Plan:   #1 recent atypical chest pain a week ago Thursday lasting 1 hour at rest.  She has had no pain with exertion.  Coronary calcium score last year of 0.  She did have workup as above with D-dimer 1.03.  No hypoxemia.  EKG unremarkable.  Does have risk factor of tamoxifen therapy and history of breast cancer.  Low clinical suspicion of PE with no persistent symptoms and no evidence for any extremity edema or calf pain.  We did explain that D-dimer itself is a fairly nonspecific test with lots of false positives especially in the lower range of elevation.  She does have pending studies with venous Dopplers and CTA tomorrow.  Follow-up immediately for any recurrent pain.  #2 upper back pain.  Musculoskeletal.  Has benefited from PT in the past and requesting repeat referral.  Referral is placed.  Megan Peat, MD

## 2023-06-30 ENCOUNTER — Ambulatory Visit (HOSPITAL_BASED_OUTPATIENT_CLINIC_OR_DEPARTMENT_OTHER)
Admission: RE | Admit: 2023-06-30 | Discharge: 2023-06-30 | Disposition: A | Discharge status: Home / Self Care | Source: Ambulatory Visit | Attending: Family Medicine | Admitting: Family Medicine

## 2023-06-30 DIAGNOSIS — R7989 Other specified abnormal findings of blood chemistry: Secondary | ICD-10-CM | POA: Diagnosis present

## 2023-06-30 MED ORDER — IOHEXOL 350 MG/ML SOLN
75.0000 mL | Freq: Once | INTRAVENOUS | Status: AC | PRN
Start: 2023-06-30 — End: 2023-06-30
  Administered 2023-06-30: 75 mL via INTRAVENOUS

## 2023-07-09 ENCOUNTER — Other Ambulatory Visit: Payer: Self-pay

## 2023-07-09 ENCOUNTER — Ambulatory Visit: Attending: Family Medicine

## 2023-07-09 DIAGNOSIS — M549 Dorsalgia, unspecified: Secondary | ICD-10-CM | POA: Diagnosis not present

## 2023-07-09 DIAGNOSIS — M6281 Muscle weakness (generalized): Secondary | ICD-10-CM | POA: Insufficient documentation

## 2023-07-09 DIAGNOSIS — M546 Pain in thoracic spine: Secondary | ICD-10-CM | POA: Diagnosis present

## 2023-07-09 DIAGNOSIS — R293 Abnormal posture: Secondary | ICD-10-CM | POA: Insufficient documentation

## 2023-07-09 NOTE — Therapy (Unsigned)
 OUTPATIENT PHYSICAL THERAPY TREATMENT NOTE   Patient Name: Nola Botkins MRN: [id number] DOB:05-Jan-1966, 58 y.o., female Today's Date: 07/13/2023  END OF SESSION:  PT End of Session - 07/13/23 0752     Visit Number 2    Number of Visits 12    Date for PT Re-Evaluation 09/08/23    Authorization Type cigna    PT Start Time [phone]    PT Stop Time 0830    PT Time Calculation (min) 39 min    Activity Tolerance Patient tolerated treatment well    Behavior During Therapy WFL for tasks assessed/performed              Past Medical History:  Diagnosis Date   Allergic rhinitis    Endometrial hyperplasia    Endometriosis    Family history of breast cancer    Family history of breast cancer    Hx of endometriosis    rectal vag with laser surgery    Seasonal allergies    TB skin/subcutaneous    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   Past Surgical History:  Procedure Laterality Date   DIAGNOSTIC LAPAROSCOPY     MASTECTOMY W/ SENTINEL NODE BIOPSY Bilateral 09/16/2018   Procedure: RIGHT MASTECTOMY WITH RIGHT AXILLARY SENTINEL LYMPH NODE BIOPSY AND LEFT RISK REDUCING MASTECTOMY;  Surgeon: Emelia Loron, MD;  Location:  [facility];  Service: General;  Laterality: Bilateral;   Patient Active Problem List   Diagnosis Date Noted   Family history of cardiovascular disorder 09/12/2022   Breast cancer, right (HCC) 09/16/2018   Genetic testing 09/09/2018   Malignant neoplasm of upper-inner quadrant of right breast in female, estrogen receptor positive (HCC) 08/19/2018   Family history of breast cancer    History of penicillin allergy 09/24/2012   Exposure to strep throat 09/24/2012   Perioral dermatitis 09/24/2012   Left otitis media with effusion 08/16/2011   Hx of antibiotic allergy 05/03/2011   Disorder of teeth and supporting structures 03/30/2008   Allergic rhinitis 02/17/2008   Endometriosis 02/17/2008   Headache 02/17/2008   Hx  gestational diabetes 02/17/2008   POSITIVE PPD 12/17/2007    PCP: Madelin Headings, MD   REFERRING PROVIDER: Kristian Covey, MD  REFERRING DIAG: M54.9 (ICD-10-CM) - Upper back pain   THERAPY DIAG:  Pain in thoracic spine  Abnormal posture  Muscle weakness (generalized)  Rationale for Evaluation and Treatment: Rehabilitation  ONSET DATE: chronic  SUBJECTIVE:  SUBJECTIVE STATEMENT: Has been compliant with HEP and understands need to engage in strength training for long term relief  Hand dominance: Right  PERTINENT HISTORY:  #2 upper back pain.  Musculoskeletal.  Has benefited from PT in the past and requesting repeat referral.  Referral is placed.   PAIN:  Are you having pain? Yes: NPRS scale: 3/10 Pain location: B scapular regions/flank Pain description: dull/achy Aggravating factors: bending, twisting Relieving factors: position changes  PRECAUTIONS: None  RED FLAGS: None     WEIGHT BEARING RESTRICTIONS: No  FALLS:  Has patient fallen in last 6 months? No  OCCUPATION: office admin  PLOF: Independent  PATIENT GOALS: To manage my back pain  NEXT MD VISIT: TBD  OBJECTIVE:  Note: Objective measures were completed at Evaluation unless otherwise noted.  DIAGNOSTIC FINDINGS:  none  PATIENT SURVEYS:   ODI: 4/50  POSTURE: rounded shoulders, forward head, and increased thoracic kyphosis  PALPATION: TTP B rhomboids R>L  CERVICAL ROM:   Active ROM A/PROM (deg) eval  Flexion 90%  Extension 90%  Right lateral flexion 75%  Left lateral flexion 50%  Right rotation 75%  Left rotation 90%   (Blank rows = not tested)  UPPER EXTREMITY ROM: WNL B  Active ROM Right eval Left eval  Shoulder flexion    Shoulder extension    Shoulder abduction     Shoulder adduction    Shoulder extension    Shoulder internal rotation    Shoulder external rotation    Elbow flexion    Elbow extension    Wrist flexion    Wrist extension    Wrist ulnar deviation    Wrist radial deviation    Wrist pronation    Wrist supination     (Blank rows = not tested)  UPPER EXTREMITY MMT:  MMT Right eval Left eval  Shoulder flexion    Shoulder extension    Shoulder abduction    Shoulder adduction    Shoulder extension    Shoulder internal rotation    Shoulder external rotation    Middle trapezius 4 4  Lower trapezius 4 4  Elbow flexion    Elbow extension    Wrist flexion    Wrist extension    Wrist ulnar deviation    Wrist radial deviation    Wrist pronation    Wrist supination    Grip strength     (Blank rows = not tested)  CERVICAL SPECIAL TESTS:  Neck flexor muscle endurance test: Negative and Spurling's test: Negative  FUNCTIONAL TESTS:  30 seconds chair stand test n/a  TREATMENT DATE:  [facility] Adult PT Treatment:                                                DATE: 07/13/23 Therapeutic Exercise: UBE L1 3/3 min Manual Therapy: Skilled palpation to identify taught and irritable bands in B infraspinatus Trigger Point Dry Needling  Initial Treatment: Pt instructed on Dry Needling rational, procedures, and possible side effects. Pt instructed to expect mild to moderate muscle soreness later in the day and/or into the next day.  Pt instructed in methods to reduce muscle soreness. Pt instructed to continue prescribed HEP. Because Dry Needling was performed over or adjacent to a lung field, pt was educated on S/S of pneumothorax and to seek immediate medical attention should they occur.  Patient was educated  on signs and symptoms of infection and other risk factors and advised to seek medical attention should they occur.  Patient verbalized understanding of these instructions and education.   Patient Verbal Consent Given: Yes Education  Handout Provided: Previously Provided Muscles Treated: B infraspinatus Electrical Stimulation Performed: No Treatment Response/Outcome: decreased pain and soft tissue tension     Therapeutic Activity: Omega high row 15# 15x Omega low row 15# 15x Omega lat pulldown 15# 15x Seated t-spine extension 10x with light PT OP  [facility] Adult PT Treatment:                                                DATE: 07/09/22 Eval and HEP Self Care: Additional minutes spent for educating on updated Therapeutic Home Exercise Program as well as comparing current status to condition at start of symptoms. This included exercises focusing on stretching, strengthening, with focus on eccentric aspects. Long term goals include an improvement in range of motion, strength, endurance as well as avoiding reinjury. Patient's frequency would include in 1-2 times a day, 3-5 times a week for a duration of 6-12 weeks. Proper technique shown and discussed handout in great detail. All questions were discussed and addressed.                                                                                                                                 PATIENT EDUCATION:  Education details: Discussed eval findings, rehab rationale and POC and patient is in agreement  Person educated: Patient Education method: Explanation Education comprehension: verbalized understanding and needs further education  HOME EXERCISE PROGRAM: Access Code: [email or web address] URL: [email or web address] Date: 07/09/2023 Prepared by: Gustavus Bryant  Exercises - Doorway Pec Stretch at 90 Degrees Abduction  - 2 x daily - 5 x weekly - 1 sets - 3 reps - 30s hold - Supine Shoulder Horizontal Abduction with Resistance  - 1 x daily - 5 x weekly - 2 sets - 15 reps - Shoulder extension with resistance - Neutral  - 1 x daily - 5 x weekly - 2 sets - 15 reps  ASSESSMENT:  CLINICAL IMPRESSION: First f/u session since IE.  Began posterior shoulder  strengthening and postural retraining.  Incorporated aerobic w/u f/b isotonic strengthening of upper back and posterior shoulders focused on retraction.  Introduced thoracic extension mobilizations in seated position.  Patient is a 58 y.o. female who was seen today for physical therapy evaluation and treatment for chronic thoracic and flank pain. Patient presents with good shoulder mobility B.  ROM restrictions noted in cervical spine and weakness identified in posterior shoulders including rhomboids and middle traps.  Palpation finds TrPs to B rhomboids R>L.  Postural deficits of elevated and forward shoulder identified.  OBJECTIVE IMPAIRMENTS: decreased activity tolerance, decreased knowledge of condition, decreased strength, increased fascial  restrictions, impaired perceived functional ability, postural dysfunction, and pain.   ACTIVITY LIMITATIONS: carrying, lifting, bending, and sitting  PERSONAL FACTORS: Age, Behavior pattern, Fitness, and Past/current experiences are also affecting patient's functional outcome.   REHAB POTENTIAL: Good  CLINICAL DECISION MAKING: Stable/uncomplicated  EVALUATION COMPLEXITY: Low   GOALS: Goals reviewed with patient? No  SHORT TERM GOALS: Target date: 07/30/2023  Patient to demonstrate independence in HEP  Baseline:  Goal status: INITIAL   LONG TERM GOALS: Target date: 08/20/2023  Patient will score at least 2/50 on NDI to signify clinically meaningful improvement in functional abilities.   Baseline: 4/50 Goal status: INITIAL  2.  Patient will acknowledge 2/10 pain at least once during episode of care   Baseline: 3/10 Goal status: INITIAL  3.  Increase cervical AROM to 90% Baseline:  Active ROM A/PROM (deg) eval  Flexion 90%  Extension 90%  Right lateral flexion 75%  Left lateral flexion 50%  Right rotation 75%  Left rotation 90%   Goal status: INITIAL  4.  Increase strength to 4+/5 in deficit regions Baseline:  MMT Right eval  Left eval  Shoulder flexion    Shoulder extension    Shoulder abduction    Shoulder adduction    Shoulder extension    Shoulder internal rotation    Shoulder external rotation    Middle trapezius 4 4  Lower trapezius 4 4   Goal status: INITIAL     PLAN:  PT FREQUENCY: 2x/week  PT DURATION: 6 weeks  PLANNED INTERVENTIONS: 97164- PT Re-evaluation, 97110-Therapeutic exercises, 97530- Therapeutic activity, 97112- Neuromuscular re-education, 97535- Self Care, 40981- Manual therapy, Patient/Family education, Dry Needling, Joint mobilization, and Spinal mobilization  PLAN FOR NEXT SESSION: HEP review and update, manual techniques as appropriate, aerobic tasks, ROM and flexibility activities, strengthening and PREs, TPDN, gait and balance training as needed     Hildred Laser, PT 07/13/2023, 8:36 AM

## 2023-07-09 NOTE — Therapy (Signed)
 OUTPATIENT PHYSICAL THERAPY CERVICAL EVALUATION   Patient Name: Megan Armstrong MRN: [id number] DOB:1966-03-28, 58 y.o., female Today's Date: 07/09/2023  END OF SESSION:  PT End of Session - 07/09/23 0754     Visit Number 1    Number of Visits 12    Date for PT Re-Evaluation 09/08/23    Authorization Type cigna    PT Start Time [id number]   late for session   PT Stop Time 0830    PT Time Calculation (min) 35 min    Activity Tolerance Patient tolerated treatment well    Behavior During Therapy WFL for tasks assessed/performed             Past Medical History:  Diagnosis Date   Allergic rhinitis    Endometrial hyperplasia    Endometriosis    Family history of breast cancer    Family history of breast cancer    Hx of endometriosis    rectal vag with laser surgery    Seasonal allergies    TB skin/subcutaneous    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   Past Surgical History:  Procedure Laterality Date   DIAGNOSTIC LAPAROSCOPY     MASTECTOMY W/ SENTINEL NODE BIOPSY Bilateral 09/16/2018   Procedure: RIGHT MASTECTOMY WITH RIGHT AXILLARY SENTINEL LYMPH NODE BIOPSY AND LEFT RISK REDUCING MASTECTOMY;  Surgeon: Emelia Loron, MD;  Location: [facility];  Service: General;  Laterality: Bilateral;   Patient Active Problem List   Diagnosis Date Noted   Family history of cardiovascular disorder 09/12/2022   Breast cancer, right (HCC) 09/16/2018   Genetic testing 09/09/2018   Malignant neoplasm of upper-inner quadrant of right breast in female, estrogen receptor positive (HCC) 08/19/2018   Family history of breast cancer    History of penicillin allergy 09/24/2012   Exposure to strep throat 09/24/2012   Perioral dermatitis 09/24/2012   Left otitis media with effusion 08/16/2011   Hx of antibiotic allergy 05/03/2011   Disorder of teeth and supporting structures 03/30/2008   Allergic rhinitis 02/17/2008   Endometriosis 02/17/2008    Headache 02/17/2008   Hx gestational diabetes 02/17/2008   POSITIVE PPD 12/17/2007    PCP: Madelin Headings, MD   REFERRING PROVIDER: Kristian Covey, MD  REFERRING DIAG: M54.9 (ICD-10-CM) - Upper back pain   THERAPY DIAG:  Pain in thoracic spine  Abnormal posture  Muscle weakness (generalized)  Rationale for Evaluation and Treatment: Rehabilitation  ONSET DATE: chronic  SUBJECTIVE:  SUBJECTIVE STATEMENT: Patient returns to OPPT due to ongoing thoracic and flank discomfort.  Symptoms much less intense and localized since last PT episode.  Hand dominance: Right  PERTINENT HISTORY:  #2 upper back pain.  Musculoskeletal.  Has benefited from PT in the past and requesting repeat referral.  Referral is placed.   PAIN:  Are you having pain? Yes: NPRS scale: 3/10 Pain location: B scapular regions/flank Pain description: dull/achy Aggravating factors: bending, twisting Relieving factors: position changes  PRECAUTIONS: None  RED FLAGS: None     WEIGHT BEARING RESTRICTIONS: No  FALLS:  Has patient fallen in last 6 months? No  OCCUPATION: office admin  PLOF: Independent  PATIENT GOALS: To manage my back pain  NEXT MD VISIT: TBD  OBJECTIVE:  Note: Objective measures were completed at Evaluation unless otherwise noted.  DIAGNOSTIC FINDINGS:  none  PATIENT SURVEYS:   ODI: 4/50  POSTURE: rounded shoulders, forward head, and increased thoracic kyphosis  PALPATION: TTP B rhomboids R>L  CERVICAL ROM:   Active ROM A/PROM (deg) eval  Flexion 90%  Extension 90%  Right lateral flexion 75%  Left lateral flexion 50%  Right rotation 75%  Left rotation 90%   (Blank rows = not tested)  UPPER EXTREMITY ROM: WNL B  Active ROM Right eval Left eval  Shoulder  flexion    Shoulder extension    Shoulder abduction    Shoulder adduction    Shoulder extension    Shoulder internal rotation    Shoulder external rotation    Elbow flexion    Elbow extension    Wrist flexion    Wrist extension    Wrist ulnar deviation    Wrist radial deviation    Wrist pronation    Wrist supination     (Blank rows = not tested)  UPPER EXTREMITY MMT:  MMT Right eval Left eval  Shoulder flexion    Shoulder extension    Shoulder abduction    Shoulder adduction    Shoulder extension    Shoulder internal rotation    Shoulder external rotation    Middle trapezius 4 4  Lower trapezius 4 4  Elbow flexion    Elbow extension    Wrist flexion    Wrist extension    Wrist ulnar deviation    Wrist radial deviation    Wrist pronation    Wrist supination    Grip strength     (Blank rows = not tested)  CERVICAL SPECIAL TESTS:  Neck flexor muscle endurance test: Negative and Spurling's test: Negative  FUNCTIONAL TESTS:  30 seconds chair stand test  TREATMENT DATE:  [facility] Adult PT Treatment:                                                DATE: 07/09/22 Eval and HEP Self Care: Additional minutes spent for educating on updated Therapeutic Home Exercise Program as well as comparing current status to condition at start of symptoms. This included exercises focusing on stretching, strengthening, with focus on eccentric aspects. Long term goals include an improvement in range of motion, strength, endurance as well as avoiding reinjury. Patient's frequency would include in 1-2 times a day, 3-5 times a week for a duration of 6-12 weeks. Proper technique shown and discussed handout in great detail. All questions were discussed and addressed.  PATIENT EDUCATION:  Education details: Discussed eval findings, rehab rationale and POC and patient is in  agreement  Person educated: Patient Education method: Explanation Education comprehension: verbalized understanding and needs further education  HOME EXERCISE PROGRAM: Access Code: [email or web address] URL: [email or web address] Date: 07/09/2023 Prepared by: Gustavus Bryant  Exercises - Doorway Pec Stretch at 90 Degrees Abduction  - 2 x daily - 5 x weekly - 1 sets - 3 reps - 30s hold - Supine Shoulder Horizontal Abduction with Resistance  - 1 x daily - 5 x weekly - 2 sets - 15 reps - Shoulder extension with resistance - Neutral  - 1 x daily - 5 x weekly - 2 sets - 15 reps  ASSESSMENT:  CLINICAL IMPRESSION: Patient is a 58 y.o. female who was seen today for physical therapy evaluation and treatment for chronic thoracic and flank pain. Patient presents with good shoulder mobility B.  ROM restrictions noted in cervical spine and weakness identified in posterior shoulders including rhomboids and middle traps.  Palpation finds TrPs to B rhomboids R>L.  Postural deficits of elevated and forward shoulder identified.  OBJECTIVE IMPAIRMENTS: decreased activity tolerance, decreased knowledge of condition, decreased strength, increased fascial restrictions, impaired perceived functional ability, postural dysfunction, and pain.   ACTIVITY LIMITATIONS: carrying, lifting, bending, and sitting  PERSONAL FACTORS: Age, Behavior pattern, Fitness, and Past/current experiences are also affecting patient's functional outcome.   REHAB POTENTIAL: Good  CLINICAL DECISION MAKING: Stable/uncomplicated  EVALUATION COMPLEXITY: Low   GOALS: Goals reviewed with patient? No  SHORT TERM GOALS: Target date: 07/30/2023  Patient to demonstrate independence in HEP  Baseline:  Goal status: INITIAL   LONG TERM GOALS: Target date: 08/20/2023  Patient will score at least 2/50 on NDI to signify clinically meaningful improvement in functional abilities.   Baseline: 4/50 Goal status: INITIAL  2.  Patient will  acknowledge 2/10 pain at least once during episode of care   Baseline: 3/10 Goal status: INITIAL  3.  Increase cervical AROM to 90% Baseline:  Active ROM A/PROM (deg) eval  Flexion 90%  Extension 90%  Right lateral flexion 75%  Left lateral flexion 50%  Right rotation 75%  Left rotation 90%   Goal status: INITIAL  4.  Increase strength to 4+/5 in deficit regions Baseline:  MMT Right eval Left eval  Shoulder flexion    Shoulder extension    Shoulder abduction    Shoulder adduction    Shoulder extension    Shoulder internal rotation    Shoulder external rotation    Middle trapezius 4 4  Lower trapezius 4 4   Goal status: INITIAL     PLAN:  PT FREQUENCY: 2x/week  PT DURATION: 6 weeks  PLANNED INTERVENTIONS: 97164- PT Re-evaluation, 97110-Therapeutic exercises, 97530- Therapeutic activity, 97112- Neuromuscular re-education, 97535- Self Care, 54098- Manual therapy, Patient/Family education, Dry Needling, Joint mobilization, and Spinal mobilization  PLAN FOR NEXT SESSION: HEP review and update, manual techniques as appropriate, aerobic tasks, ROM and flexibility activities, strengthening and PREs, TPDN, gait and balance training as needed     Hildred Laser, PT 07/09/2023, 9:27 AM

## 2023-07-11 ENCOUNTER — Other Ambulatory Visit (HOSPITAL_COMMUNITY): Payer: Self-pay | Admitting: Family Medicine

## 2023-07-11 DIAGNOSIS — R9431 Abnormal electrocardiogram [ECG] [EKG]: Secondary | ICD-10-CM

## 2023-07-13 ENCOUNTER — Ambulatory Visit: Attending: Family Medicine

## 2023-07-13 DIAGNOSIS — M6281 Muscle weakness (generalized): Secondary | ICD-10-CM | POA: Insufficient documentation

## 2023-07-13 DIAGNOSIS — M546 Pain in thoracic spine: Secondary | ICD-10-CM | POA: Insufficient documentation

## 2023-07-13 DIAGNOSIS — R293 Abnormal posture: Secondary | ICD-10-CM | POA: Diagnosis present

## 2023-07-16 ENCOUNTER — Ambulatory Visit

## 2023-07-16 DIAGNOSIS — R293 Abnormal posture: Secondary | ICD-10-CM

## 2023-07-16 DIAGNOSIS — M6281 Muscle weakness (generalized): Secondary | ICD-10-CM

## 2023-07-16 DIAGNOSIS — M546 Pain in thoracic spine: Secondary | ICD-10-CM

## 2023-07-16 NOTE — Therapy (Signed)
 OUTPATIENT PHYSICAL THERAPY TREATMENT NOTE   Patient Name: Megan Armstrong MRN: [id number] DOB:Mar 14, 1966, 58 y.o., female Today's Date: 07/16/2023  END OF SESSION:  PT End of Session - 07/16/23 0757     Visit Number 3    Number of Visits 12    Date for PT Re-Evaluation 09/08/23    Authorization Type cign[employer]    PT Start Time [phone]   late for session   PT Stop Time 0828    PT Time Calculation (min) 30 min    Activity Tolerance Patient tolerated treatment well    Behavior During Therapy [facility] for tasks assessed/performed               Past Medical History:  Diagnosis Date   Allergic rhinitis    Endometrial hyperplasia    Endometriosis    Family history of breast cancer    Family history of breast cancer    Hx of endometriosis    rectal vag with laser surgery    Seasonal allergies    TB skin/subcutaneous    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   Past Surgical History:  Procedure Laterality Date   DIAGNOSTIC LAPAROSCOPY     MASTECTOMY W/ SENTINEL NODE BIOPSY Bilateral 09/16/2018   Procedure: RIGHT MASTECTOMY WITH RIGHT AXILLARY SENTINEL LYMPH NODE BIOPSY AND LEFT RISK REDUCING MASTECTOMY;  Surgeon: Emelia Loron, MD;  Location: [facility];  Service: General;  Laterality: Bilateral;   Patient Active Problem List   Diagnosis Date Noted   Family history of cardiovascular disorder 09/12/2022   Breast cancer, right (HCC) 09/16/2018   Genetic testing 09/09/2018   Malignant neoplasm of upper-inner quadrant of right breast in female, estrogen receptor positive (HCC) 08/19/2018   Family history of breast cancer    History of penicillin allergy 09/24/2012   Exposure to strep throat 09/24/2012   Perioral dermatitis 09/24/2012   Left otitis media with effusion 08/16/2011   Hx of antibiotic allergy 05/03/2011   Disorder of teeth and supporting structures 03/30/2008   Allergic rhinitis 02/17/2008   Endometriosis 02/17/2008   Headache  02/17/2008   Hx gestational diabetes 02/17/2008   POSITIVE PPD 12/17/2007    PCP: Madelin Headings, MD   REFERRING PROVIDER: Kristian Covey, MD  REFERRING DIAG: M54.9 (ICD-10-CM) - Upper back pain   THERAPY DIAG:  Pain in thoracic spine  Abnormal posture  Muscle weakness (generalized)  Rationale for Evaluation and Treatment: Rehabilitation  ONSET DATE: chronic  SUBJECTIVE:  SUBJECTIVE STATEMENT: Symptoms only present on R side today  Hand dominance: Right  PERTINENT HISTORY:  #2 upper back pain.  Musculoskeletal.  Has benefited from PT in the past and requesting repeat referral.  Referral is placed.   PAIN:  Are you having pain? Yes: NPRS scale: 3/10 Pain location: B scapular regions/flank Pain description: dull/achy Aggravating factors: bending, twisting Relieving factors: position changes  PRECAUTIONS: None  RED FLAGS: None     WEIGHT BEARING RESTRICTIONS: No  FALLS:  Has patient fallen in last 6 months? No  OCCUPATION: office admin  PLOF: Independent  PATIENT GOALS: To manage my back pain  NEXT MD VISIT: TBD  OBJECTIVE:  Note: Objective measures were completed at Evaluation unless otherwise noted.  DIAGNOSTIC FINDINGS:  none  PATIENT SURVEYS:   ODI: 4/50  POSTURE: rounded shoulders, forward head, and increased thoracic kyphosis  PALPATION: TTP B rhomboids R>L  CERVICAL ROM:   Active ROM A/PROM (deg) eval  Flexion 90%  Extension 90%  Right lateral flexion 75%  Left lateral flexion 50%  Right rotation 75%  Left rotation 90%   (Blank rows = not tested)  UPPER EXTREMITY ROM: WNL B  Active ROM Right eval Left eval  Shoulder flexion    Shoulder extension    Shoulder abduction    Shoulder adduction    Shoulder extension     Shoulder internal rotation    Shoulder external rotation    Elbow flexion    Elbow extension    Wrist flexion    Wrist extension    Wrist ulnar deviation    Wrist radial deviation    Wrist pronation    Wrist supination     (Blank rows = not tested)  UPPER EXTREMITY MMT:  MMT Right eval Left eval  Shoulder flexion    Shoulder extension    Shoulder abduction    Shoulder adduction    Shoulder extension    Shoulder internal rotation    Shoulder external rotation    Middle trapezius 4 4  Lower trapezius 4 4  Elbow flexion    Elbow extension    Wrist flexion    Wrist extension    Wrist ulnar deviation    Wrist radial deviation    Wrist pronation    Wrist supination    Grip strength     (Blank rows = not tested)  CERVICAL SPECIAL TESTS:  Neck flexor muscle endurance test: Negative and Spurling's test: Negative  FUNCTIONAL TESTS:  30 seconds chair stand test n/a  TREATMENT DATE:  [facility] Adult PT Treatment:                                                DATE: 07/16/23  Manual Therapy: Skilled palpation to identify taught and irritable bands in R infraspinatus and Teres major  Trigger Point Dry Needling  Subsequent Treatment: Instructions provided previously at initial dry needling treatment.   Patient Verbal Consent Given: Yes Education Handout Provided: Previously Provided Muscles Treated: R infraspinatus and Teres major  Electrical Stimulation Performed: No Treatment Response/Outcome: twitch response f/b decreased muscle tension and irritability    Therapeutic Activity: Omega high row 17.5# 15x Omega low row 17.5# 15x Omega lat pulldown 17.5# 15x UBE L1.5 3/3 min Seated t-spine extension 10x with light PT OP  [facility] Adult PT Treatment:  DATE: 07/13/23 Therapeutic Exercise: UBE L1 3/3 min Manual Therapy: Skilled palpation to identify taught and irritable bands in B infraspinatus Trigger Point Dry  Needling  Initial Treatment: Pt instructed on Dry Needling rational, procedures, and possible side effects. Pt instructed to expect mild to moderate muscle soreness later in the day and/or into the next day.  Pt instructed in methods to reduce muscle soreness. Pt instructed to continue prescribed HEP. Because Dry Needling was performed over or adjacent to a lung field, pt was educated on S/S of pneumothorax and to seek immediate medical attention should they occur.  Patient was educated on signs and symptoms of infection and other risk factors and advised to seek medical attention should they occur.  Patient verbalized understanding of these instructions and education.   Patient Verbal Consent Given: Yes Education Handout Provided: Previously Provided Muscles Treated: B infraspinatus Electrical Stimulation Performed: No Treatment Response/Outcome: decreased pain and soft tissue tension     Therapeutic Activity: Omega high row 15# 15x Omega low row 15# 15x Omega lat pulldown 15# 15x Seated t-spine extension 10x with light PT OP  [facility] Adult PT Treatment:                                                DATE: 07/09/22 Eval and HEP Self Care: Additional minutes spent for educating on updated Therapeutic Home Exercise Program as well as comparing current status to condition at start of symptoms. This included exercises focusing on stretching, strengthening, with focus on eccentric aspects. Long term goals include an improvement in range of motion, strength, endurance as well as avoiding reinjury. Patient's frequency would include in 1-2 times a day, 3-5 times a week for a duration of 6-12 weeks. Proper technique shown and discussed handout in great detail. All questions were discussed and addressed.                                                                                                                                 PATIENT EDUCATION:  Education details: Discussed eval findings,  rehab rationale and POC and patient is in agreement  Person educated: Patient Education method: Explanation Education comprehension: verbalized understanding and needs further education  HOME EXERCISE PROGRAM: Access Code: [email or web address] URL: [email or web address] Date: 07/09/2023 Prepared by: Gustavus Bryant  Exercises - Doorway Pec Stretch at 90 Degrees Abduction  - 2 x daily - 5 x weekly - 1 sets - 3 reps - 30s hold - Supine Shoulder Horizontal Abduction with Resistance  - 1 x daily - 5 x weekly - 2 sets - 15 reps - Shoulder extension with resistance - Neutral  - 1 x daily - 5 x weekly - 2 sets - 15 reps  ASSESSMENT:  CLINICAL IMPRESSION: Patient continues to report decreasing symptoms and discomfort following PT sessions.  Good response to DN f/b posture retraining exercises and isotonic strengthening emphasizing full ROM through exercise progression.  Patient is a 58 y.o. female who was seen today for physical therapy evaluation and treatment for chronic thoracic and flank pain. Patient presents with good shoulder mobility B.  ROM restrictions noted in cervical spine and weakness identified in posterior shoulders including rhomboids and middle traps.  Palpation finds TrPs to B rhomboids R>L.  Postural deficits of elevated and forward shoulder identified.  OBJECTIVE IMPAIRMENTS: decreased activity tolerance, decreased knowledge of condition, decreased strength, increased fascial restrictions, impaired perceived functional ability, postural dysfunction, and pain.   ACTIVITY LIMITATIONS: carrying, lifting, bending, and sitting  PERSONAL FACTORS: Age, Behavior pattern, Fitness, and Past/current experiences are also affecting patient's functional outcome.   REHAB POTENTIAL: Good  CLINICAL DECISION MAKING: Stable/uncomplicated  EVALUATION COMPLEXITY: Low   GOALS: Goals reviewed with patient? No  SHORT TERM GOALS: Target date: 07/30/2023  Patient to demonstrate  independence in HEP  Baseline:  [id number] Goal status: INITIAL   LONG TERM GOALS: Target date: 08/20/2023  Patient will score at least 2/50 on NDI to signify clinically meaningful improvement in functional abilities.   Baseline: 4/50 Goal status: INITIAL  2.  Patient will acknowledge 2/10 pain at least once during episode of care   Baseline: 3/10 Goal status: INITIAL  3.  Increase cervical AROM to 90% Baseline:  Active ROM A/PROM (deg) eval  Flexion 90%  Extension 90%  Right lateral flexion 75%  Left lateral flexion 50%  Right rotation 75%  Left rotation 90%   Goal status: INITIAL  4.  Increase strength to 4+/5 in deficit regions Baseline:  MMT Right eval Left eval  Shoulder flexion    Shoulder extension    Shoulder abduction    Shoulder adduction    Shoulder extension    Shoulder internal rotation    Shoulder external rotation    Middle trapezius 4 4  Lower trapezius 4 4   Goal status: INITIAL     PLAN:  PT FREQUENCY: 2x/week  PT DURATION: 6 weeks  PLANNED INTERVENTIONS: 97164- PT Re-evaluation, 97110-Therapeutic exercises, 97530- Therapeutic activity, 97112- Neuromuscular re-education, 97535- Self Care, 54098- Manual therapy, Patient/Family education, Dry Needling, Joint mobilization, and Spinal mobilization  PLAN FOR NEXT SESSION: HEP review and update, manual techniques as appropriate, aerobic tasks, ROM and flexibility activities, strengthening and PREs, TPDN, gait and balance training as needed     Hildred Laser, PT 07/16/2023, 8:26 AM

## 2023-07-16 NOTE — Progress Notes (Signed)
 Full panel of blood work  shows  elevated ldl   and d dimer  will review at upcoming visit

## 2023-07-18 ENCOUNTER — Ambulatory Visit: Admitting: Internal Medicine

## 2023-07-18 NOTE — Therapy (Unsigned)
 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   Past Surgical History:  Procedure Laterality Date   DIAGNOSTIC LAPAROSCOPY     MASTECTOMY W/ SENTINEL NODE BIOPSY Bilateral 09/16/2018   Procedure: RIGHT MASTECTOMY WITH RIGHT AXILLARY SENTINEL LYMPH NODE BIOPSY AND LEFT RISK REDUCING MASTECTOMY;  Surgeon: Emelia Loron, MD;  Location: [facility];  Service: General;  Laterality: Bilateral;   Patient Active Problem List   Diagnosis Date Noted   Family history of cardiovascular disorder 09/12/2022   Breast cancer, right (HCC) 09/16/2018   Genetic testing 09/09/2018   Malignant neoplasm of upper-inner quadrant of right breast in female, estrogen receptor positive (HCC) 08/19/2018   Family history of breast cancer    History of penicillin allergy 09/24/2012   Exposure to strep throat 09/24/2012   Perioral dermatitis 09/24/2012   Left otitis media with effusion 08/16/2011   Hx of antibiotic allergy 05/03/2011   Disorder of teeth and supporting structures 03/30/2008   Allergic rhinitis 02/17/2008   Endometriosis 02/17/2008   Headache 02/17/2008   Hx gestational diabetes 02/17/2008   POSITIVE PPD 12/17/2007    PCP: Madelin Headings, MD   REFERRING PROVIDER: Kristian Covey, MD  REFERRING DIAG: M54.9 (ICD-10-CM) - Upper back pain   THERAPY DIAG:  No diagnosis found.  Rationale for Evaluation and Treatment: Rehabilitation  ONSET DATE: chronic  SUBJECTIVE:                                                                                                                                                                                                          SUBJECTIVE STATEMENT: Symptoms only present on R side today  Hand dominance: Right  PERTINENT HISTORY:  #2 upper back pain.  Musculoskeletal.  Has benefited from PT in the past and requesting repeat referral.  Referral is placed.   PAIN:  Are you having pain? Yes: NPRS scale: 3/10 Pain location: B scapular regions/flank Pain description: dull/achy Aggravating factors: bending, twisting Relieving factors: position changes  PRECAUTIONS:  None  RED FLAGS: None     WEIGHT BEARING RESTRICTIONS: No  FALLS:  Has patient fallen in last 6 months? No  OCCUPATION: office admin  PLOF: Independent  PATIENT GOALS: To manage my back pain  NEXT MD VISIT: TBD  OBJECTIVE:  Note: Objective measures were completed at Evaluation unless otherwise noted.  DIAGNOSTIC FINDINGS:  none  PATIENT SURVEYS:   ODI: 4/50  POSTURE: rounded shoulders, forward head, and increased thoracic kyphosis  PALPATION: TTP B rhomboids R>L  CERVICAL ROM:   Active ROM A/PROM (deg) eval  Flexion 90%  Extension 90%  Right lateral flexion 75%  Left lateral flexion 50%  Right rotation 75%  Left rotation 90%   (Blank rows = not tested)  UPPER EXTREMITY ROM: WNL B  Active ROM Right eval Left eval  Shoulder flexion    Shoulder extension    Shoulder abduction    Shoulder adduction    Shoulder extension    Shoulder internal rotation    Shoulder external rotation    Elbow flexion    Elbow extension    Wrist flexion    Wrist extension    Wrist ulnar deviation    Wrist radial deviation    Wrist pronation    Wrist supination     (Blank rows = not tested)  UPPER EXTREMITY MMT:  MMT Right eval Left eval  Shoulder flexion    Shoulder extension    Shoulder abduction    Shoulder adduction    Shoulder extension    Shoulder  internal rotation    Shoulder external rotation    Middle trapezius 4 4  Lower trapezius 4 4  Elbow flexion    Elbow extension    Wrist flexion    Wrist extension    Wrist ulnar deviation    Wrist radial deviation    Wrist pronation    Wrist supination    Grip strength     (Blank rows = not tested)  CERVICAL SPECIAL TESTS:  Neck flexor muscle endurance test: Negative and Spurling's test: Negative  FUNCTIONAL TESTS:  30 seconds chair stand test n/a  TREATMENT DATE:  [facility] Adult PT Treatment:                                                DATE: 07/16/23  Manual Therapy: Skilled palpation to identify taught and irritable bands in R infraspinatus and Teres major  Trigger Point Dry Needling  Subsequent Treatment: Instructions provided previously at initial dry needling treatment.   Patient Verbal Consent Given: Yes Education Handout Provided: Previously Provided Muscles Treated: R infraspinatus and Teres major  Electrical Stimulation Performed: No Treatment Response/Outcome: twitch response f/b decreased muscle tension and irritability    Therapeutic Activity: Omega high row 17.5# 15x Omega low row 17.5# 15x Omega lat pulldown 17.5# 15x UBE L1.5 3/3 min Seated t-spine extension 10x with light PT OP  [facility] Adult PT Treatment:                                                DATE: 07/13/23 Therapeutic Exercise: UBE L1 3/3 min Manual Therapy: Skilled palpation to identify taught and irritable bands in B infraspinatus Trigger Point Dry Needling  Initial Treatment: Pt instructed  on Dry Needling rational, procedures, and possible side effects. Pt instructed to expect mild to moderate muscle soreness later in the day and/or into the next day.  Pt instructed in methods to reduce muscle soreness. Pt instructed to continue prescribed HEP. Because Dry Needling was performed over or adjacent to a lung field, pt was educated on S/S of pneumothorax and to seek immediate medical attention  should they occur.  Patient was educated on signs and symptoms of infection and other risk factors and advised to seek medical attention should they occur.  Patient verbalized understanding of these instructions and education.   Patient Verbal Consent Given: Yes Education Handout Provided: Previously Provided Muscles Treated: B infraspinatus Electrical Stimulation Performed: No Treatment Response/Outcome: decreased pain and soft tissue tension     Therapeutic Activity: Omega high row 15# 15x Omega low row 15# 15x Omega lat pulldown 15# 15x Seated t-spine extension 10x with light PT OP  [facility] Adult PT Treatment:                                                DATE: 07/09/22 Eval and HEP Self Care: Additional minutes spent for educating on updated Therapeutic Home Exercise Program as well as comparing current status to condition at start of symptoms. This included exercises focusing on stretching, strengthening, with focus on eccentric aspects. Long term goals include an improvement in range of motion, strength, endurance as well as avoiding reinjury. Patient's frequency would include in 1-2 times a day, 3-5 times a week for a duration of 6-12 weeks. Proper technique shown and discussed handout in great detail. All questions were discussed and addressed.                                                                                                                                 PATIENT EDUCATION:  Education details: Discussed eval findings, rehab rationale and POC and patient is in agreement  Person educated: Patient Education method: Explanation Education comprehension: verbalized understanding and needs further education  HOME EXERCISE PROGRAM: Access Code: [email or web address] URL: [email or web address] Date: 07/09/2023 Prepared by: Gustavus Bryant  Exercises - Doorway Pec Stretch at 90 Degrees Abduction  - 2 x daily - 5 x weekly - 1 sets - 3 reps - 30s hold - Supine Shoulder  Horizontal Abduction with Resistance  - 1 x daily - 5 x weekly - 2 sets - 15 reps - Shoulder extension with resistance - Neutral  - 1 x daily - 5 x weekly - 2 sets - 15 reps  ASSESSMENT:  CLINICAL IMPRESSION: Patient continues to report decreasing symptoms and discomfort following PT sessions.  Good response to DN f/b posture retraining exercises and isotonic strengthening emphasizing full ROM through exercise progression.  Patient is a 58 y.o. female who was seen today for physical  therapy evaluation and treatment for chronic thoracic and flank pain. Patient presents with good shoulder mobility B.  ROM restrictions noted in cervical spine and weakness identified in posterior shoulders including rhomboids and middle traps.  Palpation finds TrPs to B rhomboids R>L.  Postural deficits of elevated and forward shoulder identified.  OBJECTIVE IMPAIRMENTS: decreased activity tolerance, decreased knowledge of condition, decreased strength, increased fascial restrictions, impaired perceived functional ability, postural dysfunction, and pain.   ACTIVITY LIMITATIONS: carrying, lifting, bending, and sitting  PERSONAL FACTORS: Age, Behavior pattern, Fitness, and Past/current experiences are also affecting patient's functional outcome.   REHAB POTENTIAL: Good  CLINICAL DECISION MAKING: Stable/uncomplicated  EVALUATION COMPLEXITY: Low   GOALS: Goals reviewed with patient? No  SHORT TERM GOALS: Target date: 07/30/2023  Patient to demonstrate independence in HEP  Baseline:  [id number] Goal status: INITIAL   LONG TERM GOALS: Target date: 08/20/2023  Patient will score at least 2/50 on NDI to signify clinically meaningful improvement in functional abilities.   Baseline: 4/50 Goal status: INITIAL  2.  Patient will acknowledge 2/10 pain at least once during episode of care   Baseline: 3/10 Goal status: INITIAL  3.  Increase cervical AROM to 90% Baseline:  Active ROM A/PROM (deg) eval  Flexion  90%  Extension 90%  Right lateral flexion 75%  Left lateral flexion 50%  Right rotation 75%  Left rotation 90%   Goal status: INITIAL  4.  Increase strength to 4+/5 in deficit regions Baseline:  MMT Right eval Left eval  Shoulder flexion    Shoulder extension    Shoulder abduction    Shoulder adduction    Shoulder extension    Shoulder internal rotation    Shoulder external rotation    Middle trapezius 4 4  Lower trapezius 4 4   Goal status: INITIAL     PLAN:  PT FREQUENCY: 2x/week  PT DURATION: 6 weeks  PLANNED INTERVENTIONS: 97164- PT Re-evaluation, 97110-Therapeutic exercises, 97530- Therapeutic activity, 97112- Neuromuscular re-education, 97535- Self Care, 54098- Manual therapy, Patient/Family education, Dry Needling, Joint mobilization, and Spinal mobilization  PLAN FOR NEXT SESSION: HEP review and update, manual techniques as appropriate, aerobic tasks, ROM and flexibility activities, strengthening and PREs, TPDN, gait and balance training as needed     Hildred Laser, PT 07/18/2023, 2:13 PM

## 2023-07-20 ENCOUNTER — Ambulatory Visit

## 2023-07-20 DIAGNOSIS — M6281 Muscle weakness (generalized): Secondary | ICD-10-CM

## 2023-07-20 DIAGNOSIS — R293 Abnormal posture: Secondary | ICD-10-CM

## 2023-07-20 DIAGNOSIS — M546 Pain in thoracic spine: Secondary | ICD-10-CM | POA: Diagnosis not present

## 2023-07-22 NOTE — Therapy (Unsigned)
 OUTPATIENT PHYSICAL THERAPY TREATMENT NOTE   Patient Name: Jalisha Enneking MRN: [id number] DOB:05-07-1965, 58 y.o., female Today's Date: 07/23/2023  END OF SESSION:  PT End of Session - 07/23/23 1619     Visit Number 5    Number of Visits 12    Date for PT Re-Evaluation 09/08/23    Authorization Type cigna    PT Start Time 1615    PT Stop Time 1655    PT Time Calculation (min) 40 min    Activity Tolerance Patient tolerated treatment well    Behavior During Therapy WFL for tasks assessed/performed                 Past Medical History:  Diagnosis Date   Allergic rhinitis    Endometrial hyperplasia    Endometriosis    Family history of breast cancer    Family history of breast cancer    Hx of endometriosis    rectal vag with laser surgery    Seasonal allergies    TB skin/subcutaneous    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   Past Surgical History:  Procedure Laterality Date   DIAGNOSTIC LAPAROSCOPY     MASTECTOMY W/ SENTINEL NODE BIOPSY Bilateral 09/16/2018   Procedure: RIGHT MASTECTOMY WITH RIGHT AXILLARY SENTINEL LYMPH NODE BIOPSY AND LEFT RISK REDUCING MASTECTOMY;  Surgeon: Enid Harry, MD;  Location: [facility];  Service: General;  Laterality: Bilateral;   Patient Active Problem List   Diagnosis Date Noted   Family history of cardiovascular disorder 09/12/2022   Breast cancer, right (HCC) 09/16/2018   Genetic testing 09/09/2018   Malignant neoplasm of upper-inner quadrant of right breast in female, estrogen receptor positive (HCC) 08/19/2018   Family history of breast cancer    History of penicillin allergy 09/24/2012   Exposure to strep throat 09/24/2012   Perioral dermatitis 09/24/2012   Left otitis media with effusion 08/16/2011   Hx of antibiotic allergy 05/03/2011   Disorder of teeth and supporting structures 03/30/2008   Allergic rhinitis 02/17/2008   Endometriosis 02/17/2008   Headache 02/17/2008    Hx gestational diabetes 02/17/2008   POSITIVE PPD 12/17/2007    PCP: Reginal Capra, MD   REFERRING PROVIDER: Marquetta Sit, MD  REFERRING DIAG: M54.9 (ICD-10-CM) - Upper back pain   THERAPY DIAG:  Pain in thoracic spine  Muscle weakness (generalized)  Abnormal posture  Rationale for Evaluation and Treatment: Rehabilitation  ONSET DATE: chronic  SUBJECTIVE:  SUBJECTIVE STATEMENT: Symptoms continue to decrease in intensity.  Had a busy weekend w/o a marked exacerbation of symptoms.   Hand dominance: Right  PERTINENT HISTORY:  #2 upper back pain.  Musculoskeletal.  Has benefited from PT in the past and requesting repeat referral.  Referral is placed.   PAIN:  Are you having pain? Yes: NPRS scale: 3/10 Pain location: B scapular regions/flank Pain description: dull/achy Aggravating factors: bending, twisting Relieving factors: position changes  PRECAUTIONS: None  RED FLAGS: None     WEIGHT BEARING RESTRICTIONS: No  FALLS:  Has patient fallen in last 6 months? No  OCCUPATION: office admin  PLOF: Independent  PATIENT GOALS: To manage my back pain  NEXT MD VISIT: TBD  OBJECTIVE:  Note: Objective measures were completed at Evaluation unless otherwise noted.  DIAGNOSTIC FINDINGS:  none  PATIENT SURVEYS:   ODI: 4/50  POSTURE: rounded shoulders, forward head, and increased thoracic kyphosis  PALPATION: TTP B rhomboids R>L  CERVICAL ROM:   Active ROM A/PROM (deg) eval  Flexion 90%  Extension 90%  Right lateral flexion 75%  Left lateral flexion 50%  Right rotation 75%  Left rotation 90%   (Blank rows = not tested)  UPPER EXTREMITY ROM: WNL B  Active ROM Right eval Left eval  Shoulder flexion    Shoulder extension    Shoulder abduction     Shoulder adduction    Shoulder extension    Shoulder internal rotation    Shoulder external rotation    Elbow flexion    Elbow extension    Wrist flexion    Wrist extension    Wrist ulnar deviation    Wrist radial deviation    Wrist pronation    Wrist supination     (Blank rows = not tested)  UPPER EXTREMITY MMT:  MMT Right eval Left eval  Shoulder flexion    Shoulder extension    Shoulder abduction    Shoulder adduction    Shoulder extension    Shoulder internal rotation    Shoulder external rotation    Middle trapezius 4 4  Lower trapezius 4 4  Elbow flexion    Elbow extension    Wrist flexion    Wrist extension    Wrist ulnar deviation    Wrist radial deviation    Wrist pronation    Wrist supination    Grip strength     (Blank rows = not tested)  CERVICAL SPECIAL TESTS:  Neck flexor muscle endurance test: Negative and Spurling's test: Negative  FUNCTIONAL TESTS:  30 seconds chair stand test n/a  TREATMENT DATE:  [facility] Adult PT Treatment:                                                DATE: 07/23/23 Manual Therapy: Skilled palpation to identify taught and irritable bands in B teres major Trigger Point Dry Needling  Subsequent Treatment: Instructions provided previously at initial dry needling treatment.   Patient Verbal Consent Given: Yes Education Handout Provided: Previously Provided Muscles Treated: B teres major Electrical Stimulation Performed: No Treatment Response/Outcome: twitch responses B  Therapeutic Activity: Omega high row 17.5# 15x2 Omega low row 17.5# 15x2 Omega lat pulldown 17.5# 15x2 UBE L3 3/3 min Supine scapular retraction(hands b/d head) 10x  [facility] Adult PT Treatment:  DATE: 07/20/23 Manual Therapy: Skilled palpation to identify taught and irritable bands in L infraspinatus R teres major Trigger Point Dry Needling  Subsequent Treatment: Instructions provided previously at initial  dry needling treatment.   Patient Verbal Consent Given: Yes Education Handout Provided: Previously Provided Muscles Treated: L infraspinatus R teres major Electrical Stimulation Performed: No Treatment Response/Outcome: twitch response followed by decreased discomfort and soft tissue irritation    Therapeutic Activity: Omega high row 15# 15x2 Omega low row 15# 15x2 Omega lat pulldown 15# 15x2 UBE L2 3/3 min Supine scapular retraction(hands b/d head) 10x  [facility] Adult PT Treatment:                                                DATE: 07/16/23  Manual Therapy: Skilled palpation to identify taught and irritable bands in R infraspinatus and Teres major  Trigger Point Dry Needling  Subsequent Treatment: Instructions provided previously at initial dry needling treatment.   Patient Verbal Consent Given: Yes Education Handout Provided: Previously Provided Muscles Treated: R infraspinatus and Teres major  Electrical Stimulation Performed: No Treatment Response/Outcome: twitch response f/b decreased muscle tension and irritability    Therapeutic Activity: Omega high row 17.5# 15x Omega low row 17.5# 15x Omega lat pulldown 17.5# 15x UBE L1.5 3/3 min Seated t-spine extension 10x with light PT OP  [facility] Adult PT Treatment:                                                DATE: 07/13/23 Therapeutic Exercise: UBE L1 3/3 min Manual Therapy: Skilled palpation to identify taught and irritable bands in B infraspinatus Trigger Point Dry Needling  Initial Treatment: Pt instructed on Dry Needling rational, procedures, and possible side effects. Pt instructed to expect mild to moderate muscle soreness later in the day and/or into the next day.  Pt instructed in methods to reduce muscle soreness. Pt instructed to continue prescribed HEP. Because Dry Needling was performed over or adjacent to a lung field, pt was educated on S/S of pneumothorax and to seek immediate medical attention should they occur.   Patient was educated on signs and symptoms of infection and other risk factors and advised to seek medical attention should they occur.  Patient verbalized understanding of these instructions and education.   Patient Verbal Consent Given: Yes Education Handout Provided: Previously Provided Muscles Treated: B infraspinatus Electrical Stimulation Performed: No Treatment Response/Outcome: decreased pain and soft tissue tension     Therapeutic Activity: Omega high row 15# 15x Omega low row 15# 15x Omega lat pulldown 15# 15x Seated t-spine extension 10x with light PT OP  [facility] Adult PT Treatment:                                                DATE: 07/09/22 Eval and HEP Self Care: Additional minutes spent for educating on updated Therapeutic Home Exercise Program as well as comparing current status to condition at start of symptoms. This included exercises focusing on stretching, strengthening, with focus on eccentric aspects. Long term goals include an improvement in range of motion, strength, endurance  as well as avoiding reinjury. Patient's frequency would include in 1-2 times a day, 3-5 times a week for a duration of 6-12 weeks. Proper technique shown and discussed handout in great detail. All questions were discussed and addressed.                                                                                                                                 PATIENT EDUCATION:  Education details: Discussed eval findings, rehab rationale and POC and patient is in agreement  Person educated: Patient Education method: Explanation Education comprehension: verbalized understanding and needs further education  HOME EXERCISE PROGRAM: Access Code: [email or web address] URL: [email or web address] Date: 07/09/2023 Prepared by: Gretta Leavens  Exercises - Doorway Pec Stretch at 90 Degrees Abduction  - 2 x daily - 5 x weekly - 1 sets - 3 reps - 30s hold - Supine Shoulder Horizontal  Abduction with Resistance  - 1 x daily - 5 x weekly - 2 sets - 15 reps - Shoulder extension with resistance - Neutral  - 1 x daily - 5 x weekly - 2 sets - 15 reps  ASSESSMENT:  CLINICAL IMPRESSION: Increased weight and resistance in order to build strength and endurance in postural muscles as well as facilitate ROM/stretch.  Able to tolerate increased workload with only fatigue reported.   Patient is a 58 y.o. female who was seen today for physical therapy evaluation and treatment for chronic thoracic and flank pain. Patient presents with good shoulder mobility B.  ROM restrictions noted in cervical spine and weakness identified in posterior shoulders including rhomboids and middle traps.  Palpation finds TrPs to B rhomboids R>L.  Postural deficits of elevated and forward shoulder identified.  OBJECTIVE IMPAIRMENTS: decreased activity tolerance, decreased knowledge of condition, decreased strength, increased fascial restrictions, impaired perceived functional ability, postural dysfunction, and pain.   ACTIVITY LIMITATIONS: carrying, lifting, bending, and sitting  PERSONAL FACTORS: Age, Behavior pattern, Fitness, and Past/current experiences are also affecting patient's functional outcome.   REHAB POTENTIAL: Good  CLINICAL DECISION MAKING: Stable/uncomplicated  EVALUATION COMPLEXITY: Low   GOALS: Goals reviewed with patient? No  SHORT TERM GOALS: Target date: 07/30/2023  Patient to demonstrate independence in HEP  Baseline:  [id number] Goal status: INITIAL   LONG TERM GOALS: Target date: 08/20/2023  Patient will score at least 2/50 on NDI to signify clinically meaningful improvement in functional abilities.   Baseline: 4/50 Goal status: INITIAL  2.  Patient will acknowledge 2/10 pain at least once during episode of care   Baseline: 3/10 Goal status: INITIAL  3.  Increase cervical AROM to 90% Baseline:  Active ROM A/PROM (deg) eval  Flexion 90%  Extension 90%  Right lateral  flexion 75%  Left lateral flexion 50%  Right rotation 75%  Left rotation 90%   Goal status: INITIAL  4.  Increase strength to 4+/5 in deficit regions Baseline:  MMT Right eval Left eval  Shoulder flexion  Shoulder extension    Shoulder abduction    Shoulder adduction    Shoulder extension    Shoulder internal rotation    Shoulder external rotation    Middle trapezius 4 4  Lower trapezius 4 4   Goal status: INITIAL     PLAN:  PT FREQUENCY: 2x/week  PT DURATION: 6 weeks  PLANNED INTERVENTIONS: 97164- PT Re-evaluation, 97110-Therapeutic exercises, 97530- Therapeutic activity, 97112- Neuromuscular re-education, 97535- Self Care, [id number]- Manual therapy, Patient/Family education, Dry Needling, Joint mobilization, and Spinal mobilization  PLAN FOR NEXT SESSION: HEP review and update, manual techniques as appropriate, aerobic tasks, ROM and flexibility activities, strengthening and PREs, TPDN, gait and balance training as needed     Eldon Greenland, PT 07/23/2023, 4:57 PM

## 2023-07-23 ENCOUNTER — Ambulatory Visit

## 2023-07-23 DIAGNOSIS — R293 Abnormal posture: Secondary | ICD-10-CM

## 2023-07-23 DIAGNOSIS — M546 Pain in thoracic spine: Secondary | ICD-10-CM

## 2023-07-23 DIAGNOSIS — M6281 Muscle weakness (generalized): Secondary | ICD-10-CM

## 2023-07-24 NOTE — Therapy (Deleted)
 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   Past Surgical History:  Procedure Laterality Date   DIAGNOSTIC LAPAROSCOPY     MASTECTOMY W/ SENTINEL NODE BIOPSY Bilateral 09/16/2018   Procedure: RIGHT MASTECTOMY WITH RIGHT AXILLARY SENTINEL LYMPH NODE BIOPSY AND LEFT RISK REDUCING MASTECTOMY;  Surgeon: Enid Harry, MD;  Location: [facility];  Service: General;  Laterality: Bilateral;   Patient Active Problem List   Diagnosis Date Noted   Family history of cardiovascular disorder 09/12/2022   Breast cancer, right (HCC) 09/16/2018   Genetic testing 09/09/2018   Malignant neoplasm of upper-inner quadrant of right breast in female, estrogen receptor positive (HCC) 08/19/2018   Family history of breast cancer    History of penicillin allergy 09/24/2012   Exposure to strep throat 09/24/2012   Perioral dermatitis 09/24/2012   Left otitis media with effusion 08/16/2011   Hx of antibiotic allergy 05/03/2011   Disorder of teeth and supporting structures 03/30/2008   Allergic rhinitis 02/17/2008   Endometriosis 02/17/2008   Headache 02/17/2008   Hx gestational diabetes 02/17/2008   POSITIVE PPD 12/17/2007    PCP: Reginal Capra, MD   REFERRING PROVIDER: Marquetta Sit, MD  REFERRING DIAG: M54.9 (ICD-10-CM) - Upper back pain   THERAPY DIAG:  No diagnosis found.  Rationale for Evaluation and Treatment: Rehabilitation  ONSET DATE: chronic  SUBJECTIVE:                                                                                                                                                                                                          SUBJECTIVE STATEMENT: Symptoms continue to decrease in intensity.  Had a busy weekend w/o a marked exacerbation of symptoms.   Hand dominance: Right  PERTINENT HISTORY:  #2 upper back pain.  Musculoskeletal.  Has benefited from PT in the past and requesting repeat referral.  Referral is placed.   PAIN:  Are you having pain? Yes: NPRS scale: 3/10 Pain location: B scapular regions/flank  Pain description: dull/achy Aggravating factors: bending, twisting Relieving factors: position changes  PRECAUTIONS: None  RED FLAGS: None     WEIGHT BEARING RESTRICTIONS: No  FALLS:  Has patient fallen in last 6 months? No  OCCUPATION: office admin  PLOF: Independent  PATIENT GOALS: To manage my back pain  NEXT MD VISIT: TBD  OBJECTIVE:  Note: Objective measures were completed at Evaluation unless otherwise noted.  DIAGNOSTIC FINDINGS:  none  PATIENT SURVEYS:   ODI: 4/50  POSTURE: rounded shoulders, forward head, and increased thoracic kyphosis  PALPATION: TTP B rhomboids R>L  CERVICAL ROM:   Active ROM A/PROM (deg) eval  Flexion 90%  Extension 90%  Right lateral flexion 75%  Left lateral flexion 50%  Right rotation 75%  Left rotation 90%   (Blank rows = not tested)  UPPER EXTREMITY ROM: WNL B  Active ROM Right eval Left eval  Shoulder flexion    Shoulder extension    Shoulder abduction    Shoulder adduction    Shoulder extension    Shoulder internal rotation    Shoulder external rotation    Elbow flexion    Elbow extension    Wrist flexion    Wrist extension    Wrist ulnar deviation    Wrist radial deviation    Wrist pronation    Wrist supination     (Blank rows = not tested)  UPPER EXTREMITY MMT:  MMT Right eval Left eval  Shoulder flexion    Shoulder extension    Shoulder  abduction    Shoulder adduction    Shoulder extension    Shoulder internal rotation    Shoulder external rotation    Middle trapezius 4 4  Lower trapezius 4 4  Elbow flexion    Elbow extension    Wrist flexion    Wrist extension    Wrist ulnar deviation    Wrist radial deviation    Wrist pronation    Wrist supination    Grip strength     (Blank rows = not tested)  CERVICAL SPECIAL TESTS:  Neck flexor muscle endurance test: Negative and Spurling's test: Negative  FUNCTIONAL TESTS:  30 seconds chair stand test n/a  TREATMENT DATE:  [facility] Adult PT Treatment:                                                DATE: 07/23/23 Manual Therapy: Skilled palpation to identify taught and irritable bands in B teres major Trigger Point Dry Needling  Subsequent Treatment: Instructions provided previously at initial dry needling treatment.   Patient Verbal Consent Given: Yes Education Handout Provided: Previously Provided Muscles Treated: B teres major Electrical Stimulation Performed: No Treatment Response/Outcome: twitch responses B  Therapeutic Activity: Omega high row 17.5# 15x2 Omega low row 17.5# 15x2 Omega lat pulldown 17.5# 15x2 UBE L3 3/3 min Supine scapular retraction(hands b/d head) 10x  [facility] Adult PT Treatment:                                                DATE: 07/20/23 Manual Therapy: Skilled palpation to identify taught and irritable bands in L infraspinatus R teres major Trigger Point Dry Needling  Subsequent Treatment: Instructions provided previously at initial dry needling treatment.  Patient Verbal Consent Given: Yes Education Handout Provided: Previously Provided Muscles Treated: L infraspinatus R teres major Electrical Stimulation Performed: No Treatment Response/Outcome: twitch response followed by decreased discomfort and soft tissue irritation    Therapeutic Activity: Omega high row 15# 15x2 Omega low row 15# 15x2 Omega lat pulldown 15# 15x2 UBE L2  3/3 min Supine scapular retraction(hands b/d head) 10x  [facility] Adult PT Treatment:                                                DATE: 07/16/23  Manual Therapy: Skilled palpation to identify taught and irritable bands in R infraspinatus and Teres major  Trigger Point Dry Needling  Subsequent Treatment: Instructions provided previously at initial dry needling treatment.   Patient Verbal Consent Given: Yes Education Handout Provided: Previously Provided Muscles Treated: R infraspinatus and Teres major  Electrical Stimulation Performed: No Treatment Response/Outcome: twitch response f/b decreased muscle tension and irritability    Therapeutic Activity: Omega high row 17.5# 15x Omega low row 17.5# 15x Omega lat pulldown 17.5# 15x UBE L1.5 3/3 min Seated t-spine extension 10x with light PT OP  [facility] Adult PT Treatment:                                                DATE: 07/13/23 Therapeutic Exercise: UBE L1 3/3 min Manual Therapy: Skilled palpation to identify taught and irritable bands in B infraspinatus Trigger Point Dry Needling  Initial Treatment: Pt instructed on Dry Needling rational, procedures, and possible side effects. Pt instructed to expect mild to moderate muscle soreness later in the day and/or into the next day.  Pt instructed in methods to reduce muscle soreness. Pt instructed to continue prescribed HEP. Because Dry Needling was performed over or adjacent to a lung field, pt was educated on S/S of pneumothorax and to seek immediate medical attention should they occur.  Patient was educated on signs and symptoms of infection and other risk factors and advised to seek medical attention should they occur.  Patient verbalized understanding of these instructions and education.   Patient Verbal Consent Given: Yes Education Handout Provided: Previously Provided Muscles Treated: B infraspinatus Electrical Stimulation Performed: No Treatment Response/Outcome: decreased pain  and soft tissue tension     Therapeutic Activity: Omega high row 15# 15x Omega low row 15# 15x Omega lat pulldown 15# 15x Seated t-spine extension 10x with light PT OP  [facility] Adult PT Treatment:                                                DATE: 07/09/22 Eval and HEP Self Care: Additional minutes spent for educating on updated Therapeutic Home Exercise Program as well as comparing current status to condition at start of symptoms. This included exercises focusing on stretching, strengthening, with focus on eccentric aspects. Long term goals include an improvement in range of motion, strength, endurance as well as avoiding reinjury. Patient's frequency would include in 1-2 times a day, 3-5 times a week for a duration of 6-12 weeks. Proper technique shown and discussed handout in great detail. All questions  were discussed and addressed.                                                                                                                                 PATIENT EDUCATION:  Education details: Discussed eval findings, rehab rationale and POC and patient is in agreement  Person educated: Patient Education method: Explanation Education comprehension: verbalized understanding and needs further education  HOME EXERCISE PROGRAM: Access Code: [email or web address] URL: [email or web address] Date: 07/09/2023 Prepared by: Gretta Leavens  Exercises - Doorway Pec Stretch at 90 Degrees Abduction  - 2 x daily - 5 x weekly - 1 sets - 3 reps - 30s hold - Supine Shoulder Horizontal Abduction with Resistance  - 1 x daily - 5 x weekly - 2 sets - 15 reps - Shoulder extension with resistance - Neutral  - 1 x daily - 5 x weekly - 2 sets - 15 reps  ASSESSMENT:  CLINICAL IMPRESSION: Increased weight and resistance in order to build strength and endurance in postural muscles as well as facilitate ROM/stretch.  Able to tolerate increased workload with only fatigue reported.   Patient is a 58 y.o.  female who was seen today for physical therapy evaluation and treatment for chronic thoracic and flank pain. Patient presents with good shoulder mobility B.  ROM restrictions noted in cervical spine and weakness identified in posterior shoulders including rhomboids and middle traps.  Palpation finds TrPs to B rhomboids R>L.  Postural deficits of elevated and forward shoulder identified.  OBJECTIVE IMPAIRMENTS: decreased activity tolerance, decreased knowledge of condition, decreased strength, increased fascial restrictions, impaired perceived functional ability, postural dysfunction, and pain.   ACTIVITY LIMITATIONS: carrying, lifting, bending, and sitting  PERSONAL FACTORS: Age, Behavior pattern, Fitness, and Past/current experiences are also affecting patient's functional outcome.   REHAB POTENTIAL: Good  CLINICAL DECISION MAKING: Stable/uncomplicated  EVALUATION COMPLEXITY: Low   GOALS: Goals reviewed with patient? No  SHORT TERM GOALS: Target date: 07/30/2023  Patient to demonstrate independence in HEP  Baseline:  [id number] Goal status: INITIAL   LONG TERM GOALS: Target date: 08/20/2023  Patient will score at least 2/50 on NDI to signify clinically meaningful improvement in functional abilities.   Baseline: 4/50 Goal status: INITIAL  2.  Patient will acknowledge 2/10 pain at least once during episode of care   Baseline: 3/10 Goal status: INITIAL  3.  Increase cervical AROM to 90% Baseline:  Active ROM A/PROM (deg) eval  Flexion 90%  Extension 90%  Right lateral flexion 75%  Left lateral flexion 50%  Right rotation 75%  Left rotation 90%   Goal status: INITIAL  4.  Increase strength to 4+/5 in deficit regions Baseline:  MMT Right eval Left eval  Shoulder flexion    Shoulder extension    Shoulder abduction    Shoulder adduction    Shoulder extension    Shoulder internal rotation    Shoulder external rotation    Middle trapezius 4 4  Lower trapezius 4 4    Goal status: INITIAL     PLAN:  PT FREQUENCY: 2x/week  PT DURATION: 6 weeks  PLANNED INTERVENTIONS: 97164- PT Re-evaluation, 97110-Therapeutic exercises, 97530- Therapeutic activity, 97112- Neuromuscular re-education, 97535- Self Care, 16109- Manual therapy, Patient/Family education, Dry Needling, Joint mobilization, and Spinal mobilization  PLAN FOR NEXT SESSION: HEP review and update, manual techniques as appropriate, aerobic tasks, ROM and flexibility activities, strengthening and PREs, TPDN, gait and balance training as needed     Eldon Greenland, PT 07/24/2023, 12:39 PM

## 2023-07-25 ENCOUNTER — Ambulatory Visit

## 2023-07-29 NOTE — Therapy (Unsigned)
 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   Past Surgical History:  Procedure Laterality Date   DIAGNOSTIC LAPAROSCOPY     MASTECTOMY W/ SENTINEL NODE BIOPSY Bilateral 09/16/2018   Procedure: RIGHT MASTECTOMY WITH RIGHT AXILLARY SENTINEL LYMPH NODE BIOPSY AND LEFT RISK REDUCING MASTECTOMY;  Surgeon: Enid Harry, MD;  Location: [facility];  Service: General;  Laterality: Bilateral;   Patient Active Problem List   Diagnosis Date Noted   Family history of cardiovascular disorder 09/12/2022   Breast cancer, right (HCC) 09/16/2018   Genetic testing 09/09/2018   Malignant neoplasm of upper-inner quadrant of right breast in female, estrogen receptor positive (HCC) 08/19/2018   Family history of breast cancer    History of penicillin allergy 09/24/2012   Exposure to strep throat 09/24/2012   Perioral dermatitis 09/24/2012   Left otitis media with effusion 08/16/2011   Hx of antibiotic allergy 05/03/2011   Disorder of teeth and supporting structures 03/30/2008   Allergic rhinitis 02/17/2008   Endometriosis 02/17/2008   Headache 02/17/2008   Hx gestational diabetes 02/17/2008   POSITIVE PPD 12/17/2007    PCP: Reginal Capra, MD   REFERRING PROVIDER: Marquetta Sit, MD  REFERRING DIAG: M54.9 (ICD-10-CM) - Upper back pain   THERAPY DIAG:  No diagnosis found.  Rationale for Evaluation and Treatment: Rehabilitation  ONSET DATE: chronic  SUBJECTIVE:                                                                                                                                                                                                          SUBJECTIVE STATEMENT: Symptoms continue to decrease in intensity.  Had a busy weekend w/o a marked exacerbation of symptoms.   Hand dominance: Right  PERTINENT HISTORY:  #2 upper back pain.  Musculoskeletal.  Has benefited from PT in the past and requesting repeat referral.  Referral is placed.   PAIN:  Are you having pain? Yes: NPRS scale: 3/10 Pain location: B scapular regions/flank  Pain description: dull/achy Aggravating factors: bending, twisting Relieving factors: position changes  PRECAUTIONS: None  RED FLAGS: None     WEIGHT BEARING RESTRICTIONS: No  FALLS:  Has patient fallen in last 6 months? No  OCCUPATION: office admin  PLOF: Independent  PATIENT GOALS: To manage my back pain  NEXT MD VISIT: TBD  OBJECTIVE:  Note: Objective measures were completed at Evaluation unless otherwise noted.  DIAGNOSTIC FINDINGS:  none  PATIENT SURVEYS:   ODI: 4/50  POSTURE: rounded shoulders, forward head, and increased thoracic kyphosis  PALPATION: TTP B rhomboids R>L  CERVICAL ROM:   Active ROM A/PROM (deg) eval  Flexion 90%  Extension 90%  Right lateral flexion 75%  Left lateral flexion 50%  Right rotation 75%  Left rotation 90%   (Blank rows = not tested)  UPPER EXTREMITY ROM: WNL B  Active ROM Right eval Left eval  Shoulder flexion    Shoulder extension    Shoulder abduction    Shoulder adduction    Shoulder extension    Shoulder internal rotation    Shoulder external rotation    Elbow flexion    Elbow extension    Wrist flexion    Wrist extension    Wrist ulnar deviation    Wrist radial deviation    Wrist pronation    Wrist supination     (Blank rows = not tested)  UPPER EXTREMITY MMT:  MMT Right eval Left eval  Shoulder flexion    Shoulder extension    Shoulder  abduction    Shoulder adduction    Shoulder extension    Shoulder internal rotation    Shoulder external rotation    Middle trapezius 4 4  Lower trapezius 4 4  Elbow flexion    Elbow extension    Wrist flexion    Wrist extension    Wrist ulnar deviation    Wrist radial deviation    Wrist pronation    Wrist supination    Grip strength     (Blank rows = not tested)  CERVICAL SPECIAL TESTS:  Neck flexor muscle endurance test: Negative and Spurling's test: Negative  FUNCTIONAL TESTS:  30 seconds chair stand test n/a  TREATMENT DATE:  [facility] Adult PT Treatment:                                                DATE: 07/23/23 Manual Therapy: Skilled palpation to identify taught and irritable bands in B teres major Trigger Point Dry Needling  Subsequent Treatment: Instructions provided previously at initial dry needling treatment.   Patient Verbal Consent Given: Yes Education Handout Provided: Previously Provided Muscles Treated: B teres major Electrical Stimulation Performed: No Treatment Response/Outcome: twitch responses B  Therapeutic Activity: Omega high row 17.5# 15x2 Omega low row 17.5# 15x2 Omega lat pulldown 17.5# 15x2 UBE L3 3/3 min Supine scapular retraction(hands b/d head) 10x  [facility] Adult PT Treatment:                                                DATE: 07/20/23 Manual Therapy: Skilled palpation to identify taught and irritable bands in L infraspinatus R teres major Trigger Point Dry Needling  Subsequent Treatment: Instructions provided previously at initial dry needling treatment.  Patient Verbal Consent Given: Yes Education Handout Provided: Previously Provided Muscles Treated: L infraspinatus R teres major Electrical Stimulation Performed: No Treatment Response/Outcome: twitch response followed by decreased discomfort and soft tissue irritation    Therapeutic Activity: Omega high row 15# 15x2 Omega low row 15# 15x2 Omega lat pulldown 15# 15x2 UBE L2  3/3 min Supine scapular retraction(hands b/d head) 10x  [facility] Adult PT Treatment:                                                DATE: 07/16/23  Manual Therapy: Skilled palpation to identify taught and irritable bands in R infraspinatus and Teres major  Trigger Point Dry Needling  Subsequent Treatment: Instructions provided previously at initial dry needling treatment.   Patient Verbal Consent Given: Yes Education Handout Provided: Previously Provided Muscles Treated: R infraspinatus and Teres major  Electrical Stimulation Performed: No Treatment Response/Outcome: twitch response f/b decreased muscle tension and irritability    Therapeutic Activity: Omega high row 17.5# 15x Omega low row 17.5# 15x Omega lat pulldown 17.5# 15x UBE L1.5 3/3 min Seated t-spine extension 10x with light PT OP  [facility] Adult PT Treatment:                                                DATE: 07/13/23 Therapeutic Exercise: UBE L1 3/3 min Manual Therapy: Skilled palpation to identify taught and irritable bands in B infraspinatus Trigger Point Dry Needling  Initial Treatment: Pt instructed on Dry Needling rational, procedures, and possible side effects. Pt instructed to expect mild to moderate muscle soreness later in the day and/or into the next day.  Pt instructed in methods to reduce muscle soreness. Pt instructed to continue prescribed HEP. Because Dry Needling was performed over or adjacent to a lung field, pt was educated on S/S of pneumothorax and to seek immediate medical attention should they occur.  Patient was educated on signs and symptoms of infection and other risk factors and advised to seek medical attention should they occur.  Patient verbalized understanding of these instructions and education.   Patient Verbal Consent Given: Yes Education Handout Provided: Previously Provided Muscles Treated: B infraspinatus Electrical Stimulation Performed: No Treatment Response/Outcome: decreased pain  and soft tissue tension     Therapeutic Activity: Omega high row 15# 15x Omega low row 15# 15x Omega lat pulldown 15# 15x Seated t-spine extension 10x with light PT OP  [facility] Adult PT Treatment:                                                DATE: 07/09/22 Eval and HEP Self Care: Additional minutes spent for educating on updated Therapeutic Home Exercise Program as well as comparing current status to condition at start of symptoms. This included exercises focusing on stretching, strengthening, with focus on eccentric aspects. Long term goals include an improvement in range of motion, strength, endurance as well as avoiding reinjury. Patient's frequency would include in 1-2 times a day, 3-5 times a week for a duration of 6-12 weeks. Proper technique shown and discussed handout in great detail. All questions  were discussed and addressed.                                                                                                                                 PATIENT EDUCATION:  Education details: Discussed eval findings, rehab rationale and POC and patient is in agreement  Person educated: Patient Education method: Explanation Education comprehension: verbalized understanding and needs further education  HOME EXERCISE PROGRAM: Access Code: [email or web address] URL: [email or web address] Date: 07/09/2023 Prepared by: Gretta Leavens  Exercises - Doorway Pec Stretch at 90 Degrees Abduction  - 2 x daily - 5 x weekly - 1 sets - 3 reps - 30s hold - Supine Shoulder Horizontal Abduction with Resistance  - 1 x daily - 5 x weekly - 2 sets - 15 reps - Shoulder extension with resistance - Neutral  - 1 x daily - 5 x weekly - 2 sets - 15 reps  ASSESSMENT:  CLINICAL IMPRESSION: Increased weight and resistance in order to build strength and endurance in postural muscles as well as facilitate ROM/stretch.  Able to tolerate increased workload with only fatigue reported.   Patient is a 58 y.o.  female who was seen today for physical therapy evaluation and treatment for chronic thoracic and flank pain. Patient presents with good shoulder mobility B.  ROM restrictions noted in cervical spine and weakness identified in posterior shoulders including rhomboids and middle traps.  Palpation finds TrPs to B rhomboids R>L.  Postural deficits of elevated and forward shoulder identified.  OBJECTIVE IMPAIRMENTS: decreased activity tolerance, decreased knowledge of condition, decreased strength, increased fascial restrictions, impaired perceived functional ability, postural dysfunction, and pain.   ACTIVITY LIMITATIONS: carrying, lifting, bending, and sitting  PERSONAL FACTORS: Age, Behavior pattern, Fitness, and Past/current experiences are also affecting patient's functional outcome.   REHAB POTENTIAL: Good  CLINICAL DECISION MAKING: Stable/uncomplicated  EVALUATION COMPLEXITY: Low   GOALS: Goals reviewed with patient? No  SHORT TERM GOALS: Target date: 07/30/2023  Patient to demonstrate independence in HEP  Baseline:  [id number] Goal status: INITIAL   LONG TERM GOALS: Target date: 08/20/2023  Patient will score at least 2/50 on NDI to signify clinically meaningful improvement in functional abilities.   Baseline: 4/50 Goal status: INITIAL  2.  Patient will acknowledge 2/10 pain at least once during episode of care   Baseline: 3/10 Goal status: INITIAL  3.  Increase cervical AROM to 90% Baseline:  Active ROM A/PROM (deg) eval  Flexion 90%  Extension 90%  Right lateral flexion 75%  Left lateral flexion 50%  Right rotation 75%  Left rotation 90%   Goal status: INITIAL  4.  Increase strength to 4+/5 in deficit regions Baseline:  MMT Right eval Left eval  Shoulder flexion    Shoulder extension    Shoulder abduction    Shoulder adduction    Shoulder extension    Shoulder internal rotation    Shoulder external rotation    Middle trapezius 4 4  Lower trapezius 4 4    Goal status: INITIAL     PLAN:  PT FREQUENCY: 2x/week  PT DURATION: 6 weeks  PLANNED INTERVENTIONS: 97164- PT Re-evaluation, 97110-Therapeutic exercises, 97530- Therapeutic activity, 97112- Neuromuscular re-education, 97535- Self Care, 16109- Manual therapy, Patient/Family education, Dry Needling, Joint mobilization, and Spinal mobilization  PLAN FOR NEXT SESSION: HEP review and update, manual techniques as appropriate, aerobic tasks, ROM and flexibility activities, strengthening and PREs, TPDN, gait and balance training as needed     Eldon Greenland, PT 07/29/2023, 11:15 AM

## 2023-07-30 ENCOUNTER — Ambulatory Visit: Admitting: Physical Therapy

## 2023-07-30 ENCOUNTER — Encounter (HOSPITAL_COMMUNITY): Payer: Self-pay

## 2023-07-30 ENCOUNTER — Ambulatory Visit (HOSPITAL_COMMUNITY)

## 2023-07-30 ENCOUNTER — Encounter: Payer: Self-pay | Admitting: Physical Therapy

## 2023-07-30 DIAGNOSIS — M6281 Muscle weakness (generalized): Secondary | ICD-10-CM

## 2023-07-30 DIAGNOSIS — M546 Pain in thoracic spine: Secondary | ICD-10-CM | POA: Diagnosis not present

## 2023-07-30 NOTE — Therapy (Signed)
 OUTPATIENT PHYSICAL THERAPY TREATMENT NOTE   Patient Name: Megan Armstrong MRN: [id number] DOB:[birth year], 58 y.o., female Today's Date: 07/30/2023  END OF SESSION:  PT End of Session - 07/30/23 0753     Visit Number 6    Number of Visits 12    Date for PT Re-Evaluation 09/08/23    Authorization Type c[employer]    PT Start Time [phone]    PT Stop Time 0829    PT Time Calculation (min) 32 min                 Past Medical History:  Diagnosis Date   Allergic rhinitis    Endometrial hyperplasia    Endometriosis    Family history of breast cancer    Family history of breast cancer    Hx of endometriosis    rectal vag with laser surgery    Seasonal allergies    TB skin/subcutaneous    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   Past Surgical History:  Procedure Laterality Date   DIAGNOSTIC LAPAROSCOPY     MASTECTOMY W/ SENTINEL NODE BIOPSY Bilateral 09/16/2018   Procedure: RIGHT MASTECTOMY WITH RIGHT AXILLARY SENTINEL LYMPH NODE BIOPSY AND LEFT RISK REDUCING MASTECTOMY;  Surgeon: Enid Harry, MD;  Location: [facility];  Service: General;  Laterality: Bilateral;   Patient Active Problem List   Diagnosis Date Noted   Family history of cardiovascular disorder 09/12/2022   Breast cancer, right (HCC) 09/16/2018   Genetic testing 09/09/2018   Malignant neoplasm of upper-inner quadrant of right breast in female, estrogen receptor positive (HCC) 08/19/2018   Family history of breast cancer    History of penicillin allergy 09/24/2012   Exposure to strep throat 09/24/2012   Perioral dermatitis 09/24/2012   Left otitis media with effusion 08/16/2011   Hx of antibiotic allergy 05/03/2011   Disorder of teeth and supporting structures 03/30/2008   Allergic rhinitis 02/17/2008   Endometriosis 02/17/2008   Headache 02/17/2008   Hx gestational diabetes 02/17/2008   POSITIVE PPD 12/17/2007    PCP: Reginal Capra, MD   REFERRING  PROVIDER: Marquetta Sit, MD  REFERRING DIAG: M54.9 (ICD-10-CM) - Upper back pain   THERAPY DIAG:  Pain in thoracic spine  Muscle weakness (generalized)  Rationale for Evaluation and Treatment: Rehabilitation  ONSET DATE: chronic  SUBJECTIVE:  SUBJECTIVE STATEMENT: Symptoms continue to decrease in intensity.  Had respiratory infection over the weekend so has not exercised. Feels mild tightness lateral scapula bilateral.   Hand dominance: Right  PERTINENT HISTORY:  #2 upper back pain.  Musculoskeletal.  Has benefited from PT in the past and requesting repeat referral.  Referral is placed.   PAIN:  Are you having pain? Yes: NPRS scale: 0/10 Pain location: B scapular regions/flank Pain description: dull/achy Aggravating factors: bending, twisting Relieving factors: position changes  PRECAUTIONS: None  RED FLAGS: None     WEIGHT BEARING RESTRICTIONS: No  FALLS:  Has patient fallen in last 6 months? No  OCCUPATION: office admin  PLOF: Independent  PATIENT GOALS: To manage my back pain  NEXT MD VISIT: TBD  OBJECTIVE:  Note: Objective measures were completed at Evaluation unless otherwise noted.  DIAGNOSTIC FINDINGS:  none  PATIENT SURVEYS:   ODI: 4/50  POSTURE: rounded shoulders, forward head, and increased thoracic kyphosis  PALPATION: TTP B rhomboids R>L  CERVICAL ROM:   Active ROM A/PROM (deg) eval  Flexion 90%  Extension 90%  Right lateral flexion 75%  Left lateral flexion 50%  Right rotation 75%  Left rotation 90%   (Blank rows = not tested)  UPPER EXTREMITY ROM: WNL B  Active ROM Right eval Left eval  Shoulder flexion    Shoulder extension    Shoulder abduction    Shoulder adduction    Shoulder extension    Shoulder internal rotation     Shoulder external rotation    Elbow flexion    Elbow extension    Wrist flexion    Wrist extension    Wrist ulnar deviation    Wrist radial deviation    Wrist pronation    Wrist supination     (Blank rows = not tested)  UPPER EXTREMITY MMT:  MMT Right eval Left eval  Shoulder flexion    Shoulder extension    Shoulder abduction    Shoulder adduction    Shoulder extension    Shoulder internal rotation    Shoulder external rotation    Middle trapezius 4 4  Lower trapezius 4 4  Elbow flexion    Elbow extension    Wrist flexion    Wrist extension    Wrist ulnar deviation    Wrist radial deviation    Wrist pronation    Wrist supination    Grip strength     (Blank rows = not tested)  CERVICAL SPECIAL TESTS:  Neck flexor muscle endurance test: Negative and Spurling's test: Negative  FUNCTIONAL TESTS:  30 seconds chair stand test n/a  TREATMENT DATE:  [facility] Adult PT Treatment:                                                DATE: 07/30/23  Therapeutic Activity: Omega high row 20# 15x2 Omega low row 20# 15x2 Omega lat pulldown 20# 15x2 UBE L1.5 2.5/2.5 min Seated thoracic extension hands b/d head x 10- foam roller in chair S/L upper trunk rotations (open books) x 10 each - top leg extended Supine GTB Horiz abdct x15 Supine scapular retraction(hands b/d head) 10x Supine GTB alternating star pattern x 15    [facility] Adult PT Treatment:  DATE: 07/23/23 Manual Therapy: Skilled palpation to identify taught and irritable bands in B teres major Trigger Point Dry Needling  Subsequent Treatment: Instructions provided previously at initial dry needling treatment.   Patient Verbal Consent Given: Yes Education Handout Provided: Previously Provided Muscles Treated: B teres major Electrical Stimulation Performed: No Treatment Response/Outcome: twitch responses B  Therapeutic Activity: Omega high row 17.5# 15x2 Omega low row  17.5# 15x2 Omega lat pulldown 17.5# 15x2 UBE L3 3/3 min Supine scapular retraction(hands b/d head) 10x  [facility] Adult PT Treatment:                                                DATE: 07/20/23 Manual Therapy: Skilled palpation to identify taught and irritable bands in L infraspinatus R teres major Trigger Point Dry Needling  Subsequent Treatment: Instructions provided previously at initial dry needling treatment.   Patient Verbal Consent Given: Yes Education Handout Provided: Previously Provided Muscles Treated: L infraspinatus R teres major Electrical Stimulation Performed: No Treatment Response/Outcome: twitch response followed by decreased discomfort and soft tissue irritation    Therapeutic Activity: Omega high row 15# 15x2 Omega low row 15# 15x2 Omega lat pulldown 15# 15x2 UBE L2 3/3 min Supine scapular retraction(hands b/d head) 10x  [facility] Adult PT Treatment:                                                DATE: 07/16/23  Manual Therapy: Skilled palpation to identify taught and irritable bands in R infraspinatus and Teres major  Trigger Point Dry Needling  Subsequent Treatment: Instructions provided previously at initial dry needling treatment.   Patient Verbal Consent Given: Yes Education Handout Provided: Previously Provided Muscles Treated: R infraspinatus and Teres major  Electrical Stimulation Performed: No Treatment Response/Outcome: twitch response f/b decreased muscle tension and irritability    Therapeutic Activity: Omega high row 17.5# 15x Omega low row 17.5# 15x Omega lat pulldown 17.5# 15x UBE L1.5 3/3 min Seated t-spine extension 10x with light PT OP  [facility] Adult PT Treatment:                                                DATE: 07/13/23 Therapeutic Exercise: UBE L1 3/3 min Manual Therapy: Skilled palpation to identify taught and irritable bands in B infraspinatus Trigger Point Dry Needling  Initial Treatment: Pt instructed on Dry Needling rational,  procedures, and possible side effects. Pt instructed to expect mild to moderate muscle soreness later in the day and/or into the next day.  Pt instructed in methods to reduce muscle soreness. Pt instructed to continue prescribed HEP. Because Dry Needling was performed over or adjacent to a lung field, pt was educated on S/S of pneumothorax and to seek immediate medical attention should they occur.  Patient was educated on signs and symptoms of infection and other risk factors and advised to seek medical attention should they occur.  Patient verbalized understanding of these instructions and education.   Patient Verbal Consent Given: Yes Education Handout Provided: Previously Provided Muscles Treated: B infraspinatus Electrical Stimulation Performed: No Treatment Response/Outcome: decreased pain and soft  tissue tension     Therapeutic Activity: Omega high row 15# 15x Omega low row 15# 15x Omega lat pulldown 15# 15x Seated t-spine extension 10x with light PT OP  [facility] Adult PT Treatment:                                                DATE: 07/09/22 Eval and HEP Self Care: Additional minutes spent for educating on updated Therapeutic Home Exercise Program as well as comparing current status to condition at start of symptoms. This included exercises focusing on stretching, strengthening, with focus on eccentric aspects. Long term goals include an improvement in range of motion, strength, endurance as well as avoiding reinjury. Patient's frequency would include in 1-2 times a day, 3-5 times a week for a duration of 6-12 weeks. Proper technique shown and discussed handout in great detail. All questions were discussed and addressed.                                                                                                                                 PATIENT EDUCATION:  Education details: Discussed eval findings, rehab rationale and POC and patient is in agreement  Person educated:  Patient Education method: Explanation Education comprehension: verbalized understanding and needs further education  HOME EXERCISE PROGRAM: Access Code: [email or web address] URL: [email or web address] Date: 07/09/2023 Prepared by: Gretta Leavens  Exercises - Doorway Pec Stretch at 90 Degrees Abduction  - 2 x daily - 5 x weekly - 1 sets - 3 reps - 30s hold - Supine Shoulder Horizontal Abduction with Resistance  - 1 x daily - 5 x weekly - 2 sets - 15 reps - Shoulder extension with resistance - Neutral  - 1 x daily - 5 x weekly - 2 sets - 15 reps  ASSESSMENT:  CLINICAL IMPRESSION: Increased weight and resistance in order to build strength and endurance in postural muscles as well as facilitate ROM/stretch.  Able to tolerate increased workload with only fatigue reported.   Patient is a 58 y.o. female who was seen today for physical therapy evaluation and treatment for chronic thoracic and flank pain. Patient presents with good shoulder mobility B.  ROM restrictions noted in cervical spine and weakness identified in posterior shoulders including rhomboids and middle traps.  Palpation finds TrPs to B rhomboids R>L.  Postural deficits of elevated and forward shoulder identified.  OBJECTIVE IMPAIRMENTS: decreased activity tolerance, decreased knowledge of condition, decreased strength, increased fascial restrictions, impaired perceived functional ability, postural dysfunction, and pain.   ACTIVITY LIMITATIONS: carrying, lifting, bending, and sitting  PERSONAL FACTORS: Age, Behavior pattern, Fitness, and Past/current experiences are also affecting patient's functional outcome.   REHAB POTENTIAL: Good  CLINICAL DECISION MAKING: Stable/uncomplicated  EVALUATION COMPLEXITY: Low   GOALS: Goals reviewed with patient? No  SHORT TERM GOALS: Target date:  07/30/2023  Patient to demonstrate independence in HEP  Baseline:  [id number] Goal status: MET   LONG TERM GOALS: Target date:  08/20/2023  Patient will score at least 2/50 on NDI to signify clinically meaningful improvement in functional abilities.   Baseline: 4/50 Goal status: INITIAL  2.  Patient will acknowledge 2/10 pain at least once during episode of care   Baseline: 3/10 Goal status: INITIAL  3.  Increase cervical AROM to 90% Baseline:  Active ROM A/PROM (deg) eval  Flexion 90%  Extension 90%  Right lateral flexion 75%  Left lateral flexion 50%  Right rotation 75%  Left rotation 90%   Goal status: INITIAL  4.  Increase strength to 4+/5 in deficit regions Baseline:  MMT Right eval Left eval  Shoulder flexion    Shoulder extension    Shoulder abduction    Shoulder adduction    Shoulder extension    Shoulder internal rotation    Shoulder external rotation    Middle trapezius 4 4  Lower trapezius 4 4   Goal status: INITIAL     PLAN:  PT FREQUENCY: 2x/week  PT DURATION: 6 weeks  PLANNED INTERVENTIONS: 97164- PT Re-evaluation, 97110-Therapeutic exercises, 97530- Therapeutic activity, 97112- Neuromuscular re-education, 97535- Self Care, [id number]- Manual therapy, Patient/Family education, Dry Needling, Joint mobilization, and Spinal mobilization  PLAN FOR NEXT SESSION: HEP review and update, manual techniques as appropriate, aerobic tasks, ROM and flexibility activities, strengthening and PREs, TPDN, gait and balance training as needed     Gasper Karst, PTA 07/30/23 8:30 AM Phone: [phone] Fax: [phone]

## 2023-08-03 ENCOUNTER — Encounter

## 2023-08-06 ENCOUNTER — Encounter

## 2023-08-07 ENCOUNTER — Encounter: Admitting: Internal Medicine

## 2023-08-09 NOTE — Therapy (Signed)
 OUTPATIENT PHYSICAL THERAPY TREATMENT NOTE   Patient Name: Megan Armstrong MRN: [id number] DOB:[birth year], 58 y.o., female Today's Date: 08/10/2023  END OF SESSION:  PT End of Session - 08/10/23 0751     Visit Number 7    Number of Visits 12    Date for PT Re-Evaluation 09/08/23    Authorization Type cigna    PT Start Time [phone]    PT Stop Time 0830    PT Time Calculation (min) 40 min    Activity Tolerance Patient tolerated treatment well    Behavior During Therapy WFL for tasks assessed/performed            Past Medical History:  Diagnosis Date   Allergic rhinitis    Endometrial hyperplasia    Endometriosis    Family history of breast cancer    Family history of breast cancer    Hx of endometriosis    rectal vag with laser surgery    Seasonal allergies    TB skin/subcutaneous    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   Past Surgical History:  Procedure Laterality Date   DIAGNOSTIC LAPAROSCOPY     MASTECTOMY W/ SENTINEL NODE BIOPSY Bilateral 09/16/2018   Procedure: RIGHT MASTECTOMY WITH RIGHT AXILLARY SENTINEL LYMPH NODE BIOPSY AND LEFT RISK REDUCING MASTECTOMY;  Surgeon: Enid Harry, MD;  Location: [facility];  Service: General;  Laterality: Bilateral;   Patient Active Problem List   Diagnosis Date Noted   Family history of cardiovascular disorder 09/12/2022   Breast cancer, right (HCC) 09/16/2018   Genetic testing 09/09/2018   Malignant neoplasm of upper-inner quadrant of right breast in female, estrogen receptor positive (HCC) 08/19/2018   Family history of breast cancer    History of penicillin allergy 09/24/2012   Exposure to strep throat 09/24/2012   Perioral dermatitis 09/24/2012   Left otitis media with effusion 08/16/2011   Hx of antibiotic allergy 05/03/2011   Disorder of teeth and supporting structures 03/30/2008   Allergic rhinitis 02/17/2008   Endometriosis 02/17/2008   Headache 02/17/2008   Hx  gestational diabetes 02/17/2008   POSITIVE PPD 12/17/2007    PCP: Reginal Capra, MD   REFERRING PROVIDER: Marquetta Sit, MD  REFERRING DIAG: M54.9 (ICD-10-CM) - Upper back pain   THERAPY DIAG:  Pain in thoracic spine  Muscle weakness (generalized)  Abnormal posture  Rationale for Evaluation and Treatment: Rehabilitation  ONSET DATE: chronic  SUBJECTIVE:  SUBJECTIVE STATEMENT: Symptoms described as mainly numbness and tingling.  Notes posterior shoulder discomfort following prolonged positioning and with strenuous tasks such as gardening.  Hand dominance: Right  PERTINENT HISTORY:  #2 upper back pain.  Musculoskeletal.  Has benefited from PT in the past and requesting repeat referral.  Referral is placed.   PAIN:  Are you having pain? Yes: NPRS scale: 0/10 Pain location: B scapular regions/flank Pain description: dull/achy Aggravating factors: bending, twisting Relieving factors: position changes  PRECAUTIONS: None  RED FLAGS: None     WEIGHT BEARING RESTRICTIONS: No  FALLS:  Has patient fallen in last 6 months? No  OCCUPATION: office admin  PLOF: Independent  PATIENT GOALS: To manage my back pain  NEXT MD VISIT: TBD  OBJECTIVE:  Note: Objective measures were completed at Evaluation unless otherwise noted.  DIAGNOSTIC FINDINGS:  none  PATIENT SURVEYS:   ODI: 4/50  POSTURE: rounded shoulders, forward head, and increased thoracic kyphosis  PALPATION: TTP B rhomboids R>L  CERVICAL ROM:   Active ROM A/PROM (deg) eval 08/10/23  Flexion 90% 90%  Extension 90% 90%  Right lateral flexion 75% 75%  Left lateral flexion 50% 75%  Right rotation 75% 90%  Left rotation 90% 90%   (Blank rows = not tested)  UPPER EXTREMITY ROM: WNL B  Active ROM  Right eval Left eval  Shoulder flexion    Shoulder extension    Shoulder abduction    Shoulder adduction    Shoulder extension    Shoulder internal rotation    Shoulder external rotation    Elbow flexion    Elbow extension    Wrist flexion    Wrist extension    Wrist ulnar deviation    Wrist radial deviation    Wrist pronation    Wrist supination     (Blank rows = not tested)  UPPER EXTREMITY MMT:  MMT Right eval Left eval B 08/10/23  Shoulder flexion   4(prone)  Shoulder extension     Shoulder abduction     Shoulder adduction     Shoulder extension     Shoulder internal rotation     Shoulder external rotation     Middle trapezius 4 4 4   Lower trapezius 4 4 4   Elbow flexion     Elbow extension     Wrist flexion     Wrist extension     Wrist ulnar deviation     Wrist radial deviation     Wrist pronation     Wrist supination     Grip strength      (Blank rows = not tested)  CERVICAL SPECIAL TESTS:  Neck flexor muscle endurance test: Negative and Spurling's test: Negative  FUNCTIONAL TESTS:  30 seconds chair stand test n/a  TREATMENT DATE:  [facility] Adult PT Treatment:                                                DATE: 08/10/23 Therapeutic Exercise: Nustep L2 8 min Neuromuscular re-ed: Supine OH flexion 1# 15/15 Prone flexion 1# 15x Prone extension 1# 15x Prone hor abd 1# 15x Prone Ws 1#15x Seated OH press 1# 15x Therapeutic Activity: Re-assessment of ROM and function  [facility] Adult PT Treatment:  DATE: 07/30/23  Therapeutic Activity: Omega high row 20# 15x2 Omega low row 20# 15x2 Omega lat pulldown 20# 15x2 UBE L1.5 2.5/2.5 min Seated thoracic extension hands b/d head x 10- foam roller in chair S/L upper trunk rotations (open books) x 10 each - top leg extended Supine GTB Horiz abdct x15 Supine scapular retraction(hands b/d head) 10x Supine GTB alternating star pattern x 15    [facility] Adult PT Treatment:                                                 DATE: 07/23/23 Manual Therapy: Skilled palpation to identify taught and irritable bands in B teres major Trigger Point Dry Needling  Subsequent Treatment: Instructions provided previously at initial dry needling treatment.   Patient Verbal Consent Given: Yes Education Handout Provided: Previously Provided Muscles Treated: B teres major Electrical Stimulation Performed: No Treatment Response/Outcome: twitch responses B  Therapeutic Activity: Omega high row 17.5# 15x2 Omega low row 17.5# 15x2 Omega lat pulldown 17.5# 15x2 UBE L3 3/3 min Supine scapular retraction(hands b/d head) 10x  [facility] Adult PT Treatment:                                                DATE: 07/20/23 Manual Therapy: Skilled palpation to identify taught and irritable bands in L infraspinatus R teres major Trigger Point Dry Needling  Subsequent Treatment: Instructions provided previously at initial dry needling treatment.   Patient Verbal Consent Given: Yes Education Handout Provided: Previously Provided Muscles Treated: L infraspinatus R teres major Electrical Stimulation Performed: No Treatment Response/Outcome: twitch response followed by decreased discomfort and soft tissue irritation    Therapeutic Activity: Omega high row 15# 15x2 Omega low row 15# 15x2 Omega lat pulldown 15# 15x2 UBE L2 3/3 min Supine scapular retraction(hands b/d head) 10x  [facility] Adult PT Treatment:                                                DATE: 07/16/23  Manual Therapy: Skilled palpation to identify taught and irritable bands in R infraspinatus and Teres major  Trigger Point Dry Needling  Subsequent Treatment: Instructions provided previously at initial dry needling treatment.   Patient Verbal Consent Given: Yes Education Handout Provided: Previously Provided Muscles Treated: R infraspinatus and Teres major  Electrical Stimulation Performed: No Treatment Response/Outcome: twitch  response f/b decreased muscle tension and irritability    Therapeutic Activity: Omega high row 17.5# 15x Omega low row 17.5# 15x Omega lat pulldown 17.5# 15x UBE L1.5 3/3 min Seated t-spine extension 10x with light PT OP  [facility] Adult PT Treatment:                                                DATE: 07/13/23 Therapeutic Exercise: UBE L1 3/3 min Manual Therapy: Skilled palpation to identify taught and irritable bands in B infraspinatus Trigger Point Dry Needling  Initial Treatment: Pt instructed on Dry Needling rational, procedures, and possible  side effects. Pt instructed to expect mild to moderate muscle soreness later in the day and/or into the next day.  Pt instructed in methods to reduce muscle soreness. Pt instructed to continue prescribed HEP. Because Dry Needling was performed over or adjacent to a lung field, pt was educated on S/S of pneumothorax and to seek immediate medical attention should they occur.  Patient was educated on signs and symptoms of infection and other risk factors and advised to seek medical attention should they occur.  Patient verbalized understanding of these instructions and education.   Patient Verbal Consent Given: Yes Education Handout Provided: Previously Provided Muscles Treated: B infraspinatus Electrical Stimulation Performed: No Treatment Response/Outcome: decreased pain and soft tissue tension     Therapeutic Activity: Omega high row 15# 15x Omega low row 15# 15x Omega lat pulldown 15# 15x Seated t-spine extension 10x with light PT OP  [facility] Adult PT Treatment:                                                DATE: 07/09/22 Eval and HEP Self Care: Additional minutes spent for educating on updated Therapeutic Home Exercise Program as well as comparing current status to condition at start of symptoms. This included exercises focusing on stretching, strengthening, with focus on eccentric aspects. Long term goals include an improvement in range  of motion, strength, endurance as well as avoiding reinjury. Patient's frequency would include in 1-2 times a day, 3-5 times a week for a duration of 6-12 weeks. Proper technique shown and discussed handout in great detail. All questions were discussed and addressed.                                                                                                                                 PATIENT EDUCATION:  Education details: Discussed eval findings, rehab rationale and POC and patient is in agreement  Person educated: Patient Education method: Explanation Education comprehension: verbalized understanding and needs further education  HOME EXERCISE PROGRAM: Access Code: [email or web address] URL: [email or web address] Date: 07/09/2023 Prepared by: Gretta Leavens  Exercises - Doorway Pec Stretch at 90 Degrees Abduction  - 2 x daily - 5 x weekly - 1 sets - 3 reps - 30s hold - Supine Shoulder Horizontal Abduction with Resistance  - 1 x daily - 5 x weekly - 2 sets - 15 reps - Shoulder extension with resistance - Neutral  - 1 x daily - 5 x weekly - 2 sets - 15 reps  ASSESSMENT:  CLINICAL IMPRESSION: Continued but lessened posterior thoracic and shoulder discomfort.  Re-assessment ROM and progress towards goals.  Incorporated additional postural strengthening and mobilization tasks which highlighted strength deficits.  Continued soft tissue restrictions in chest wall produce stretch on posterior shoulder and thoracic spine leading to muscle elongation and subsequent pain  and weakness.  Patient is a 58 y.o. female who was seen today for physical therapy evaluation and treatment for chronic thoracic and flank pain. Patient presents with good shoulder mobility B.  ROM restrictions noted in cervical spine and weakness identified in posterior shoulders including rhomboids and middle traps.  Palpation finds TrPs to B rhomboids R>L.  Postural deficits of elevated and forward shoulder  identified.  OBJECTIVE IMPAIRMENTS: decreased activity tolerance, decreased knowledge of condition, decreased strength, increased fascial restrictions, impaired perceived functional ability, postural dysfunction, and pain.   ACTIVITY LIMITATIONS: carrying, lifting, bending, and sitting  PERSONAL FACTORS: Age, Behavior pattern, Fitness, and Past/current experiences are also affecting patient's functional outcome.   REHAB POTENTIAL: Good  CLINICAL DECISION MAKING: Stable/uncomplicated  EVALUATION COMPLEXITY: Low   GOALS: Goals reviewed with patient? No  SHORT TERM GOALS: Target date: 07/30/2023  Patient to demonstrate independence in HEP  Baseline:  [id number] Goal status: MET   LONG TERM GOALS: Target date: 08/20/2023  Patient will score at least 2/50 on NDI to signify clinically meaningful improvement in functional abilities.   Baseline: 4/50 Goal status: INITIAL  2.  Patient will acknowledge 2/10 pain at least once during episode of care   Baseline: 3/10 Goal status: INITIAL  3.  Increase cervical AROM to 90% Baseline:  Active ROM A/PROM (deg) eval  Flexion 90%  Extension 90%  Right lateral flexion 75%  Left lateral flexion 50%  Right rotation 75%  Left rotation 90%   Goal status: INITIAL  4.  Increase strength to 4+/5 in deficit regions Baseline:  MMT Right eval Left eval  Shoulder flexion    Shoulder extension    Shoulder abduction    Shoulder adduction    Shoulder extension    Shoulder internal rotation    Shoulder external rotation    Middle trapezius 4 4  Lower trapezius 4 4   Goal status: INITIAL     PLAN:  PT FREQUENCY: 2x/week  PT DURATION: 6 weeks  PLANNED INTERVENTIONS: 97164- PT Re-evaluation, 97110-Therapeutic exercises, 97530- Therapeutic activity, 97112- Neuromuscular re-education, 97535- Self Care, 54098- Manual therapy, Patient/Family education, Dry Needling, Joint mobilization, and Spinal mobilization  PLAN FOR NEXT SESSION:  HEP review and update, manual techniques as appropriate, aerobic tasks, ROM and flexibility activities, strengthening and PREs, TPDN, gait and balance training as needed     Edwina Gram PT  08/10/23 8:28 AM Phone: [phone] Fax: [phone]

## 2023-08-10 ENCOUNTER — Ambulatory Visit: Attending: Family Medicine

## 2023-08-10 DIAGNOSIS — R293 Abnormal posture: Secondary | ICD-10-CM | POA: Diagnosis present

## 2023-08-10 DIAGNOSIS — M6281 Muscle weakness (generalized): Secondary | ICD-10-CM | POA: Insufficient documentation

## 2023-08-10 DIAGNOSIS — M546 Pain in thoracic spine: Secondary | ICD-10-CM | POA: Insufficient documentation

## 2023-08-13 ENCOUNTER — Ambulatory Visit

## 2023-08-13 DIAGNOSIS — M546 Pain in thoracic spine: Secondary | ICD-10-CM

## 2023-08-13 DIAGNOSIS — M6281 Muscle weakness (generalized): Secondary | ICD-10-CM

## 2023-08-13 DIAGNOSIS — R293 Abnormal posture: Secondary | ICD-10-CM

## 2023-08-13 NOTE — Therapy (Signed)
 OUTPATIENT PHYSICAL THERAPY TREATMENT NOTE   Patient Name: Megan Armstrong MRN: [id number] DOB:1965/10/19, 58 y.o., female Today's Date: 08/13/2023  END OF SESSION:  PT End of Session - 08/13/23 0749     Visit Number 8    Number of Visits 12    Date for PT Re-Evaluation 09/08/23    Authorization Type cigna    PT Start Time [phone]    PT Stop Time 0830    PT Time Calculation (min) 40 min    Activity Tolerance Patient tolerated treatment well    Behavior During Therapy WFL for tasks assessed/performed            Past Medical History:  Diagnosis Date   Allergic rhinitis    Endometrial hyperplasia    Endometriosis    Family history of breast cancer    Family history of breast cancer    Hx of endometriosis    rectal vag with laser surgery    Seasonal allergies    TB skin/subcutaneous    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   Past Surgical History:  Procedure Laterality Date   DIAGNOSTIC LAPAROSCOPY     MASTECTOMY W/ SENTINEL NODE BIOPSY Bilateral 09/16/2018   Procedure: RIGHT MASTECTOMY WITH RIGHT AXILLARY SENTINEL LYMPH NODE BIOPSY AND LEFT RISK REDUCING MASTECTOMY;  Surgeon: Enid Harry, MD;  Location: [facility];  Service: General;  Laterality: Bilateral;   Patient Active Problem List   Diagnosis Date Noted   Family history of cardiovascular disorder 09/12/2022   Breast cancer, right (HCC) 09/16/2018   Genetic testing 09/09/2018   Malignant neoplasm of upper-inner quadrant of right breast in female, estrogen receptor positive (HCC) 08/19/2018   Family history of breast cancer    History of penicillin allergy 09/24/2012   Exposure to strep throat 09/24/2012   Perioral dermatitis 09/24/2012   Left otitis media with effusion 08/16/2011   Hx of antibiotic allergy 05/03/2011   Disorder of teeth and supporting structures 03/30/2008   Allergic rhinitis 02/17/2008   Endometriosis 02/17/2008   Headache 02/17/2008   Hx  gestational diabetes 02/17/2008   POSITIVE PPD 12/17/2007    PCP: Reginal Capra, MD   REFERRING PROVIDER: Marquetta Sit, MD  REFERRING DIAG: M54.9 (ICD-10-CM) - Upper back pain   THERAPY DIAG:  Pain in thoracic spine  Muscle weakness (generalized)  Abnormal posture  Rationale for Evaluation and Treatment: Rehabilitation  ONSET DATE: chronic  SUBJECTIVE:  SUBJECTIVE STATEMENT: Mild tingling noted to inferior scapular borders today but no pain.  Was able to garden and perform yoga w/o limitation.   Hand dominance: Right  PERTINENT HISTORY:   upper back pain.  Musculoskeletal.  Has benefited from PT in the past and requesting repeat referral.  Referral is placed.   PAIN:  Are you having pain? Yes: NPRS scale: 0/10 Pain location: B scapular regions/flank Pain description: dull/achy Aggravating factors: bending, twisting Relieving factors: position changes  PRECAUTIONS: None  RED FLAGS: None     WEIGHT BEARING RESTRICTIONS: No  FALLS:  Has patient fallen in last 6 months? No  OCCUPATION: office admin  PLOF: Independent  PATIENT GOALS: To manage my back pain  NEXT MD VISIT: TBD  OBJECTIVE:  Note: Objective measures were completed at Evaluation unless otherwise noted.  DIAGNOSTIC FINDINGS:  none  PATIENT SURVEYS:   ODI: 4/50  POSTURE: rounded shoulders, forward head, and increased thoracic kyphosis  PALPATION: TTP B rhomboids R>L  CERVICAL ROM:   Active ROM A/PROM (deg) eval 08/10/23  Flexion 90% 90%  Extension 90% 90%  Right lateral flexion 75% 75%  Left lateral flexion 50% 75%  Right rotation 75% 90%  Left rotation 90% 90%   (Blank rows = not tested)  UPPER EXTREMITY ROM: WNL B  Active ROM Right eval Left eval  Shoulder flexion     Shoulder extension    Shoulder abduction    Shoulder adduction    Shoulder extension    Shoulder internal rotation    Shoulder external rotation    Elbow flexion    Elbow extension    Wrist flexion    Wrist extension    Wrist ulnar deviation    Wrist radial deviation    Wrist pronation    Wrist supination     (Blank rows = not tested)  UPPER EXTREMITY MMT:  MMT Right eval Left eval B 08/10/23  Shoulder flexion   4(prone)  Shoulder extension     Shoulder abduction     Shoulder adduction     Shoulder extension     Shoulder internal rotation     Shoulder external rotation     Middle trapezius 4 4 4   Lower trapezius 4 4 4   Elbow flexion     Elbow extension     Wrist flexion     Wrist extension     Wrist ulnar deviation     Wrist radial deviation     Wrist pronation     Wrist supination     Grip strength      (Blank rows = not tested)  CERVICAL SPECIAL TESTS:  Neck flexor muscle endurance test: Negative and Spurling's test: Negative  FUNCTIONAL TESTS:  30 seconds chair stand test n/a  TREATMENT DATE:  [facility] Adult PT Treatment:                                                DATE: 08/13/23 Therapeutic Exercise: Nustep L3 8 min Neuromuscular re-ed: Bird dog 10/10 Quadruped thoracic rotation 10/10 Prone flexion 1# 15x Prone extension 1# 15x Prone hor abd 1# 15x Prone Ws 1#15x Prone Ys 1# 15x Therapeutic Activity: Omega high row 25# 15x2 Omega low row 25# 15x2 Omega lat pulldown 25# 15x2  [facility] Adult PT Treatment:  DATE: 08/10/23 Therapeutic Exercise: Nustep L2 8 min Neuromuscular re-ed: Supine OH flexion 1# 15/15 Prone flexion 1# 15x Prone extension 1# 15x Prone hor abd 1# 15x Prone Ws 1#15x Seated OH press 1# 15x Therapeutic Activity: Re-assessment of ROM and function  [facility] Adult PT Treatment:                                                DATE: 07/30/23  Therapeutic Activity: Omega high row 20#  15x2 Omega low row 20# 15x2 Omega lat pulldown 20# 15x2 UBE L1.5 2.5/2.5 min Seated thoracic extension hands b/d head x 10- foam roller in chair S/L upper trunk rotations (open books) x 10 each - top leg extended Supine GTB Horiz abdct x15 Supine scapular retraction(hands b/d head) 10x Supine GTB alternating star pattern x 15     [facility] Adult PT Treatment:                                                DATE: 07/09/22 Eval and HEP Self Care: Additional minutes spent for educating on updated Therapeutic Home Exercise Program as well as comparing current status to condition at start of symptoms. This included exercises focusing on stretching, strengthening, with focus on eccentric aspects. Long term goals include an improvement in range of motion, strength, endurance as well as avoiding reinjury. Patient's frequency would include in 1-2 times a day, 3-5 times a week for a duration of 6-12 weeks. Proper technique shown and discussed handout in great detail. All questions were discussed and addressed.                                                                                                                                 PATIENT EDUCATION:  Education details: Discussed eval findings, rehab rationale and POC and patient is in agreement  Person educated: Patient Education method: Explanation Education comprehension: verbalized understanding and needs further education  HOME EXERCISE PROGRAM: Access Code: [email or web address] URL: [email or web address] Date: 08/13/2023 Prepared by: Gretta Leavens  Exercises - Doorway Pec Stretch at 90 Degrees Abduction  - 2 x daily - 5 x weekly - 1 sets - 3 reps - 30s hold - Quadruped Thoracic Rotation Full Range with Hand on Neck  - 2 x daily - 5 x weekly - 1 sets - 10 reps - Prone Scapular Retraction  - 2 x daily - 5 x weekly - 1 sets - 15 reps - Prone Shoulder Horizontal Abduction  - 2 x daily - 5 x weekly - 1 sets - 15 reps - Prone Single Arm  Shoulder Y  - 2 x daily - 5 x weekly - 1 sets - 15 reps -  Prone Shoulder Extension  - 2 x daily - 5 x weekly - 1 sets - 15 reps - Prone W Scapular Retraction  - 2 x daily - 5 x weekly - 1 sets - 15 reps  ASSESSMENT:  CLINICAL IMPRESSION: Activity tolerance improving.  Still spends a lot of time in flexed thoracic position ie; driving, computer, gardening.  Showing increased posterior shoulder strength as evidenced by increased workload tolerance.  Patient is a 58 y.o. female who was seen today for physical therapy evaluation and treatment for chronic thoracic and flank pain. Patient presents with good shoulder mobility B.  ROM restrictions noted in cervical spine and weakness identified in posterior shoulders including rhomboids and middle traps.  Palpation finds TrPs to B rhomboids R>L.  Postural deficits of elevated and forward shoulder identified.  OBJECTIVE IMPAIRMENTS: decreased activity tolerance, decreased knowledge of condition, decreased strength, increased fascial restrictions, impaired perceived functional ability, postural dysfunction, and pain.   ACTIVITY LIMITATIONS: carrying, lifting, bending, and sitting  PERSONAL FACTORS: Age, Behavior pattern, Fitness, and Past/current experiences are also affecting patient's functional outcome.   REHAB POTENTIAL: Good  CLINICAL DECISION MAKING: Stable/uncomplicated  EVALUATION COMPLEXITY: Low   GOALS: Goals reviewed with patient? No  SHORT TERM GOALS: Target date: 07/30/2023  Patient to demonstrate independence in HEP  Baseline:  [id number] Goal status: MET   LONG TERM GOALS: Target date: 08/20/2023  Patient will score at least 2/50 on NDI to signify clinically meaningful improvement in functional abilities.   Baseline: 4/50 Goal status: INITIAL  2.  Patient will acknowledge 2/10 pain at least once during episode of care   Baseline: 3/10 Goal status: INITIAL  3.  Increase cervical AROM to 90% Baseline:  Active ROM A/PROM  (deg) eval  Flexion 90%  Extension 90%  Right lateral flexion 75%  Left lateral flexion 50%  Right rotation 75%  Left rotation 90%   Goal status: INITIAL  4.  Increase strength to 4+/5 in deficit regions Baseline:  MMT Right eval Left eval  Shoulder flexion    Shoulder extension    Shoulder abduction    Shoulder adduction    Shoulder extension    Shoulder internal rotation    Shoulder external rotation    Middle trapezius 4 4  Lower trapezius 4 4   Goal status: INITIAL     PLAN:  PT FREQUENCY: 2x/week  PT DURATION: 6 weeks  PLANNED INTERVENTIONS: 97164- PT Re-evaluation, 97110-Therapeutic exercises, 97530- Therapeutic activity, 97112- Neuromuscular re-education, 97535- Self Care, 47829- Manual therapy, Patient/Family education, Dry Needling, Joint mobilization, and Spinal mobilization  PLAN FOR NEXT SESSION: HEP review and update, manual techniques as appropriate, aerobic tasks, ROM and flexibility activities, strengthening and PREs, TPDN, gait and balance training as needed     Edwina Gram PT  08/13/23 8:37 AM Phone: [phone] Fax: [phone]

## 2023-08-14 NOTE — Therapy (Signed)
 OUTPATIENT PHYSICAL THERAPY TREATMENT NOTE   Patient Name: Megan Armstrong MRN: [id number] DOB:March 23, 1966, 58 y.o., female Today's Date: 08/17/2023  END OF SESSION:  PT End of Session - 08/17/23 0751     Visit Number 9    Number of Visits 12    Date for PT Re-Evaluation 09/08/23    Authorization Type cigna    PT Start Time [id number]    PT Stop Time 0830    PT Time Calculation (min) 40 min    Activity Tolerance Patient tolerated treatment well    Behavior During Therapy WFL for tasks assessed/performed             Past Medical History:  Diagnosis Date   Allergic rhinitis    Endometrial hyperplasia    Endometriosis    Family history of breast cancer    Family history of breast cancer    Hx of endometriosis    rectal vag with laser surgery    Seasonal allergies    TB skin/subcutaneous    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   Past Surgical History:  Procedure Laterality Date   DIAGNOSTIC LAPAROSCOPY     MASTECTOMY W/ SENTINEL NODE BIOPSY Bilateral 09/16/2018   Procedure: RIGHT MASTECTOMY WITH RIGHT AXILLARY SENTINEL LYMPH NODE BIOPSY AND LEFT RISK REDUCING MASTECTOMY;  Surgeon: Enid Harry, MD;  Location: [facility];  Service: General;  Laterality: Bilateral;   Patient Active Problem List   Diagnosis Date Noted   Family history of cardiovascular disorder 09/12/2022   Breast cancer, right (HCC) 09/16/2018   Genetic testing 09/09/2018   Malignant neoplasm of upper-inner quadrant of right breast in female, estrogen receptor positive (HCC) 08/19/2018   Family history of breast cancer    History of penicillin allergy 09/24/2012   Exposure to strep throat 09/24/2012   Perioral dermatitis 09/24/2012   Left otitis media with effusion 08/16/2011   Hx of antibiotic allergy 05/03/2011   Disorder of teeth and supporting structures 03/30/2008   Allergic rhinitis 02/17/2008   Endometriosis 02/17/2008   Headache 02/17/2008   Hx  gestational diabetes 02/17/2008   POSITIVE PPD 12/17/2007    PCP: Reginal Capra, MD   REFERRING PROVIDER: Marquetta Sit, MD  REFERRING DIAG: M54.9 (ICD-10-CM) - Upper back pain   THERAPY DIAG:  Pain in thoracic spine  Muscle weakness (generalized)  Abnormal posture  Rationale for Evaluation and Treatment: Rehabilitation  ONSET DATE: chronic  SUBJECTIVE:  SUBJECTIVE STATEMENT: Symptoms mild overall.  Daughter is in from college and has not been as compliant with HEP  Hand dominance: Right  PERTINENT HISTORY:   upper back pain.  Musculoskeletal.  Has benefited from PT in the past and requesting repeat referral.  Referral is placed.   PAIN:  Are you having pain? Yes: NPRS scale: 0/10 Pain location: B scapular regions/flank Pain description: dull/achy Aggravating factors: bending, twisting Relieving factors: position changes  PRECAUTIONS: None  RED FLAGS: None     WEIGHT BEARING RESTRICTIONS: No  FALLS:  Has patient fallen in last 6 months? No  OCCUPATION: office admin  PLOF: Independent  PATIENT GOALS: To manage my back pain  NEXT MD VISIT: TBD  OBJECTIVE:  Note: Objective measures were completed at Evaluation unless otherwise noted.  DIAGNOSTIC FINDINGS:  none  PATIENT SURVEYS:   ODI: 4/50  POSTURE: rounded shoulders, forward head, and increased thoracic kyphosis  PALPATION: TTP B rhomboids R>L  CERVICAL ROM:   Active ROM A/PROM (deg) eval 08/10/23  Flexion 90% 90%  Extension 90% 90%  Right lateral flexion 75% 75%  Left lateral flexion 50% 75%  Right rotation 75% 90%  Left rotation 90% 90%   (Blank rows = not tested)  UPPER EXTREMITY ROM: WNL B  Active ROM Right eval Left eval  Shoulder flexion    Shoulder extension    Shoulder  abduction    Shoulder adduction    Shoulder extension    Shoulder internal rotation    Shoulder external rotation    Elbow flexion    Elbow extension    Wrist flexion    Wrist extension    Wrist ulnar deviation    Wrist radial deviation    Wrist pronation    Wrist supination     (Blank rows = not tested)  UPPER EXTREMITY MMT:  MMT Right eval Left eval B 08/10/23  Shoulder flexion   4(prone)  Shoulder extension     Shoulder abduction     Shoulder adduction     Shoulder extension     Shoulder internal rotation     Shoulder external rotation     Middle trapezius 4 4 4   Lower trapezius 4 4 4   Elbow flexion     Elbow extension     Wrist flexion     Wrist extension     Wrist ulnar deviation     Wrist radial deviation     Wrist pronation     Wrist supination     Grip strength      (Blank rows = not tested)  CERVICAL SPECIAL TESTS:  Neck flexor muscle endurance test: Negative and Spurling's test: Negative  FUNCTIONAL TESTS:  30 seconds chair stand test n/a  TREATMENT DATE:  [facility] Adult PT Treatment:                                                DATE: 08/17/23 Therapeutic Exercise: Nustep L4 8 min Neuromuscular re-ed: Bird dog 10/10 Quadruped thoracic rotation 10/10 Prone flexion 1# 15x Prone extension 1# 15x Prone hor abd 1# 15x Prone Ws 1#15x Prone Ys 1# 15x (Physioball used for all prone tasks) Therapeutic Activity: Omega high row 25# 15x2 Omega low row 25# 15x2 Omega lat pulldown 25# 15x2  [facility] Adult PT Treatment:  DATE: 08/13/23 Therapeutic Exercise: Nustep L3 8 min Neuromuscular re-ed: Bird dog 10/10 Quadruped thoracic rotation 10/10 Prone flexion 1# 15x Prone extension 1# 15x Prone hor abd 1# 15x Prone Ws 1#15x Prone Ys 1# 15x Therapeutic Activity: Omega high row 25# 15x2 Omega low row 25# 15x2 Omega lat pulldown 25# 15x2  [facility] Adult PT Treatment:                                                DATE:  08/10/23 Therapeutic Exercise: Nustep L2 8 min Neuromuscular re-ed: Supine OH flexion 1# 15/15 Prone flexion 1# 15x Prone extension 1# 15x Prone hor abd 1# 15x Prone Ws 1#15x Seated OH press 1# 15x Therapeutic Activity: Re-assessment of ROM and function  [facility] Adult PT Treatment:                                                DATE: 07/30/23  Therapeutic Activity: Omega high row 20# 15x2 Omega low row 20# 15x2 Omega lat pulldown 20# 15x2 UBE L1.5 2.5/2.5 min Seated thoracic extension hands b/d head x 10- foam roller in chair S/L upper trunk rotations (open books) x 10 each - top leg extended Supine GTB Horiz abdct x15 Supine scapular retraction(hands b/d head) 10x Supine GTB alternating star pattern x 15     [facility] Adult PT Treatment:                                                DATE: 07/09/22 Eval and HEP Self Care: Additional minutes spent for educating on updated Therapeutic Home Exercise Program as well as comparing current status to condition at start of symptoms. This included exercises focusing on stretching, strengthening, with focus on eccentric aspects. Long term goals include an improvement in range of motion, strength, endurance as well as avoiding reinjury. Patient's frequency would include in 1-2 times a day, 3-5 times a week for a duration of 6-12 weeks. Proper technique shown and discussed handout in great detail. All questions were discussed and addressed.                                                                                                                                 PATIENT EDUCATION:  Education details: Discussed eval findings, rehab rationale and POC and patient is in agreement  Person educated: Patient Education method: Explanation Education comprehension: verbalized understanding and needs further education  HOME EXERCISE PROGRAM: Access Code: [email or web address] URL: [email or web address] Date: 08/13/2023 Prepared by: Gretta Leavens  Exercises - Doorway Pec Stretch at  90 Degrees Abduction  - 2 x daily - 5 x weekly - 1 sets - 3 reps - 30s hold - Quadruped Thoracic Rotation Full Range with Hand on Neck  - 2 x daily - 5 x weekly - 1 sets - 10 reps - Prone Scapular Retraction  - 2 x daily - 5 x weekly - 1 sets - 15 reps - Prone Shoulder Horizontal Abduction  - 2 x daily - 5 x weekly - 1 sets - 15 reps - Prone Single Arm Shoulder Y  - 2 x daily - 5 x weekly - 1 sets - 15 reps - Prone Shoulder Extension  - 2 x daily - 5 x weekly - 1 sets - 15 reps - Prone W Scapular Retraction  - 2 x daily - 5 x weekly - 1 sets - 15 reps  ASSESSMENT:  CLINICAL IMPRESSION: Increased resistance on aerobic work and added physioball for prone exercises.  Continues to feel stretch/resistance at end ranges due to long standing soft tissue restrictions  Patient is a 58 y.o. female who was seen today for physical therapy evaluation and treatment for chronic thoracic and flank pain. Patient presents with good shoulder mobility B.  ROM restrictions noted in cervical spine and weakness identified in posterior shoulders including rhomboids and middle traps.  Palpation finds TrPs to B rhomboids R>L.  Postural deficits of elevated and forward shoulder identified.  OBJECTIVE IMPAIRMENTS: decreased activity tolerance, decreased knowledge of condition, decreased strength, increased fascial restrictions, impaired perceived functional ability, postural dysfunction, and pain.   ACTIVITY LIMITATIONS: carrying, lifting, bending, and sitting  PERSONAL FACTORS: Age, Behavior pattern, Fitness, and Past/current experiences are also affecting patient's functional outcome.   REHAB POTENTIAL: Good  CLINICAL DECISION MAKING: Stable/uncomplicated  EVALUATION COMPLEXITY: Low   GOALS: Goals reviewed with patient? No  SHORT TERM GOALS: Target date: 07/30/2023  Patient to demonstrate independence in HEP  Baseline:  [id number] Goal status: MET   LONG TERM  GOALS: Target date: 08/20/2023  Patient will score at least 2/50 on NDI to signify clinically meaningful improvement in functional abilities.   Baseline: 4/50 Goal status: INITIAL  2.  Patient will acknowledge 2/10 pain at least once during episode of care   Baseline: 3/10 Goal status: INITIAL  3.  Increase cervical AROM to 90% Baseline:  Active ROM A/PROM (deg) eval  Flexion 90%  Extension 90%  Right lateral flexion 75%  Left lateral flexion 50%  Right rotation 75%  Left rotation 90%   Goal status: INITIAL  4.  Increase strength to 4+/5 in deficit regions Baseline:  MMT Right eval Left eval  Shoulder flexion    Shoulder extension    Shoulder abduction    Shoulder adduction    Shoulder extension    Shoulder internal rotation    Shoulder external rotation    Middle trapezius 4 4  Lower trapezius 4 4   Goal status: INITIAL     PLAN:  PT FREQUENCY: 2x/week  PT DURATION: 6 weeks  PLANNED INTERVENTIONS: 97164- PT Re-evaluation, 97110-Therapeutic exercises, 97530- Therapeutic activity, 97112- Neuromuscular re-education, 97535- Self Care, 54098- Manual therapy, Patient/Family education, Dry Needling, Joint mobilization, and Spinal mobilization  PLAN FOR NEXT SESSION: HEP review and update, manual techniques as appropriate, aerobic tasks, ROM and flexibility activities, strengthening and PREs, TPDN, gait and balance training as needed     Edwina Gram PT  08/17/23 9:11 AM Phone: [phone] Fax: [phone]

## 2023-08-17 ENCOUNTER — Ambulatory Visit

## 2023-08-17 DIAGNOSIS — M6281 Muscle weakness (generalized): Secondary | ICD-10-CM

## 2023-08-17 DIAGNOSIS — M546 Pain in thoracic spine: Secondary | ICD-10-CM | POA: Diagnosis not present

## 2023-08-17 DIAGNOSIS — R293 Abnormal posture: Secondary | ICD-10-CM

## 2023-08-17 NOTE — Therapy (Unsigned)
 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   Past Surgical History:  Procedure Laterality Date   DIAGNOSTIC LAPAROSCOPY     MASTECTOMY W/ SENTINEL NODE BIOPSY Bilateral 09/16/2018   Procedure: RIGHT MASTECTOMY WITH RIGHT AXILLARY SENTINEL LYMPH NODE BIOPSY AND LEFT RISK REDUCING MASTECTOMY;  Surgeon: Enid Harry, MD;  Location: [facility];  Service: General;  Laterality: Bilateral;   Patient Active Problem List   Diagnosis Date Noted   Family history of cardiovascular disorder 09/12/2022   Breast cancer, right (HCC) 09/16/2018   Genetic testing 09/09/2018   Malignant neoplasm of upper-inner quadrant of right breast in female, estrogen receptor positive (HCC) 08/19/2018   Family history of breast cancer    History of penicillin allergy 09/24/2012   Exposure to strep throat 09/24/2012   Perioral dermatitis 09/24/2012   Left otitis media with effusion 08/16/2011   Hx of antibiotic allergy 05/03/2011   Disorder of teeth and supporting structures 03/30/2008   Allergic rhinitis 02/17/2008   Endometriosis 02/17/2008   Headache 02/17/2008   Hx gestational diabetes 02/17/2008   POSITIVE PPD 12/17/2007    PCP: Reginal Capra, MD   REFERRING PROVIDER: Marquetta Sit, MD  REFERRING DIAG: M54.9 (ICD-10-CM) - Upper back pain   THERAPY DIAG:  No diagnosis found.  Rationale for Evaluation and Treatment: Rehabilitation  ONSET DATE: chronic  SUBJECTIVE:                                                                                                                                                                                                          SUBJECTIVE STATEMENT: Symptoms mild overall.  Daughter is in from college and has not been as compliant with HEP  Hand dominance: Right  PERTINENT HISTORY:   upper back pain.  Musculoskeletal.  Has benefited from PT in the past and requesting repeat referral.  Referral is placed.   PAIN:  Are you having pain? Yes: NPRS scale: 0/10 Pain location: B scapular regions/flank Pain description: dull/achy Aggravating factors:  bending, twisting Relieving factors: position changes  PRECAUTIONS: None  RED FLAGS: None     WEIGHT BEARING RESTRICTIONS: No  FALLS:  Has patient fallen in last 6 months? No  OCCUPATION: office admin  PLOF: Independent  PATIENT GOALS: To manage my back pain  NEXT MD VISIT: TBD  OBJECTIVE:  Note: Objective measures were completed at Evaluation unless otherwise noted.  DIAGNOSTIC FINDINGS:  none  PATIENT SURVEYS:   ODI: 4/50  POSTURE: rounded shoulders, forward head, and increased thoracic kyphosis  PALPATION: TTP B rhomboids R>L  CERVICAL ROM:   Active ROM A/PROM (deg) eval 08/10/23  Flexion 90% 90%  Extension 90% 90%  Right lateral flexion 75% 75%  Left lateral flexion 50% 75%  Right rotation 75% 90%  Left rotation 90% 90%   (Blank rows = not tested)  UPPER EXTREMITY ROM: WNL B  Active ROM Right eval Left eval  Shoulder flexion    Shoulder extension    Shoulder abduction    Shoulder adduction    Shoulder extension    Shoulder internal rotation    Shoulder external rotation    Elbow flexion    Elbow extension    Wrist flexion    Wrist extension    Wrist ulnar deviation    Wrist radial deviation    Wrist pronation    Wrist supination     (Blank rows = not tested)  UPPER EXTREMITY MMT:  MMT Right eval Left eval B 08/10/23  Shoulder flexion   4(prone)  Shoulder  extension     Shoulder abduction     Shoulder adduction     Shoulder extension     Shoulder internal rotation     Shoulder external rotation     Middle trapezius 4 4 4   Lower trapezius 4 4 4   Elbow flexion     Elbow extension     Wrist flexion     Wrist extension     Wrist ulnar deviation     Wrist radial deviation     Wrist pronation     Wrist supination     Grip strength      (Blank rows = not tested)  CERVICAL SPECIAL TESTS:  Neck flexor muscle endurance test: Negative and Spurling's test: Negative  FUNCTIONAL TESTS:  30 seconds chair stand test n/a  TREATMENT DATE:  [facility] Adult PT Treatment:                                                DATE: 08/17/23 Therapeutic Exercise: Nustep L4 8 min Neuromuscular re-ed: Bird dog 10/10 Quadruped thoracic rotation 10/10 Prone flexion 1# 15x Prone extension 1# 15x Prone hor abd 1# 15x Prone Ws 1#15x Prone Ys 1# 15x (Physioball used for all prone tasks) Therapeutic Activity: Omega high row 25# 15x2 Omega low row 25# 15x2 Omega lat pulldown 25# 15x2  [facility] Adult PT Treatment:                                                DATE: 08/13/23 Therapeutic Exercise: Nustep L3 8 min Neuromuscular re-ed: Bird dog 10/10 Quadruped thoracic rotation 10/10 Prone flexion 1# 15x Prone extension 1# 15x Prone hor abd 1# 15x Prone Ws 1#15x Prone Ys 1#  15x Therapeutic Activity: Omega high row 25# 15x2 Omega low row 25# 15x2 Omega lat pulldown 25# 15x2  [facility] Adult PT Treatment:                                                DATE: 08/10/23 Therapeutic Exercise: Nustep L2 8 min Neuromuscular re-ed: Supine OH flexion 1# 15/15 Prone flexion 1# 15x Prone extension 1# 15x Prone hor abd 1# 15x Prone Ws 1#15x Seated OH press 1# 15x Therapeutic Activity: Re-assessment of ROM and function  [facility] Adult PT Treatment:                                                DATE: 07/30/23  Therapeutic Activity: Omega high row 20# 15x2 Omega low row 20#  15x2 Omega lat pulldown 20# 15x2 UBE L1.5 2.5/2.5 min Seated thoracic extension hands b/d head x 10- foam roller in chair S/L upper trunk rotations (open books) x 10 each - top leg extended Supine GTB Horiz abdct x15 Supine scapular retraction(hands b/d head) 10x Supine GTB alternating star pattern x 15     [facility] Adult PT Treatment:                                                DATE: 07/09/22 Eval and HEP Self Care: Additional minutes spent for educating on updated Therapeutic Home Exercise Program as well as comparing current status to condition at start of symptoms. This included exercises focusing on stretching, strengthening, with focus on eccentric aspects. Long term goals include an improvement in range of motion, strength, endurance as well as avoiding reinjury. Patient's frequency would include in 1-2 times a day, 3-5 times a week for a duration of 6-12 weeks. Proper technique shown and discussed handout in great detail. All questions were discussed and addressed.                                                                                                                                 PATIENT EDUCATION:  Education details: Discussed eval findings, rehab rationale and POC and patient is in agreement  Person educated: Patient Education method: Explanation Education comprehension: verbalized understanding and needs further education  HOME EXERCISE PROGRAM: Access Code: [email or web address] URL: [email or web address] Date: 08/13/2023 Prepared by: Gretta Leavens  Exercises - Doorway Pec Stretch at 90 Degrees Abduction  - 2 x daily - 5 x weekly - 1 sets - 3 reps - 30s hold - Quadruped Thoracic Rotation Full Range with Hand on Neck  - 2 x daily -  5 x weekly - 1 sets - 10 reps - Prone Scapular Retraction  - 2 x daily - 5 x weekly - 1 sets - 15 reps - Prone Shoulder Horizontal Abduction  - 2 x daily - 5 x weekly - 1 sets - 15 reps - Prone Single Arm Shoulder Y  - 2 x daily -  5 x weekly - 1 sets - 15 reps - Prone Shoulder Extension  - 2 x daily - 5 x weekly - 1 sets - 15 reps - Prone W Scapular Retraction  - 2 x daily - 5 x weekly - 1 sets - 15 reps  ASSESSMENT:  CLINICAL IMPRESSION: Increased resistance on aerobic work and added physioball for prone exercises.  Continues to feel stretch/resistance at end ranges due to long standing soft tissue restrictions  Patient is a 58 y.o. female who was seen today for physical therapy evaluation and treatment for chronic thoracic and flank pain. Patient presents with good shoulder mobility B.  ROM restrictions noted in cervical spine and weakness identified in posterior shoulders including rhomboids and middle traps.  Palpation finds TrPs to B rhomboids R>L.  Postural deficits of elevated and forward shoulder identified.  OBJECTIVE IMPAIRMENTS: decreased activity tolerance, decreased knowledge of condition, decreased strength, increased fascial restrictions, impaired perceived functional ability, postural dysfunction, and pain.   ACTIVITY LIMITATIONS: carrying, lifting, bending, and sitting  PERSONAL FACTORS: Age, Behavior pattern, Fitness, and Past/current experiences are also affecting patient's functional outcome.   REHAB POTENTIAL: Good  CLINICAL DECISION MAKING: Stable/uncomplicated  EVALUATION COMPLEXITY: Low   GOALS: Goals reviewed with patient? No  SHORT TERM GOALS: Target date: 07/30/2023  Patient to demonstrate independence in HEP  Baseline:  [id number] Goal status: MET   LONG TERM GOALS: Target date: 08/20/2023  Patient will score at least 2/50 on NDI to signify clinically meaningful improvement in functional abilities.   Baseline: 4/50 Goal status: INITIAL  2.  Patient will acknowledge 2/10 pain at least once during episode of care   Baseline: 3/10 Goal status: INITIAL  3.  Increase cervical AROM to 90% Baseline:  Active ROM A/PROM (deg) eval  Flexion 90%  Extension 90%  Right lateral  flexion 75%  Left lateral flexion 50%  Right rotation 75%  Left rotation 90%   Goal status: INITIAL  4.  Increase strength to 4+/5 in deficit regions Baseline:  MMT Right eval Left eval  Shoulder flexion    Shoulder extension    Shoulder abduction    Shoulder adduction    Shoulder extension    Shoulder internal rotation    Shoulder external rotation    Middle trapezius 4 4  Lower trapezius 4 4   Goal status: INITIAL     PLAN:  PT FREQUENCY: 2x/week  PT DURATION: 6 weeks  PLANNED INTERVENTIONS: 97164- PT Re-evaluation, 97110-Therapeutic exercises, 97530- Therapeutic activity, 97112- Neuromuscular re-education, 97535- Self Care, 84696- Manual therapy, Patient/Family education, Dry Needling, Joint mobilization, and Spinal mobilization  PLAN FOR NEXT SESSION: HEP review and update, manual techniques as appropriate, aerobic tasks, ROM and flexibility activities, strengthening and PREs, TPDN, gait and balance training as needed     Edwina Gram PT  08/17/23 11:58 AM Phone: [phone] Fax: [phone]

## 2023-08-20 ENCOUNTER — Ambulatory Visit

## 2023-08-20 DIAGNOSIS — M546 Pain in thoracic spine: Secondary | ICD-10-CM | POA: Diagnosis not present

## 2023-08-20 DIAGNOSIS — M6281 Muscle weakness (generalized): Secondary | ICD-10-CM

## 2023-08-20 DIAGNOSIS — R293 Abnormal posture: Secondary | ICD-10-CM

## 2023-08-24 ENCOUNTER — Ambulatory Visit

## 2023-08-27 ENCOUNTER — Ambulatory Visit

## 2023-08-27 NOTE — Therapy (Deleted)
 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   Past Surgical History:  Procedure Laterality Date   DIAGNOSTIC LAPAROSCOPY     MASTECTOMY W/ SENTINEL NODE BIOPSY Bilateral 09/16/2018   Procedure: RIGHT MASTECTOMY WITH RIGHT AXILLARY SENTINEL LYMPH NODE BIOPSY AND LEFT RISK REDUCING MASTECTOMY;  Surgeon: Enid Harry, MD;  Location: [facility];  Service: General;  Laterality: Bilateral;   Patient Active Problem List   Diagnosis Date Noted   Family history of cardiovascular disorder 09/12/2022   Breast cancer, right (HCC) 09/16/2018   Genetic testing 09/09/2018   Malignant neoplasm of upper-inner quadrant of right breast in female, estrogen receptor positive (HCC) 08/19/2018   Family history of breast cancer    History of penicillin allergy 09/24/2012   Exposure to strep throat 09/24/2012   Perioral dermatitis 09/24/2012   Left otitis media with effusion 08/16/2011   Hx of antibiotic allergy 05/03/2011   Disorder of teeth and supporting structures 03/30/2008   Allergic rhinitis 02/17/2008   Endometriosis 02/17/2008   Headache 02/17/2008   Hx gestational diabetes 02/17/2008   POSITIVE PPD 12/17/2007    PCP: Reginal Capra, MD   REFERRING PROVIDER: Marquetta Sit, MD  REFERRING DIAG: M54.9 (ICD-10-CM) - Upper back pain   THERAPY DIAG:  No diagnosis found.  Rationale for Evaluation and Treatment: Rehabilitation  ONSET DATE: chronic  SUBJECTIVE:                                                                                                                                                                                                          SUBJECTIVE STATEMENT: Symptoms persist with certain driving positions and noted on lat pulldown machine  Hand dominance: Right  PERTINENT HISTORY:   upper back pain.  Musculoskeletal.  Has benefited from PT in the past and requesting repeat referral.  Referral is placed.   PAIN:  Are you having pain? Yes: NPRS scale: 0/10 Pain location: B scapular regions/flank Pain description: dull/achy Aggravating factors: bending, twisting Relieving factors:  position changes  PRECAUTIONS: None  RED FLAGS: None     WEIGHT BEARING RESTRICTIONS: No  FALLS:  Has patient fallen in last 6 months? No  OCCUPATION: office admin  PLOF: Independent  PATIENT GOALS: To manage my back pain  NEXT MD VISIT: TBD  OBJECTIVE:  Note: Objective measures were completed at Evaluation unless otherwise noted.  DIAGNOSTIC FINDINGS:  none  PATIENT SURVEYS:   ODI: 4/50  POSTURE: rounded shoulders, forward head, and increased thoracic kyphosis  PALPATION: TTP B rhomboids R>L  CERVICAL ROM:   Active ROM A/PROM (deg) eval 08/10/23  Flexion 90% 90%  Extension 90% 90%  Right lateral flexion 75% 75%  Left lateral flexion 50% 75%  Right rotation 75% 90%  Left rotation 90% 90%   (Blank rows = not tested)  UPPER EXTREMITY ROM: WNL B  Active ROM Right eval Left eval  Shoulder flexion    Shoulder extension    Shoulder abduction    Shoulder adduction    Shoulder extension    Shoulder internal rotation    Shoulder external rotation    Elbow flexion    Elbow extension    Wrist flexion    Wrist extension    Wrist ulnar deviation    Wrist radial deviation    Wrist pronation    Wrist supination     (Blank rows = not tested)  UPPER EXTREMITY MMT:  MMT Right eval Left eval B 08/10/23  Shoulder flexion   4(prone)  Shoulder  extension     Shoulder abduction     Shoulder adduction     Shoulder extension     Shoulder internal rotation     Shoulder external rotation     Middle trapezius 4 4 4   Lower trapezius 4 4 4   Elbow flexion     Elbow extension     Wrist flexion     Wrist extension     Wrist ulnar deviation     Wrist radial deviation     Wrist pronation     Wrist supination     Grip strength      (Blank rows = not tested)  CERVICAL SPECIAL TESTS:  Neck flexor muscle endurance test: Negative and Spurling's test: Negative  FUNCTIONAL TESTS:  30 seconds chair stand test n/a  TREATMENT DATE:  [facility] Adult PT Treatment:                                                DATE: 08/20/23 Therapeutic Exercise: Nustep L4 8 min Neuromuscular re-ed: Bird dog 10/10 Quadruped thoracic rotation 10/10 Prone flexion 2# 15x Prone extension 2# 15x Prone hor abd 2# 15x Prone Ws 1#15x Prone Ys 1# 15x (Physioball used for all prone tasks) Therapeutic Activity: Omega high row 25# 15x2 Omega low row 25# 15x2 Omega lat pulldown 25# 15x2  [facility] Adult PT Treatment:                                                DATE: 08/17/23 Therapeutic Exercise: Nustep L4 8 min Neuromuscular re-ed: Bird dog 10/10 Quadruped thoracic rotation 10/10 Prone flexion 1# 15x Prone extension 1# 15x Prone hor abd 1# 15x Prone Ws 1#15x Prone Ys 1# 15x (Physioball used for  all prone tasks) Therapeutic Activity: Omega high row 25# 15x2 Omega low row 25# 15x2 Omega lat pulldown 25# 15x2  [facility] Adult PT Treatment:                                                DATE: 08/13/23 Therapeutic Exercise: Nustep L3 8 min Neuromuscular re-ed: Bird dog 10/10 Quadruped thoracic rotation 10/10 Prone flexion 1# 15x Prone extension 1# 15x Prone hor abd 1# 15x Prone Ws 1#15x Prone Ys 1# 15x Therapeutic Activity: Omega high row 25# 15x2 Omega low row 25# 15x2 Omega lat pulldown 25# 15x2  [facility] Adult PT Treatment:                                                 DATE: 08/10/23 Therapeutic Exercise: Nustep L2 8 min Neuromuscular re-ed: Supine OH flexion 1# 15/15 Prone flexion 1# 15x Prone extension 1# 15x Prone hor abd 1# 15x Prone Ws 1#15x Seated OH press 1# 15x Therapeutic Activity: Re-assessment of ROM and function  [facility] Adult PT Treatment:                                                DATE: 07/30/23  Therapeutic Activity: Omega high row 20# 15x2 Omega low row 20# 15x2 Omega lat pulldown 20# 15x2 UBE L1.5 2.5/2.5 min Seated thoracic extension hands b/d head x 10- foam roller in chair S/L upper trunk rotations (open books) x 10 each - top leg extended Supine GTB Horiz abdct x15 Supine scapular retraction(hands b/d head) 10x Supine GTB alternating star pattern x 15     [facility] Adult PT Treatment:                                                DATE: 07/09/22 Eval and HEP Self Care: Additional minutes spent for educating on updated Therapeutic Home Exercise Program as well as comparing current status to condition at start of symptoms. This included exercises focusing on stretching, strengthening, with focus on eccentric aspects. Long term goals include an improvement in range of motion, strength, endurance as well as avoiding reinjury. Patient's frequency would include in 1-2 times a day, 3-5 times a week for a duration of 6-12 weeks. Proper technique shown and discussed handout in great detail. All questions were discussed and addressed.  PATIENT EDUCATION:  Education details: Discussed eval findings, rehab rationale and POC and patient is in agreement  Person educated: Patient Education method: Explanation Education comprehension: verbalized understanding and needs further education  HOME EXERCISE PROGRAM: Access Code: [email or web address] URL: [email or web address] Date: 08/20/2023 Prepared  by: Gretta Leavens  Exercises - Quadruped Thoracic Rotation Full Range with Hand on Neck  - 2 x daily - 5 x weekly - 1 sets - 10 reps - Prone Scapular Retraction  - 2 x daily - 5 x weekly - 1 sets - 15 reps - Prone Shoulder Horizontal Abduction  - 2 x daily - 5 x weekly - 1 sets - 15 reps - Prone Single Arm Shoulder Y  - 2 x daily - 5 x weekly - 1 sets - 15 reps - Prone Shoulder Extension  - 2 x daily - 5 x weekly - 1 sets - 15 reps - Prone W Scapular Retraction  - 2 x daily - 5 x weekly - 1 sets - 15 reps - Seated Lat Stretch  - 2 x daily - 5 x weekly - 1 sets - 5 reps - Latissimus Dorsi Stretch at Wall  - 2 x daily - 5 x weekly - 1 sets - 2 reps - 30s hold  ASSESSMENT:  CLINICAL IMPRESSION: Maintained same resistance on aerobic task as it remains a challenge to patient.  Added additional latissimus stretching tasks based on subjective complaints and objective findings.  Advanced to 2# weights for prone exercises.  Patient is a 58 y.o. female who was seen today for physical therapy evaluation and treatment for chronic thoracic and flank pain. Patient presents with good shoulder mobility B.  ROM restrictions noted in cervical spine and weakness identified in posterior shoulders including rhomboids and middle traps.  Palpation finds TrPs to B rhomboids R>L.  Postural deficits of elevated and forward shoulder identified.  OBJECTIVE IMPAIRMENTS: decreased activity tolerance, decreased knowledge of condition, decreased strength, increased fascial restrictions, impaired perceived functional ability, postural dysfunction, and pain.   ACTIVITY LIMITATIONS: carrying, lifting, bending, and sitting  PERSONAL FACTORS: Age, Behavior pattern, Fitness, and Past/current experiences are also affecting patient's functional outcome.   REHAB POTENTIAL: Good  CLINICAL DECISION MAKING: Stable/uncomplicated  EVALUATION COMPLEXITY: Low   GOALS: Goals reviewed with patient? No  SHORT TERM GOALS: Target  date: 07/30/2023  Patient to demonstrate independence in HEP  Baseline:  [id number] Goal status: MET   LONG TERM GOALS: Target date: 08/20/2023  Patient will score at least 2/50 on NDI to signify clinically meaningful improvement in functional abilities.   Baseline: 4/50 Goal status: INITIAL  2.  Patient will acknowledge 2/10 pain at least once during episode of care   Baseline: 3/10 Goal status: INITIAL  3.  Increase cervical AROM to 90% Baseline:  Active ROM A/PROM (deg) eval  Flexion 90%  Extension 90%  Right lateral flexion 75%  Left lateral flexion 50%  Right rotation 75%  Left rotation 90%   Goal status: INITIAL  4.  Increase strength to 4+/5 in deficit regions Baseline:  MMT Right eval Left eval  Shoulder flexion    Shoulder extension    Shoulder abduction    Shoulder adduction    Shoulder extension    Shoulder internal rotation    Shoulder external rotation    Middle trapezius 4 4  Lower trapezius 4 4   Goal status: INITIAL     PLAN:  PT FREQUENCY: 2x/week  PT DURATION: 6 weeks  PLANNED INTERVENTIONS: 14782- PT Re-evaluation,  97110-Therapeutic exercises, 97530- Therapeutic activity, 97112- Neuromuscular re-education, 97535- Self Care, [phone]- Manual therapy, Patient/Family education, Dry Needling, Joint mobilization, and Spinal mobilization  PLAN FOR NEXT SESSION: HEP review and update, manual techniques as appropriate, aerobic tasks, ROM and flexibility activities, strengthening and PREs, TPDN, gait and balance training as needed     Edwina Gram PT  08/27/23 8:22 AM Phone: [phone] Fax: [phone]

## 2023-08-28 ENCOUNTER — Ambulatory Visit

## 2023-08-28 NOTE — Progress Notes (Signed)
 Chief Complaint  Patient presents with   Annual Exam    Pt reports she was seen with Dr. Darren Em for chest pain and also their practice in nov[place] for chest pain. Had CT scan. Pt also states blood work done with Dr. Harless Lien.     HPI: Patient  Nihira Puello  58 y.o. comes in today for Preventive Health Care visit  Had eval for atypical cp  3 25 lasted about 1 hour  only lab off d dimer and  lipids   Had scan chest angio neg for  PE and  has had cact 0 in 2024   Lipids and heart disease run in family. On tamoxifen  still   had a period began today  last  period last October and had neg evaluation   .  Is perimenopausal instead of menopausal  Dr needling very helpful for upper back area pain   Health Maintenance  Topic Date Due   HIV Screening  Never done   Zoster Vaccines- Shingrix (2 of 2) 06/20/2021   COVID-19 Vaccine (6 - 2024-25 season) 12/10/2022   INFLUENZA VACCINE  11/09/2023   Cervical Cancer Screening (HPV/Pap Cotest)  06/30/2026   DTaP/Tdap/Td (6 - Td or Tdap) 12/29/2030   Colonoscopy  02/21/2032   Hepatitis C Screening  Completed   HPV VACCINES  Aged Out   Meningococcal B Vaccine  Aged Out   Health Maintenance Review LIFESTYLE:  Exercise:    shoulder and back PT   and yoga  Tobacco/ETS:n Alcohol: n Sugar beverages: fruit juice  Sleep: 6- 7  Drug use: no HH of 2 + 2  Work:  8+ per day .    ROS:  GEN/ HEENT: No fever, significant weight changes sweats headaches vision problems hearing changes, CV/ PULM; No chest pain shortness of breath cough, syncope,edema  change in exercise tolerance. GI /GU: No adominal pain, vomiting, change in bowel habits. No blood in the stool. No significant GU symptoms. SKIN/HEME: ,no acute skin rashes suspicious lesions or bleeding. No lymphadenopathy, nodules, masses.  NEURO/ PSYCH:  No neurologic signs such as weakness numbness. No depression anxiety. IMM/ Allergy: No unusual infections.  Allergy .   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    Past Surgical History:  Procedure Laterality Date   DIAGNOSTIC LAPAROSCOPY     MASTECTOMY W/ SENTINEL NODE BIOPSY Bilateral 09/16/2018   Procedure: RIGHT MASTECTOMY WITH RIGHT AXILLARY SENTINEL LYMPH NODE BIOPSY AND LEFT RISK REDUCING MASTECTOMY;  Surgeon: Enid Harry, MD;  Location: [facility];  Service: General;  Laterality: Bilateral;    Family History  Problem Relation Age of Onset   Migraines Sister    Asthma Other    Diabetes Other    Hypertension Other    Sudden death Other    Breast cancer Cousin 50       mat first cousin, BRCA pos    Social History   Socioeconomic History   Marital status: Married    Spouse name: Not on file   Number of  children: Not on file   Years of education: Not on file   Highest education level: Not on file  Occupational History   Not on file  Tobacco Use   Smoking status: Never   Smokeless tobacco: Never  Vaping Use   Vaping status: Never Used  Substance and Sexual Activity   Alcohol use: No   Drug use: No   Sexual activity: Not on file  Other Topics Concern   Not on file  Social History Narrative   Borderline spirometry   Married   Regular exercise   decff tea, some coke   2 children at home hh of 4    Pharmacy school internship   Family form [place]   Social Drivers of Health   Financial Resource Strain: Low Risk  (08/02/2022)   Overall Financial Resource Strain (CARDIA)    Difficulty of Paying Living Expenses: Not hard at all  Food Insecurity: No Food Insecurity (08/02/2022)   Hunger Vital Sign    Worried About Running  Out of Food in the Last Year: Never true    Ran Out of Food in the Last Year: Never true  Transportation Needs: No Transportation Needs (08/02/2022)   PRAPARE - Administrator, Civil Service (Medical): No    Lack of Transportation (Non-Medical): No  Physical Activity: Insufficiently Active (08/02/2022)   Exercise Vital Sign    Days of Exercise per Week: 4 days    Minutes of Exercise per Session: 30 min  Stress: No Stress Concern Present (08/02/2022)   [employer] of Occupational Health - Occupational Stress Questionnaire    Feeling of Stress : Not at all  Social Connections: Socially Integrated (08/02/2022)   Social Connection and Isolation Panel [NHANES]    Frequency of Communication with Friends and Family: More than three times a week    Frequency of Social Gatherings with Friends and Family: Once a week    Attends Religious Services: More than 4 times per year    Active Member of [employer] or Organizations: Yes    Attends Engineer, structural: More than 4 times per year    Marital Status: Married    Outpatient Medications Prior to Visit  Medication Sig Dispense Refill   Multiple Vitamins-Minerals (CENTRUM ULTRA WOMENS PO) Take by mouth.     Oxymetazoline HCl ([employer] NASAL SPRAY NA) Place into the nose as needed.     tamoxifen  (NOLVADEX ) 20 MG tablet TAKE 1 TABLET(20 MG) BY MOUTH DAILY 90 tablet 3   vitamin C (ASCORBIC ACID) 250 MG tablet Take 250 mg by mouth daily.     No facility-administered medications prior to visit.     EXAM:  BP 118/70 (BP Location: Left Arm, Patient Position: Sitting, Cuff Size: Normal)   Pulse 76   Temp 98.4 F (36.9 C) (Oral)   Ht 5' 2.2" (1.58 m)   Wt 121 lb 6.4 oz (55.1 kg)   LMP 08/29/2023   SpO2 97%   BMI 22.06 kg/m   Body mass index is 22.06 kg/m. Wt Readings from Last 3 Encounters:  08/29/23 121 lb 6.4 oz (55.1 kg)  06/29/23 124 lb 1.6 oz (56.3 kg)  03/29/23 122 lb 8 oz (55.6 kg)    Physical Exam: Vital signs  reviewed [email or web address] is a well-developed well-nourished alert cooperative    who appearsr stated age in no acute distress.  HEENT: normocephalic atraumatic , Eyes: PERRL EOM's full, conjunctiva clear, Nares: paten,t no deformity discharge or tenderness., Ears: no deformity  EAC's clear TMs with normal landmarks. Mouth: clear OP, no lesions, edema.  Moist mucous membranes. Dentition in adequate repair. NECK: supple without masses, thyromegaly or bruits. CHEST/PULM:  Clear to auscultation and percussion breath sounds equal no wheeze , rales or rhonchi. Breast: absent surgical removal . CV: PMI is nondisplaced, S1 S2 no gallops, murmurs, rubs. Peripheral pulses are full without delay.No JVD .  ABDOMEN: Bowel sounds normal nontender  No guard or rebound, no hepato splenomegal no CVA tenderness.  Extremtities:  No clubbing cyanosis or edema, no acute joint swelling or redness no focal atrophy NEURO:  Oriented x3, cranial nerves 3-12 appear to be intact, no obvious focal weakness,gait within normal limits no abnormal reflexes or asymmetrical SKIN: No acute rashes normal turgor, color, no bruising or petechiae. PSYCH: Oriented, good eye contact, no obvious depression anxiety, cognition and judgment appear normal. LN: no cervical axillary adenopathy  Lab Results  Component Value Date   WBC 5.8 04/25/2021   HGB 12.6 04/25/2021   HCT 38.8 04/25/2021   PLT 273.0 04/25/2021   GLUCOSE 91 04/25/2021   CHOL 196 04/25/2021   TRIG 221.0 (H) 04/25/2021   HDL 41.50 04/25/2021   LDLDIRECT 111.0 04/25/2021   LDLCALC 90 01/08/2020   ALT 11 04/25/2021   AST 14 04/25/2021   NA 139 04/25/2021   K 4.4 04/25/2021   CL 106 04/25/2021   CREATININE 0.90 04/03/2023   BUN 11 04/25/2021   CO2 27 04/25/2021   TSH 2.50 04/25/2021   HGBA1C 5.3 08/29/2023    BP Readings from Last 3 Encounters:  08/29/23 118/70  06/29/23 120/60  03/29/23 125/60   Lab results reviewed with patient  TC 198 tg 174 hdl 47 ldl 122  Cr  0.75 chem liver cbc normal d dimer was 1.03 vit d 30 Bg 93   ASSESSMENT AND PLAN:  Discussed the following assessment and plan:    ICD-10-CM   1. Visit for preventive health examination  Z00.00 Lipid panel    TSH    2. Mixed hyperlipidemia  E78.2 Lipid panel    TSH    3. Hx gestational diabetes  Z86.32 POC HgB A1c    4. H/O bilateral mastectomy  Z90.13     5. Malignant neoplasm of upper-inner quadrant of right breast in female, estrogen receptor positive (HCC)  C50.211    Z17.0     6. Personal history of tamoxifen  therapy  Z92.29     Dyslipidemia but A1c  ok  Follow  repeat in future with tsh  Order given for lipi panel and tsh  for quest  and see results  Advise talk or notify her gyne that she is having another period   Consider pneumococcal  vaccine 60 +  No follow-ups on file.  Patient Care Team: Elihu Milstein, Joaquim Muir, MD as PCP - General Meriam Stamp, MD as Consulting Physician (Obstetrics and Gynecology) Tami Falcon, MD as Consulting Physician (Gastroenterology) Cameron Cea, MD as Consulting Physician (Hematology and Oncology) Alger Infield, MD as Consulting Physician (Plastic Surgery) Enid Harry, MD as Consulting Physician (General Surgery) Patient Instructions  Lipid tsh in future .  Vicente Weidler K. Sky Borboa M.D.

## 2023-08-29 ENCOUNTER — Encounter: Payer: Self-pay | Admitting: Internal Medicine

## 2023-08-29 ENCOUNTER — Ambulatory Visit (INDEPENDENT_AMBULATORY_CARE_PROVIDER_SITE_OTHER): Admitting: Internal Medicine

## 2023-08-29 VITALS — BP 118/70 | HR 76 | Temp 98.4°F | Ht 62.2 in | Wt 121.4 lb

## 2023-08-29 DIAGNOSIS — Z9229 Personal history of other drug therapy: Secondary | ICD-10-CM

## 2023-08-29 DIAGNOSIS — E782 Mixed hyperlipidemia: Secondary | ICD-10-CM

## 2023-08-29 DIAGNOSIS — Z8632 Personal history of gestational diabetes: Secondary | ICD-10-CM | POA: Diagnosis not present

## 2023-08-29 DIAGNOSIS — Z0000 Encounter for general adult medical examination without abnormal findings: Secondary | ICD-10-CM

## 2023-08-29 DIAGNOSIS — Z9013 Acquired absence of bilateral breasts and nipples: Secondary | ICD-10-CM

## 2023-08-29 DIAGNOSIS — Z170 Estrogen receptor positive status [ER+]: Secondary | ICD-10-CM

## 2023-08-29 DIAGNOSIS — C50211 Malignant neoplasm of upper-inner quadrant of right female breast: Secondary | ICD-10-CM

## 2023-08-29 LAB — POCT GLYCOSYLATED HEMOGLOBIN (HGB A1C): Hemoglobin A1C: 5.3 % (ref 4.0–5.6)

## 2023-08-29 NOTE — Patient Instructions (Signed)
 Lipid tsh in future .

## 2023-08-31 ENCOUNTER — Encounter: Payer: Self-pay | Admitting: Internal Medicine

## 2023-08-31 ENCOUNTER — Ambulatory Visit

## 2023-08-31 NOTE — Progress Notes (Unsigned)
[facility]

## 2023-09-05 NOTE — Therapy (Signed)
 OUTPATIENT PHYSICAL THERAPY TREATMENT NOTE   Patient Name: Megan Armstrong MRN: [id number] DOB:Sep 08, 1965, 58 y.o., female Today's Date: 09/10/2023  END OF SESSION:  PT End of Session - 09/10/23 0832     Visit Number 11    Number of Visits 12    Date for PT Re-Evaluation 09/08/23    Authorization Type cigna    PT Start Time 0830    PT Stop Time 0915    PT Time Calculation (min) 45 min    Activity Tolerance Patient tolerated treatment well    Behavior During Therapy WFL for tasks assessed/performed               Past Medical History:  Diagnosis Date   Allergic rhinitis    Endometrial hyperplasia    Endometriosis    Family history of breast cancer    Family history of breast cancer    Hx of endometriosis    rectal vag with laser surgery    Seasonal allergies    TB skin/subcutaneous    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   Past Surgical History:  Procedure Laterality Date   DIAGNOSTIC LAPAROSCOPY     MASTECTOMY W/ SENTINEL NODE BIOPSY Bilateral 09/16/2018   Procedure: RIGHT MASTECTOMY WITH RIGHT AXILLARY SENTINEL LYMPH NODE BIOPSY AND LEFT RISK REDUCING MASTECTOMY;  Surgeon: Enid Harry, MD;  Location: [facility];  Service: General;  Laterality: Bilateral;   Patient Active Problem List   Diagnosis Date Noted   Family history of cardiovascular disorder 09/12/2022   Breast cancer, right (HCC) 09/16/2018   Genetic testing 09/09/2018   Malignant neoplasm of upper-inner quadrant of right breast in female, estrogen receptor positive (HCC) 08/19/2018   Family history of breast cancer    History of penicillin allergy 09/24/2012   Exposure to strep throat 09/24/2012   Perioral dermatitis 09/24/2012   Left otitis media with effusion 08/16/2011   Hx of antibiotic allergy 05/03/2011   Disorder of teeth and supporting structures 03/30/2008   Allergic rhinitis 02/17/2008   Endometriosis 02/17/2008   Headache 02/17/2008   Hx  gestational diabetes 02/17/2008   POSITIVE PPD 12/17/2007    PCP: Reginal Capra, MD   REFERRING PROVIDER: Marquetta Sit, MD  REFERRING DIAG: M54.9 (ICD-10-CM) - Upper back pain   THERAPY DIAG:  Pain in thoracic spine  Muscle weakness (generalized)  Abnormal posture  Rationale for Evaluation and Treatment: Rehabilitation  ONSET DATE: chronic  SUBJECTIVE:  SUBJECTIVE STATEMENT: Feels she has some minor twinges but continues to note symptoms.   Hand dominance: Right  PERTINENT HISTORY:   upper back pain.  Musculoskeletal.  Has benefited from PT in the past and requesting repeat referral.  Referral is placed.   PAIN:  Are you having pain? Yes: NPRS scale: 0/10 Pain location: B scapular regions/flank Pain description: dull/achy Aggravating factors: bending, twisting Relieving factors: position changes  PRECAUTIONS: None  RED FLAGS: None     WEIGHT BEARING RESTRICTIONS: No  FALLS:  Has patient fallen in last 6 months? No  OCCUPATION: office admin  PLOF: Independent  PATIENT GOALS: To manage my back pain  NEXT MD VISIT: TBD  OBJECTIVE:  Note: Objective measures were completed at Evaluation unless otherwise noted.  DIAGNOSTIC FINDINGS:  none  PATIENT SURVEYS:   ODI: 4/50  POSTURE: rounded shoulders, forward head, and increased thoracic kyphosis  PALPATION: TTP B rhomboids R>L  CERVICAL ROM:   Active ROM A/PROM (deg) eval 08/10/23 09/10/23  Flexion 90% 90% 90%  Extension 90% 90% 90%  Right lateral flexion 75% 75% 50%  Left lateral flexion 50% 75% 50%  Right rotation 75% 90% 90%  Left rotation 90% 90% 75%   (Blank rows = not tested)  UPPER EXTREMITY ROM: WNL B  Active ROM Right eval Left eval  Shoulder flexion    Shoulder extension     Shoulder abduction    Shoulder adduction    Shoulder extension    Shoulder internal rotation    Shoulder external rotation    Elbow flexion    Elbow extension    Wrist flexion    Wrist extension    Wrist ulnar deviation    Wrist radial deviation    Wrist pronation    Wrist supination     (Blank rows = not tested)  UPPER EXTREMITY MMT:  MMT Right eval Left eval B 08/10/23 B 09/10/23  Shoulder flexion   4(prone) 4+  Shoulder extension      Shoulder abduction      Shoulder adduction      Shoulder extension      Shoulder internal rotation      Shoulder external rotation      Middle trapezius 4 4 4  4+  Lower trapezius 4 4 4  4+  Elbow flexion      Elbow extension      Wrist flexion      Wrist extension      Wrist ulnar deviation      Wrist radial deviation      Wrist pronation      Wrist supination      Grip strength       (Blank rows = not tested)  CERVICAL SPECIAL TESTS:  Neck flexor muscle endurance test: Negative and Spurling's test: Negative  FUNCTIONAL TESTS:  30 seconds chair stand test n/a  TREATMENT DATE:  [facility] Adult PT Treatment:                                                DATE: 09/10/23 Therapeutic Exercise: UBE L2 3/3/ min Self Care: Assessment of goal progress and recommendations for continued care outside of clinic.  Patient will be fitted for posture conforming bra following B mastectomies which she should have been wearing post-op.   [facility] Adult PT Treatment:  DATE: 08/20/23 Therapeutic Exercise: Nustep L4 8 min Neuromuscular re-ed: Bird dog 10/10 Quadruped thoracic rotation 10/10 Prone flexion 2# 15x Prone extension 2# 15x Prone hor abd 2# 15x Prone Ws 1#15x Prone Ys 1# 15x (Physioball used for all prone tasks) Therapeutic Activity: Omega high row 25# 15x2 Omega low row 25# 15x2 Omega lat pulldown 25# 15x2  [facility] Adult PT Treatment:                                                DATE:  08/17/23 Therapeutic Exercise: Nustep L4 8 min Neuromuscular re-ed: Bird dog 10/10 Quadruped thoracic rotation 10/10 Prone flexion 1# 15x Prone extension 1# 15x Prone hor abd 1# 15x Prone Ws 1#15x Prone Ys 1# 15x (Physioball used for all prone tasks) Therapeutic Activity: Omega high row 25# 15x2 Omega low row 25# 15x2 Omega lat pulldown 25# 15x2  [facility] Adult PT Treatment:                                                DATE: 08/13/23 Therapeutic Exercise: Nustep L3 8 min Neuromuscular re-ed: Bird dog 10/10 Quadruped thoracic rotation 10/10 Prone flexion 1# 15x Prone extension 1# 15x Prone hor abd 1# 15x Prone Ws 1#15x Prone Ys 1# 15x Therapeutic Activity: Omega high row 25# 15x2 Omega low row 25# 15x2 Omega lat pulldown 25# 15x2     [facility] Adult PT Treatment:                                                DATE: 07/09/22 Eval and HEP Self Care: Additional minutes spent for educating on updated Therapeutic Home Exercise Program as well as comparing current status to condition at start of symptoms. This included exercises focusing on stretching, strengthening, with focus on eccentric aspects. Long term goals include an improvement in range of motion, strength, endurance as well as avoiding reinjury. Patient's frequency would include in 1-2 times a day, 3-5 times a week for a duration of 6-12 weeks. Proper technique shown and discussed handout in great detail. All questions were discussed and addressed.                                                                                                                                 PATIENT EDUCATION:  Education details: Discussed eval findings, rehab rationale and POC and patient is in agreement  Person educated: Patient Education method: Explanation Education comprehension: verbalized understanding and needs further education  HOME EXERCISE PROGRAM: Access Code: [email or web address] URL: [email or web address] Date:  08/20/2023 Prepared  by: Gretta Leavens  Exercises - Quadruped Thoracic Rotation Full Range with Hand on Neck  - 2 x daily - 5 x weekly - 1 sets - 10 reps - Prone Scapular Retraction  - 2 x daily - 5 x weekly - 1 sets - 15 reps - Prone Shoulder Horizontal Abduction  - 2 x daily - 5 x weekly - 1 sets - 15 reps - Prone Single Arm Shoulder Y  - 2 x daily - 5 x weekly - 1 sets - 15 reps - Prone Shoulder Extension  - 2 x daily - 5 x weekly - 1 sets - 15 reps - Prone W Scapular Retraction  - 2 x daily - 5 x weekly - 1 sets - 15 reps - Seated Lat Stretch  - 2 x daily - 5 x weekly - 1 sets - 5 reps - Latissimus Dorsi Stretch at Wall  - 2 x daily - 5 x weekly - 1 sets - 2 reps - 30s hold  ASSESSMENT:  CLINICAL IMPRESSION: Patient seen for re-assessment of progress towards goals.  Some regression in ROM but pain resolved.  She will undergo fitting for post mastectomy prosthesis which she should have been wearing all along in an attempt to correct posture.  Patient is a 58 y.o. female who was seen today for physical therapy evaluation and treatment for chronic thoracic and flank pain. Patient presents with good shoulder mobility B.  ROM restrictions noted in cervical spine and weakness identified in posterior shoulders including rhomboids and middle traps.  Palpation finds TrPs to B rhomboids R>L.  Postural deficits of elevated and forward shoulder identified.  OBJECTIVE IMPAIRMENTS: decreased activity tolerance, decreased knowledge of condition, decreased strength, increased fascial restrictions, impaired perceived functional ability, postural dysfunction, and pain.   ACTIVITY LIMITATIONS: carrying, lifting, bending, and sitting  PERSONAL FACTORS: Age, Behavior pattern, Fitness, and Past/current experiences are also affecting patient's functional outcome.   REHAB POTENTIAL: Good  CLINICAL DECISION MAKING: Stable/uncomplicated  EVALUATION COMPLEXITY: Low   GOALS: Goals reviewed with patient?  No  SHORT TERM GOALS: Target date: 07/30/2023  Patient to demonstrate independence in HEP  Baseline:  [id number] Goal status: MET   LONG TERM GOALS: Target date: 08/20/2023  Patient will score at least 2/50 on NDI to signify clinically meaningful improvement in functional abilities.   Baseline: 4/50; 09/10/23 0/10 Goal status: INITIAL  2.  Patient will acknowledge 2/10 pain at least once during episode of care   Baseline: 3/10; 09/10/23 0/10 Goal status: INITIAL  3.  Increase cervical AROM to 90% Baseline:  Active ROM A/PROM (deg) eval 08/10/23 09/10/23  Flexion 90% 90% 90%  Extension 90% 90% 90%  Right lateral flexion 75% 75% 50%  Left lateral flexion 50% 75% 50%  Right rotation 75% 90% 90%  Left rotation 90% 90% 75%   (Blank rows = not tested)  Goal status:   4.  Increase strength to 4+/5 in deficit regions Baseline:  MMT Right eval Left eval B 08/10/23 B 09/10/23  Shoulder flexion   4(prone) 4+  Shoulder extension      Shoulder abduction      Shoulder adduction      Shoulder extension      Shoulder internal rotation      Shoulder external rotation      Middle trapezius 4 4 4  4+  Lower trapezius 4 4 4  4+   Goal status: Met     PLAN:  PT FREQUENCY: 2x/week  PT DURATION: 6 weeks  PLANNED INTERVENTIONS: 97164- PT Re-evaluation, 97110-Therapeutic exercises, 97530- Therapeutic activity, 97112- Neuromuscular re-education, 97535- Self Care, 82956- Manual therapy, Patient/Family education, Dry Needling, Joint mobilization, and Spinal mobilization  PLAN FOR NEXT SESSION: HEP review and update, manual techniques as appropriate, aerobic tasks, ROM and flexibility activities, strengthening and PREs, TPDN, gait and balance training as needed     Edwina Gram PT  09/10/23 8:57 AM Phone: [phone] Fax: [phone]

## 2023-09-10 ENCOUNTER — Ambulatory Visit: Attending: Family Medicine

## 2023-09-10 DIAGNOSIS — R293 Abnormal posture: Secondary | ICD-10-CM | POA: Insufficient documentation

## 2023-09-10 DIAGNOSIS — M546 Pain in thoracic spine: Secondary | ICD-10-CM | POA: Insufficient documentation

## 2023-09-10 DIAGNOSIS — M6281 Muscle weakness (generalized): Secondary | ICD-10-CM | POA: Insufficient documentation

## 2023-09-13 ENCOUNTER — Other Ambulatory Visit: Payer: Self-pay | Admitting: Internal Medicine

## 2023-09-13 NOTE — Therapy (Deleted)
 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   Past Surgical History:  Procedure Laterality Date   DIAGNOSTIC LAPAROSCOPY     MASTECTOMY W/ SENTINEL NODE BIOPSY Bilateral 09/16/2018   Procedure: RIGHT MASTECTOMY WITH RIGHT AXILLARY SENTINEL LYMPH NODE BIOPSY AND LEFT RISK REDUCING MASTECTOMY;  Surgeon: Enid Harry, MD;  Location: [facility];  Service: General;  Laterality: Bilateral;   Patient Active Problem List   Diagnosis Date Noted   Family history of cardiovascular disorder 09/12/2022   Breast cancer, right (HCC) 09/16/2018   Genetic testing 09/09/2018   Malignant neoplasm of upper-inner quadrant of right breast in female, estrogen receptor positive (HCC) 08/19/2018   Family history of breast cancer    History of penicillin allergy 09/24/2012   Exposure to strep throat 09/24/2012   Perioral dermatitis 09/24/2012   Left otitis media with effusion 08/16/2011   Hx of antibiotic allergy 05/03/2011   Disorder of teeth and supporting structures 03/30/2008   Allergic rhinitis 02/17/2008   Endometriosis 02/17/2008   Headache 02/17/2008   Hx gestational diabetes 02/17/2008   POSITIVE PPD 12/17/2007    PCP: Reginal Capra, MD   REFERRING PROVIDER: Marquetta Sit, MD  REFERRING DIAG: M54.9 (ICD-10-CM) - Upper back pain   THERAPY DIAG:  No diagnosis found.  Rationale for Evaluation and Treatment: Rehabilitation  ONSET DATE: chronic  SUBJECTIVE:                                                                                                                                                                                                          SUBJECTIVE STATEMENT: Feels she has some minor twinges but continues to note symptoms.   Hand dominance: Right  PERTINENT HISTORY:   upper back pain.  Musculoskeletal.  Has benefited from PT in the past and requesting repeat referral.  Referral is placed.   PAIN:  Are you having pain? Yes: NPRS scale: 0/10 Pain location: B scapular regions/flank Pain description: dull/achy Aggravating factors: bending, twisting Relieving  factors: position changes  PRECAUTIONS: None  RED FLAGS: None     WEIGHT BEARING RESTRICTIONS: No  FALLS:  Has patient fallen in last 6 months? No  OCCUPATION: office admin  PLOF: Independent  PATIENT GOALS: To manage my back pain  NEXT MD VISIT: TBD  OBJECTIVE:  Note: Objective measures were completed at Evaluation unless otherwise noted.  DIAGNOSTIC FINDINGS:  none  PATIENT SURVEYS:   ODI: 4/50  POSTURE: rounded shoulders, forward head, and increased thoracic kyphosis  PALPATION: TTP B rhomboids R>L  CERVICAL ROM:   Active ROM A/PROM (deg) eval 08/10/23 09/10/23  Flexion 90% 90% 90%  Extension 90% 90% 90%  Right lateral flexion 75% 75% 50%  Left lateral flexion 50% 75% 50%  Right rotation 75% 90% 90%  Left rotation 90% 90% 75%   (Blank rows = not tested)  UPPER EXTREMITY ROM: WNL B  Active ROM Right eval Left eval  Shoulder flexion    Shoulder extension    Shoulder abduction    Shoulder adduction    Shoulder extension    Shoulder internal rotation    Shoulder external rotation    Elbow flexion    Elbow extension    Wrist flexion    Wrist extension    Wrist ulnar deviation    Wrist radial deviation    Wrist pronation    Wrist supination     (Blank rows = not tested)  UPPER EXTREMITY MMT:  MMT Right eval Left eval B 08/10/23 B 09/10/23  Shoulder flexion    4(prone) 4+  Shoulder extension      Shoulder abduction      Shoulder adduction      Shoulder extension      Shoulder internal rotation      Shoulder external rotation      Middle trapezius 4 4 4  4+  Lower trapezius 4 4 4  4+  Elbow flexion      Elbow extension      Wrist flexion      Wrist extension      Wrist ulnar deviation      Wrist radial deviation      Wrist pronation      Wrist supination      Grip strength       (Blank rows = not tested)  CERVICAL SPECIAL TESTS:  Neck flexor muscle endurance test: Negative and Spurling's test: Negative  FUNCTIONAL TESTS:  30 seconds chair stand test n/a  TREATMENT DATE:  [facility] Adult PT Treatment:                                                DATE: 09/10/23 Therapeutic Exercise: UBE L2 3/3/ min Self Care: Assessment of goal progress and recommendations for continued care outside of clinic.  Patient will be fitted for posture conforming bra following B mastectomies which she should have been wearing post-op.   [facility] Adult PT Treatment:                                                DATE: 08/20/23 Therapeutic Exercise: Nustep L4 8 min Neuromuscular re-ed: Bird dog 10/10 Quadruped thoracic rotation 10/10 Prone flexion 2# 15x Prone extension 2# 15x Prone hor abd 2# 15x  Prone Ws 1#15x Prone Ys 1# 15x (Physioball used for all prone tasks) Therapeutic Activity: Omega high row 25# 15x2 Omega low row 25# 15x2 Omega lat pulldown 25# 15x2  [facility] Adult PT Treatment:                                                DATE: 08/17/23 Therapeutic Exercise: Nustep L4 8 min Neuromuscular re-ed: Bird dog 10/10 Quadruped thoracic rotation 10/10 Prone flexion 1# 15x Prone extension 1# 15x Prone hor abd 1# 15x Prone Ws 1#15x Prone Ys 1# 15x (Physioball used for all prone tasks) Therapeutic Activity: Omega high row 25# 15x2 Omega low row 25# 15x2 Omega lat pulldown 25# 15x2  [facility] Adult PT Treatment:                                                 DATE: 08/13/23 Therapeutic Exercise: Nustep L3 8 min Neuromuscular re-ed: Bird dog 10/10 Quadruped thoracic rotation 10/10 Prone flexion 1# 15x Prone extension 1# 15x Prone hor abd 1# 15x Prone Ws 1#15x Prone Ys 1# 15x Therapeutic Activity: Omega high row 25# 15x2 Omega low row 25# 15x2 Omega lat pulldown 25# 15x2     [facility] Adult PT Treatment:                                                DATE: 07/09/22 Eval and HEP Self Care: Additional minutes spent for educating on updated Therapeutic Home Exercise Program as well as comparing current status to condition at start of symptoms. This included exercises focusing on stretching, strengthening, with focus on eccentric aspects. Long term goals include an improvement in range of motion, strength, endurance as well as avoiding reinjury. Patient's frequency would include in 1-2 times a day, 3-5 times a week for a duration of 6-12 weeks. Proper technique shown and discussed handout in great detail. All questions were discussed and addressed.                                                                                                                                 PATIENT EDUCATION:  Education details: Discussed eval findings, rehab rationale and POC and patient is in agreement  Person educated: Patient Education method: Explanation Education comprehension: verbalized understanding and needs further education  HOME EXERCISE PROGRAM: Access Code: [email or web address] URL: [email or web address] Date: 08/20/2023 Prepared by: Gretta Leavens  Exercises - Quadruped Thoracic Rotation Full Range with Hand on Neck  - 2 x daily - 5 x weekly - 1 sets - 10 reps -  Prone Scapular Retraction  - 2 x daily - 5 x weekly - 1 sets - 15 reps - Prone Shoulder Horizontal Abduction  - 2 x daily - 5 x weekly - 1 sets - 15 reps - Prone Single Arm Shoulder Y  - 2 x daily - 5 x weekly - 1 sets - 15 reps - Prone Shoulder Extension  - 2 x daily - 5 x weekly  - 1 sets - 15 reps - Prone W Scapular Retraction  - 2 x daily - 5 x weekly - 1 sets - 15 reps - Seated Lat Stretch  - 2 x daily - 5 x weekly - 1 sets - 5 reps - Latissimus Dorsi Stretch at Wall  - 2 x daily - 5 x weekly - 1 sets - 2 reps - 30s hold  ASSESSMENT:  CLINICAL IMPRESSION: Patient seen for re-assessment of progress towards goals.  Some regression in ROM but pain resolved.  She will undergo fitting for post mastectomy prosthesis which she should have been wearing all along in an attempt to correct posture.  Patient is a 58 y.o. female who was seen today for physical therapy evaluation and treatment for chronic thoracic and flank pain. Patient presents with good shoulder mobility B.  ROM restrictions noted in cervical spine and weakness identified in posterior shoulders including rhomboids and middle traps.  Palpation finds TrPs to B rhomboids R>L.  Postural deficits of elevated and forward shoulder identified.  OBJECTIVE IMPAIRMENTS: decreased activity tolerance, decreased knowledge of condition, decreased strength, increased fascial restrictions, impaired perceived functional ability, postural dysfunction, and pain.   ACTIVITY LIMITATIONS: carrying, lifting, bending, and sitting  PERSONAL FACTORS: Age, Behavior pattern, Fitness, and Past/current experiences are also affecting patient's functional outcome.   REHAB POTENTIAL: Good  CLINICAL DECISION MAKING: Stable/uncomplicated  EVALUATION COMPLEXITY: Low   GOALS: Goals reviewed with patient? No  SHORT TERM GOALS: Target date: 07/30/2023  Patient to demonstrate independence in HEP  Baseline:  [id number] Goal status: MET   LONG TERM GOALS: Target date: 08/20/2023  Patient will score at least 2/50 on NDI to signify clinically meaningful improvement in functional abilities.   Baseline: 4/50; 09/10/23 0/10 Goal status: INITIAL  2.  Patient will acknowledge 2/10 pain at least once during episode of care   Baseline: 3/10; 09/10/23  0/10 Goal status: INITIAL  3.  Increase cervical AROM to 90% Baseline:  Active ROM A/PROM (deg) eval 08/10/23 09/10/23  Flexion 90% 90% 90%  Extension 90% 90% 90%  Right lateral flexion 75% 75% 50%  Left lateral flexion 50% 75% 50%  Right rotation 75% 90% 90%  Left rotation 90% 90% 75%   (Blank rows = not tested)  Goal status:   4.  Increase strength to 4+/5 in deficit regions Baseline:  MMT Right eval Left eval B 08/10/23 B 09/10/23  Shoulder flexion   4(prone) 4+  Shoulder extension      Shoulder abduction      Shoulder adduction      Shoulder extension      Shoulder internal rotation      Shoulder external rotation      Middle trapezius 4 4 4  4+  Lower trapezius 4 4 4  4+   Goal status: Met     PLAN:  PT FREQUENCY: 2x/week  PT DURATION: 6 weeks  PLANNED INTERVENTIONS: 97164- PT Re-evaluation, 97110-Therapeutic exercises, 97530- Therapeutic activity, 97112- Neuromuscular re-education, 97535- Self Care, 54098- Manual therapy, Patient/Family education, Dry Needling, Joint mobilization, and Spinal mobilization  PLAN FOR NEXT SESSION: HEP review and update, manual techniques as appropriate, aerobic tasks, ROM and flexibility activities, strengthening and PREs, TPDN, gait and balance training as needed     Jeff Saliou Barnier PT  09/13/23 10:41 AM Phone: [phone] Fax: [phone]

## 2023-09-14 ENCOUNTER — Ambulatory Visit

## 2023-09-14 LAB — LIPID PANEL
Cholesterol: 189 mg/dL (ref <200)
HDL: 46 mg/dL — ABNORMAL LOW (ref >=50)
LDL Cholesterol (Calc): 115 mg/dL — ABNORMAL HIGH
Non-HDL Cholesterol (Calc): 143 mg/dL — ABNORMAL HIGH (ref <130)
Total CHOL/HDL Ratio: 4.1 (calc) (ref <5.0)
Triglycerides: 168 mg/dL — ABNORMAL HIGH (ref <150)

## 2023-09-14 LAB — TSH: TSH: 1.52 m[IU]/L (ref 0.40–4.50)

## 2023-09-18 ENCOUNTER — Ambulatory Visit: Payer: Self-pay | Admitting: Family

## 2023-09-26 ENCOUNTER — Telehealth: Payer: Self-pay | Admitting: *Deleted

## 2023-09-26 NOTE — Telephone Encounter (Signed)
 Received call from pt stating she experienced a menstrual cycle on 09/12/23 after not having any sort of bleeding x 6 months.  Pt states she was seen by her Gynecologist Dr. Johnston Nao who preformed a biopsy which showed normal uterine cells but a thicker than normal uterine lining, around 10 mm.  Pt states she has been on Tamoxifen  for 5 years and would like to discuss with MD the possibility of stopping. RN contacted Dr. Almedia Jacobsen office for records and set up f/u with MD.

## 2023-09-30 NOTE — Progress Notes (Signed)
 Thyroid  is normal range , low hdl and high tg although better is sob optimal but fist those  at risk for DM  . Metabolic syndrome.   10 years risk is still low . Intensify lifestyle interventions. As possible   ( last ct calcium score is zero )  if you have chest pain other symptoms we can get cardiology to give opinion.   The 10-year ASCVD risk score (Arnett DK, et al., 2019) is: 2.4%   Values used to calculate the score:     Age: 58 years     Clincally relevant sex: Female     Is Non-Hispanic African American: No     Diabetic: No     Tobacco smoker: No     Systolic Blood Pressure: 120 mmHg     Is BP treated: No     HDL Cholesterol: 46 mg/dL     Total Cholesterol: 189 mg/dL

## 2023-10-02 ENCOUNTER — Inpatient Hospital Stay: Attending: Hematology and Oncology | Admitting: Hematology and Oncology

## 2023-10-02 ENCOUNTER — Encounter: Payer: Self-pay | Admitting: *Deleted

## 2023-10-02 ENCOUNTER — Telehealth: Payer: Self-pay | Admitting: Hematology and Oncology

## 2023-10-02 DIAGNOSIS — C50211 Malignant neoplasm of upper-inner quadrant of right female breast: Secondary | ICD-10-CM

## 2023-10-02 DIAGNOSIS — Z170 Estrogen receptor positive status [ER+]: Secondary | ICD-10-CM

## 2023-10-02 NOTE — Telephone Encounter (Signed)
 Confimed with pt scheduled phone visit date and time

## 2023-10-02 NOTE — Assessment & Plan Note (Signed)
 08/13/2018: Palpable mass at 1230 to 1 o'clock position right breast with skin indentation and focal dimpling UOQ, ultrasound to masses.  12:30 position: 2 cm mass, at 1 o'clock position: 0.9 cm mass, together 3.4 cm.  UOQ: 2 additional masses 0.7 cm and 0.7 cm, indeterminate retroareolar calcifications.   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  T1c N0 stage Ia clinical stage   09/16/2018:Bilateral mastectomies: Left mastectomy benign, right mastectomy: Invasive lobular cancer, multifocal, largest tumor 3.2 cm, grade 2, margins negative, 1/2 lymph nodes with isolated tumor cells, ER 90%, PR 70%, HER-2 -1+, Ki-67 10% T2 N0 I+ stage Ia   Recommendations: 1. Oncotype DX score: 17, risk of distant recurrence 5% at 9 years: No chemo 2. 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 --------------------------------------------------------------------------------------------------------------------------------- Tamoxifen  toxicities:  Hot flashes have gotten slightly worse than before. Fatigue and abdominal bloating Her last menstrual cycle was in October 2022. Uterine hypertrophy   Dr. Kandyce is monitoring her uterine hypertrophy.    Patient wishes to discontinue antiestrogen therapy at this time.   Breast cancer surveillance: 1.  Chest wall examination 03/29/2023: No palpable lumps or nodules of concern 2.  No role of imaging studies because she had bilateral mastectomies.

## 2023-10-02 NOTE — Progress Notes (Signed)
 Per MD request RN successfully faxed BCI orders. MD states he will contact GYN Dr. Folgeman once results are back for treatment plan if BCI shows that AI therapy is needed.

## 2023-10-02 NOTE — Progress Notes (Signed)
 HEMATOLOGY-ONCOLOGY TELEPHONE VISIT PROGRESS NOTE  I connected with our patient on 10/02/23 at 10:00 AM EDT by telephone and verified that I am speaking with the correct person using two identifiers.  I discussed the limitations, risks, security and privacy concerns of performing an evaluation and management service by telephone and the availability of in person appointments.  I also discussed with the patient that there may be a patient responsible charge related to this service. The patient expressed understanding and agreed to proceed.   History of Present Illness: Follow-up to discuss issues with tamoxifen  and uterine bleeding  History of Present Illness Megan Armstrong is a 58 year old female with breast cancer who presents with postmenopausal bleeding and concerns related to tamoxifen  use. She is accompanied by her husband.  She experienced uterine bleeding a week prior to September 19, 2023, with a previous episode in October 2024. A biopsy and dilation and curettage (D&C) showed no abnormalities, but there was thickening of the uterine lining. She has been on tamoxifen  for five years following a lateral mastectomy and stopped taking it on September 19, 2023, due to concerns about uterine hypertrophy. There is ongoing discussion about her menopause status, and she is monitoring for further bleeding.    Oncology History  Malignant neoplasm of upper-inner quadrant of right breast in female, estrogen receptor positive (HCC)  08/13/2018 Initial Diagnosis   Palpable mass at 1230 to 1 o'clock position right breast with skin indentation and focal dimpling UOQ, ultrasound to masses.  12:30 position: 2 cm mass, at 1 o'clock position: 0.9 cm mass, together 3.4 cm.  UOQ: 2 additional masses 0.7 cm and 0.7 cm, indeterminate retroareolar calcifications.   08/13/2018 Pathology Results   Right breast needle core biopsy 1230: Grade 2 ILC with LCIS, UOQ 10:30 position: Grade 2 ILC with LCIS, ER 90%, PR 30%, Ki-67 10%,  HER-2 -1+ by IHC.  T1c N0 stage Ia clinical stage   09/06/2018 Genetic Testing   Negative genetic testing on the CustomNext-Cancer+RNAinsight testing. 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  The CustomNext-Expanded gene panel offered by [facility] and includes sequencing and rearrangement analysis for the following 81 genes: AIP, ALK, APC*, ATM*, AXIN2, BAP1, BARD1, BLM, BMPR1A, BRCA1*, BRCA2*, BRIP1*, CDC73, CDH1*, CDK4, CDKN1B, CDKN2A, CHEK2*, CTNNA1, DICER1, FANCC, FH, FLCN, GALNT12, HOXB13, KIT, MAX, MEN1, MET, MLH1*, MRE11A, MSH2*, MSH6*, MUTYH*, NBN, NF1*, NF2, NTHL1, PALB2*, PDGFRA, PHOX2B, PMS2*, POLD1, POLE, POT1, PRKAR1A, PTCH1, PTEN*, RAD50, RAD51C*, RAD51D*, RB1, RET, SDHA, SDHAF2, SDHB, SDHC, SDHD, SMAD4, SMARCA4, SMARCB1, SMARCE1, STK11, SUFU, TMEM127, TP53*, TSC1, TSC2, VHL and XRCC2 (sequencing and deletion/duplication); CASR, CFTR, CPA1, CTRC, EGFR, MITF, PRSS1 and SPINK1 (sequencing only); EPCAM and GREM1 (deletion/duplication only). DNA and RNA analyses performed for * genes.  The report date is Sep 06, 2018.   09/16/2018 Surgery   Bilateral mastectomies: Left mastectomy benign, right mastectomy: Invasive lobular cancer, multifocal, largest tumor 3.2 cm, grade 2, margins negative, 1/2 lymph nodes with isolated tumor cells, ER 90%, PR 70%, HER-2 -1+, Ki-67 10% T2 N0 I+ stage Ia   09/16/2018 Oncotype testing   17/5%   09/20/2018 Cancer Staging   Staging form: Breast, AJCC 8th Edition - Pathologic stage from 09/20/2018: Stage IA (pT2, pN0(i+)(sn), cM0, G2, ER+, PR+, HER2-) - Signed by Odean Potts, MD on 09/20/2018   09/2018 -  Anti-estrogen oral therapy   Tamoxifen  daily     REVIEW OF SYSTEMS:   Constitutional: Denies fevers, chills or abnormal weight loss All other systems were reviewed with  the patient and are negative. Observations/Objective:     Assessment Plan:  Malignant neoplasm of upper-inner quadrant of right breast in female, estrogen receptor positive  (HCC) 08/13/2018: Palpable mass at 1230 to 1 o'clock position right breast with skin indentation and focal dimpling UOQ, ultrasound to masses.  12:30 position: 2 cm mass, at 1 o'clock position: 0.9 cm mass, together 3.4 cm.  UOQ: 2 additional masses 0.7 cm and 0.7 cm, indeterminate retroareolar calcifications.   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  T1c N0 stage Ia clinical stage   09/16/2018:Bilateral mastectomies: Left mastectomy benign, right mastectomy: Invasive lobular cancer, multifocal, largest tumor 3.2 cm, grade 2, margins negative, 1/2 lymph nodes with isolated tumor cells, ER 90%, PR 70%, HER-2 -1+, Ki-67 10% T2 N0 I+ stage Ia   Recommendations: 1. Oncotype DX score: 17, risk of distant recurrence 5% at 9 years: No chemo 2. 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 --------------------------------------------------------------------------------------------------------------------------------- Tamoxifen  toxicities:  Hot flashes have gotten slightly worse than before. 2. Fatigue and abdominal bloating 3. Her last menstrual cycle was in October 2022. 4. Uterine hypertrophy with bleeding   Dr. Kandyce is monitoring her uterine hypertrophy.      Breast cancer surveillance: 1.  Chest wall examination 03/29/2023: No palpable lumps or nodules of concern 2.  No role of imaging studies because she had bilateral mastectomies.  Will obtain BCI to determine if she would benefit from extended endocrine therapy.  If there is no benefit then we will discontinue tamoxifen  and be done.  However if there is a benefit to external endocrine therapy then we will discuss the role of aromatase inhibitors.  I will then discuss with Dr. Kandyce with our plan so that she could continue to monitor her  uterine hypertrophy issues. 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 Tamoxifen  discontinued on September 19, 2023. BCI test will determine recurrence risk and need for further therapy. Alternatives considered if further therapy needed. - Order BCI test to assess need for continued antiestrogen therapy. - Instruct her to remain off tamoxifen  until BCI test results are available. - Communicate with Dr. Babetta regarding management plan after BCI results are available.      I discussed the assessment and treatment plan with the patient. The patient was provided an opportunity to ask questions and all were answered. The patient agreed with the plan and demonstrated an understanding of the instructions. The patient was advised to call back or seek an in-person evaluation if the symptoms worsen or if the condition fails to improve as anticipated.   I provided 20 minutes of non-face-to-face time during this encounter.  This includes time for charting and coordination of care   Naomi MARLA Chad, MD

## 2023-10-03 ENCOUNTER — Encounter: Payer: Self-pay | Admitting: Obstetrics and Gynecology

## 2023-10-29 NOTE — Assessment & Plan Note (Signed)
 08/13/2018: Palpable mass at 1230 to 1 o'clock position right breast with skin indentation and focal dimpling UOQ, ultrasound to masses.  12:30 position: 2 cm mass, at 1 o'clock position: 0.9 cm mass, together 3.4 cm.  UOQ: 2 additional masses 0.7 cm and 0.7 cm, indeterminate retroareolar calcifications.   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  T1c N0 stage Ia clinical stage   09/16/2018:Bilateral mastectomies: Left mastectomy benign, right mastectomy: Invasive lobular cancer, multifocal, largest tumor 3.2 cm, grade 2, margins negative, 1/2 lymph nodes with isolated tumor cells, ER 90%, PR 70%, HER-2 -1+, Ki-67 10% T2 N0 I+ stage Ia   Recommendations: 1. Oncotype DX score: 17, risk of distant recurrence 5% at 9 years: No chemo 2. 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 --------------------------------------------------------------------------------------------------------------------------------- Tamoxifen  toxicities:  Hot flashes have gotten slightly worse than before. 2. Fatigue and abdominal bloating 3. Her last menstrual cycle was in October 2022. 4. Uterine hypertrophy with bleeding   Dr. Kandyce is monitoring her uterine hypertrophy.      Breast cancer surveillance: 1.  Chest wall examination 03/29/2023: No palpable lumps or nodules of concern 2.  No role of imaging studies because she had bilateral mastectomies.   Will obtain BCI

## 2023-10-30 ENCOUNTER — Inpatient Hospital Stay: Attending: Hematology and Oncology | Admitting: Hematology and Oncology

## 2023-10-30 DIAGNOSIS — Z170 Estrogen receptor positive status [ER+]: Secondary | ICD-10-CM | POA: Diagnosis not present

## 2023-10-30 DIAGNOSIS — C50211 Malignant neoplasm of upper-inner quadrant of right female breast: Secondary | ICD-10-CM

## 2023-10-30 NOTE — Progress Notes (Signed)
 HEMATOLOGY-ONCOLOGY TELEPHONE VISIT PROGRESS NOTE  I connected with our patient on 10/30/23 at  9:45 AM EDT by telephone and verified that I am speaking with the correct person using two identifiers.  I discussed the limitations, risks, security and privacy concerns of performing an evaluation and management service by telephone and the availability of in person appointments.  I also discussed with the patient that there may be a patient responsible charge related to this service. The patient expressed understanding and agreed to proceed.   History of Present Illness: Follow-up to discuss symptoms after stopping tamoxifen   History of Present Illness Megan Armstrong is a 58 year old female with breast cancer who presents for follow-up regarding her tamoxifen .  She stopped tamoxifen  therapy in June and reports that the hot flashes have gotten better.  The question is whether or not she needs extended antiestrogen therapy.  She has completed the financial arrangements for the Breast Cancer Index test, which is currently in process. She is awaiting confirmation via email regarding the test's progression and results.  She experiences ongoing menstrual cycles, which are monthly and relatively regular. She had a long break from October 2023 to June 2025 without a cycle, followed by the return of monthly cycles. A biopsy performed in late June 2025 returned normal results. She wants to avoid frequent biopsies.    Oncology History  Malignant neoplasm of upper-inner quadrant of right breast in female, estrogen receptor positive (HCC)  08/13/2018 Initial Diagnosis   Palpable mass at 1230 to 1 o'clock position right breast with skin indentation and focal dimpling UOQ, ultrasound to masses.  12:30 position: 2 cm mass, at 1 o'clock position: 0.9 cm mass, together 3.4 cm.  UOQ: 2 additional masses 0.7 cm and 0.7 cm, indeterminate retroareolar calcifications.   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  T1c N0 stage Ia clinical stage   09/06/2018 Genetic Testing   Negative genetic testing on the CustomNext-Cancer+RNAinsight testing. 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  The CustomNext-Expanded gene panel offered by [facility] and includes sequencing and rearrangement analysis for the following 81 genes: AIP, ALK, APC*, ATM*, AXIN2, BAP1, BARD1, BLM, BMPR1A, BRCA1*, BRCA2*, BRIP1*, CDC73, CDH1*, CDK4, CDKN1B, CDKN2A, CHEK2*, CTNNA1, DICER1, FANCC, FH, FLCN, GALNT12, HOXB13, KIT, MAX, MEN1, MET, MLH1*, MRE11A, MSH2*, MSH6*, MUTYH*, NBN, NF1*, NF2, NTHL1, PALB2*, PDGFRA, PHOX2B, PMS2*, POLD1, POLE, POT1, PRKAR1A, PTCH1, PTEN*, RAD50, RAD51C*, RAD51D*, RB1, RET, SDHA, SDHAF2, SDHB, SDHC, SDHD, SMAD4, SMARCA4, SMARCB1, SMARCE1, STK11, SUFU, TMEM127, TP53*, TSC1, TSC2, VHL and XRCC2 (sequencing and deletion/duplication); CASR, CFTR, CPA1, CTRC, EGFR, MITF, PRSS1 and SPINK1 (sequencing only); EPCAM and GREM1 (deletion/duplication only). DNA and RNA analyses performed for * genes.  The report date is Sep 06, 2018.   09/16/2018 Surgery   Bilateral mastectomies: Left mastectomy benign, right mastectomy: Invasive lobular cancer, multifocal, largest tumor 3.2 cm, grade 2, margins negative, 1/2 lymph nodes with isolated tumor cells, ER 90%, PR 70%, HER-2 -1+, Ki-67 10% T2 N0 I+ stage Ia   09/16/2018 Oncotype testing   17/5%   09/20/2018 Cancer Staging   Staging form: Breast, AJCC 8th Edition - Pathologic stage from 09/20/2018: Stage IA (pT2, pN0(i+)(sn), cM0, G2, ER+, PR+, HER2-) - Signed by Odean Potts, MD on 09/20/2018   09/2018 -  Anti-estrogen oral therapy   Tamoxifen  daily     REVIEW  OF SYSTEMS:   Constitutional: Denies fevers, chills or abnormal weight loss All other systems were reviewed with the patient and are negative. Observations/Objective:     Assessment  Plan:  Malignant neoplasm of upper-inner quadrant of right breast in female, estrogen receptor positive (HCC) 08/13/2018: Palpable mass at 1230 to 1 o'clock position right breast with skin indentation and focal dimpling UOQ, ultrasound to masses.  12:30 position: 2 cm mass, at 1 o'clock position: 0.9 cm mass, together 3.4 cm.  UOQ: 2 additional masses 0.7 cm and 0.7 cm, indeterminate retroareolar calcifications.   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  T1c N0 stage Ia clinical stage   09/16/2018:Bilateral mastectomies: Left mastectomy benign, right mastectomy: Invasive lobular cancer, multifocal, largest tumor 3.2 cm, grade 2, margins negative, 1/2 lymph nodes with isolated tumor cells, ER 90%, PR 70%, HER-2 -1+, Ki-67 10% T2 N0 I+ stage Ia   Recommendations: 1. Oncotype DX score: 17, risk of distant recurrence 5% at 9 years: No chemo 2. 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 --------------------------------------------------------------------------------------------------------------------------------- Tamoxifen  toxicities:  Hot flashes have gotten slightly worse than before. 2. Fatigue and abdominal bloating 3. Uterine hypertrophy with bleeding   Oct 23-June 5th: No cycles and suddenly her cycles resumed and she is now having monthly cycles.  Dr. Kandyce is monitoring her uterine hypertrophy. (Biopsy was neg)    She has an appointment coming up with Dr. Kandyce we will discuss getting menopause labs like [name] and estradiol levels.   Breast cancer surveillance: 1.  Chest wall examination 03/29/2023: No palpable lumps or nodules of concern 2.  No role of imaging studies because she had bilateral mastectomies.   Will obtain BCI and  determine if she needs more anti estrogen therapy Will call her with the results of BCI Assessment & Plan Malignant neoplasm of upper-inner quadrant of right breast Estrogen receptor positive breast cancer. BCI test conducted to evaluate treatment necessity and potential adjustments. - Follow up on BCI test results and communicate findings. - Discuss treatment adjustments based on BCI results, including possible dose reduction.  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 Normal biopsy results. Further hormonal evaluation may be needed. - Coordinate with Dr. Babetta for potential FSH, estradiol, and LH testing. - Schedule annual Pap smear with Dr. Babetta and discuss hormonal testing.      I discussed the assessment and treatment plan with the patient. The patient was provided an opportunity to ask questions and all were answered. The patient agreed with the plan and demonstrated an understanding of the instructions. The patient was advised to call back or seek an in-person evaluation if the symptoms worsen or if the condition fails to improve as anticipated.   I provided 20 minutes of non-face-to-face time during this encounter.  This includes time for charting and coordination of care   Naomi MARLA Chad, MD

## 2023-11-06 ENCOUNTER — Telehealth: Payer: Self-pay | Admitting: Hematology and Oncology

## 2023-11-06 NOTE — Telephone Encounter (Signed)
 I informed the patient that BCI came back as Low risk (2.5% risk over 10 years) and that there is no benefit for extended endocrine therapy She can stop Tamoxifen  now. F/U as needed with us 

## 2023-11-07 ENCOUNTER — Encounter (HOSPITAL_COMMUNITY): Payer: Self-pay

## 2023-11-14 ENCOUNTER — Encounter: Payer: Self-pay | Admitting: Internal Medicine

## 2023-11-14 NOTE — Progress Notes (Unsigned)
[facility]

## 2023-11-20 ENCOUNTER — Encounter: Payer: Self-pay | Admitting: Obstetrics

## 2024-04-08 ENCOUNTER — Telehealth: Payer: Self-pay | Admitting: Internal Medicine

## 2024-04-08 NOTE — Telephone Encounter (Signed)
 Patient dropped off document Wellness Screening form, to be filled out by provider. Patient requested to send it back via Fax within 7-days. Document is located in providers tray at front office.Please advise at fax [phone], patient would like to pick up the original after it has been faxed.

## 2024-04-09 NOTE — Telephone Encounter (Signed)
 Form faxed. Contacted pt and made her aware of it. Pt states she will pick up the original on Friday.   Original is at front desk and ready for a pick up.
